# Patient Record
Sex: Male | Born: 1950 | Race: Black or African American | Hispanic: No | Marital: Single | Smoking: Former smoker
Health system: Southern US, Community
[De-identification: ages and names within clinical notes are randomized; demographics above are authoritative.]

## PROBLEM LIST (undated history)

## (undated) DIAGNOSIS — R188 Other ascites: Secondary | ICD-10-CM

## (undated) DIAGNOSIS — K922 Gastrointestinal hemorrhage, unspecified: Secondary | ICD-10-CM

## (undated) DIAGNOSIS — K219 Gastro-esophageal reflux disease without esophagitis: Secondary | ICD-10-CM

## (undated) DIAGNOSIS — I82890 Acute embolism and thrombosis of other specified veins: Secondary | ICD-10-CM

## (undated) DIAGNOSIS — F1020 Alcohol dependence, uncomplicated: Secondary | ICD-10-CM

## (undated) DIAGNOSIS — K7460 Unspecified cirrhosis of liver: Secondary | ICD-10-CM

## (undated) DIAGNOSIS — M25461 Effusion, right knee: Secondary | ICD-10-CM

## (undated) DIAGNOSIS — E119 Type 2 diabetes mellitus without complications: Secondary | ICD-10-CM

## (undated) DIAGNOSIS — Z8711 Personal history of peptic ulcer disease: Secondary | ICD-10-CM

## (undated) DIAGNOSIS — K56609 Unspecified intestinal obstruction, unspecified as to partial versus complete obstruction: Secondary | ICD-10-CM

## (undated) DIAGNOSIS — K635 Polyp of colon: Secondary | ICD-10-CM

## (undated) DIAGNOSIS — M1990 Unspecified osteoarthritis, unspecified site: Secondary | ICD-10-CM

## (undated) DIAGNOSIS — D649 Anemia, unspecified: Secondary | ICD-10-CM

## (undated) DIAGNOSIS — K859 Acute pancreatitis without necrosis or infection, unspecified: Secondary | ICD-10-CM

## (undated) DIAGNOSIS — Z8719 Personal history of other diseases of the digestive system: Secondary | ICD-10-CM

## (undated) DIAGNOSIS — I10 Essential (primary) hypertension: Secondary | ICD-10-CM

## (undated) HISTORY — PX: COLONOSCOPY: SHX174

## (undated) HISTORY — DX: Type 2 diabetes mellitus without complications: E11.9

---

## 1997-10-30 ENCOUNTER — Emergency Department (HOSPITAL_COMMUNITY): Admission: EM | Admit: 1997-10-30 | Discharge: 1997-10-30 | Payer: Self-pay | Admitting: Emergency Medicine

## 2005-11-04 DIAGNOSIS — K56609 Unspecified intestinal obstruction, unspecified as to partial versus complete obstruction: Secondary | ICD-10-CM

## 2005-11-04 HISTORY — DX: Unspecified intestinal obstruction, unspecified as to partial versus complete obstruction: K56.609

## 2005-11-14 ENCOUNTER — Inpatient Hospital Stay (HOSPITAL_COMMUNITY): Admission: EM | Admit: 2005-11-14 | Discharge: 2005-11-17 | Payer: Self-pay | Admitting: Emergency Medicine

## 2005-11-14 ENCOUNTER — Ambulatory Visit: Payer: Self-pay | Admitting: Family Medicine

## 2011-07-08 DIAGNOSIS — K7460 Unspecified cirrhosis of liver: Secondary | ICD-10-CM

## 2011-07-08 HISTORY — DX: Unspecified cirrhosis of liver: K74.60

## 2011-11-05 DIAGNOSIS — K859 Acute pancreatitis without necrosis or infection, unspecified: Secondary | ICD-10-CM

## 2011-11-05 HISTORY — DX: Acute pancreatitis without necrosis or infection, unspecified: K85.90

## 2011-11-22 ENCOUNTER — Encounter (HOSPITAL_COMMUNITY): Payer: Self-pay | Admitting: *Deleted

## 2011-11-22 ENCOUNTER — Emergency Department (HOSPITAL_COMMUNITY): Payer: Self-pay

## 2011-11-22 ENCOUNTER — Inpatient Hospital Stay (HOSPITAL_COMMUNITY)
Admission: EM | Admit: 2011-11-22 | Discharge: 2011-11-24 | DRG: 439 | Disposition: A | Discharge status: Home / Self Care | Payer: Self-pay | Attending: Internal Medicine | Admitting: Internal Medicine

## 2011-11-22 DIAGNOSIS — R7309 Other abnormal glucose: Secondary | ICD-10-CM | POA: Diagnosis present

## 2011-11-22 DIAGNOSIS — F1010 Alcohol abuse, uncomplicated: Secondary | ICD-10-CM | POA: Diagnosis present

## 2011-11-22 DIAGNOSIS — R1013 Epigastric pain: Secondary | ICD-10-CM

## 2011-11-22 DIAGNOSIS — K859 Acute pancreatitis without necrosis or infection, unspecified: Principal | ICD-10-CM | POA: Diagnosis present

## 2011-11-22 DIAGNOSIS — R1012 Left upper quadrant pain: Secondary | ICD-10-CM | POA: Diagnosis present

## 2011-11-22 DIAGNOSIS — E876 Hypokalemia: Secondary | ICD-10-CM | POA: Diagnosis present

## 2011-11-22 DIAGNOSIS — K863 Pseudocyst of pancreas: Secondary | ICD-10-CM | POA: Diagnosis present

## 2011-11-22 DIAGNOSIS — F17200 Nicotine dependence, unspecified, uncomplicated: Secondary | ICD-10-CM | POA: Diagnosis present

## 2011-11-22 DIAGNOSIS — D649 Anemia, unspecified: Secondary | ICD-10-CM | POA: Diagnosis present

## 2011-11-22 DIAGNOSIS — E871 Hypo-osmolality and hyponatremia: Secondary | ICD-10-CM | POA: Diagnosis present

## 2011-11-22 DIAGNOSIS — K862 Cyst of pancreas: Secondary | ICD-10-CM | POA: Diagnosis present

## 2011-11-22 DIAGNOSIS — R109 Unspecified abdominal pain: Secondary | ICD-10-CM | POA: Diagnosis present

## 2011-11-22 HISTORY — DX: Personal history of other diseases of the digestive system: Z87.19

## 2011-11-22 HISTORY — DX: Personal history of peptic ulcer disease: Z87.11

## 2011-11-22 LAB — LIPASE, BLOOD: Lipase: 670 U/L — ABNORMAL HIGH (ref 11–59)

## 2011-11-22 LAB — DIFFERENTIAL
Basophils Absolute: 0 10*3/uL (ref 0.0–0.1)
Lymphs Abs: 2 10*3/uL (ref 0.7–4.0)
Monocytes Absolute: 1 10*3/uL (ref 0.1–1.0)
Monocytes Relative: 10 % (ref 3–12)
Neutrophils Relative %: 68 % (ref 43–77)

## 2011-11-22 LAB — CBC
HCT: 42 % (ref 39.0–52.0)
Hemoglobin: 14.6 g/dL (ref 13.0–17.0)
MCH: 32.4 pg (ref 26.0–34.0)
MCV: 93.1 fL (ref 78.0–100.0)
WBC: 9.5 10*3/uL (ref 4.0–10.5)

## 2011-11-22 LAB — MAGNESIUM: Magnesium: 2.4 mg/dL (ref 1.5–2.5)

## 2011-11-22 LAB — URINALYSIS, ROUTINE W REFLEX MICROSCOPIC
Nitrite: NEGATIVE
Protein, ur: 30 mg/dL — AB
Urobilinogen, UA: 1 mg/dL (ref 0.0–1.0)

## 2011-11-22 LAB — COMPREHENSIVE METABOLIC PANEL
ALT: 29 U/L (ref 0–53)
CO2: 30 mEq/L (ref 19–32)
Chloride: 89 mEq/L — ABNORMAL LOW (ref 96–112)
Creatinine, Ser: 1.02 mg/dL (ref 0.50–1.35)
Glucose, Bld: 173 mg/dL — ABNORMAL HIGH (ref 70–99)
Potassium: 3.2 mEq/L — ABNORMAL LOW (ref 3.5–5.1)
Sodium: 133 mEq/L — ABNORMAL LOW (ref 135–145)
Total Protein: 7.7 g/dL (ref 6.0–8.3)

## 2011-11-22 LAB — PROTIME-INR: Prothrombin Time: 14.6 seconds (ref 11.6–15.2)

## 2011-11-22 LAB — URINE MICROSCOPIC-ADD ON

## 2011-11-22 LAB — APTT: aPTT: 34 seconds (ref 24–37)

## 2011-11-22 MED ORDER — SODIUM CHLORIDE 0.9 % IV SOLN: INTRAVENOUS | Status: DC

## 2011-11-22 MED ORDER — POTASSIUM CHLORIDE 10 MEQ/100ML IV SOLN
10.0000 meq | INTRAVENOUS | Status: AC
  Administered 2011-11-22 (×2): 10 meq via INTRAVENOUS
  Filled 2011-11-22 (×2): qty 100

## 2011-11-22 MED ORDER — PNEUMOCOCCAL VAC POLYVALENT 25 MCG/0.5ML IJ INJ
0.5000 mL | INJECTION | INTRAMUSCULAR | Status: AC
  Administered 2011-11-23: 0.5 mL via INTRAMUSCULAR
  Filled 2011-11-22: qty 0.5

## 2011-11-22 MED ORDER — ONDANSETRON HCL 4 MG PO TABS: 4.0000 mg | ORAL_TABLET | Freq: Four times a day (QID) | ORAL | Status: DC | PRN

## 2011-11-22 MED ORDER — ENOXAPARIN SODIUM 40 MG/0.4ML ~~LOC~~ SOLN
40.0000 mg | SUBCUTANEOUS | Status: DC
  Administered 2011-11-22 – 2011-11-23 (×2): 40 mg via SUBCUTANEOUS
  Filled 2011-11-22 (×3): qty 0.4

## 2011-11-22 MED ORDER — IOHEXOL 300 MG/ML  SOLN
100.0000 mL | Freq: Once | INTRAMUSCULAR | Status: AC | PRN
  Administered 2011-11-22: 100 mL via INTRAVENOUS

## 2011-11-22 MED ORDER — HYDROMORPHONE HCL PF 1 MG/ML IJ SOLN
1.0000 mg | Freq: Once | INTRAMUSCULAR | Status: AC
  Administered 2011-11-22: 1 mg via INTRAVENOUS
  Filled 2011-11-22: qty 1

## 2011-11-22 MED ORDER — SODIUM CHLORIDE 0.9 % IV SOLN
INTRAVENOUS | Status: DC
  Administered 2011-11-22: 125 mL via INTRAVENOUS

## 2011-11-22 MED ORDER — SODIUM CHLORIDE 0.9 % IV SOLN
INTRAVENOUS | Status: DC
  Administered 2011-11-22 – 2011-11-24 (×5): via INTRAVENOUS

## 2011-11-22 MED ORDER — ONDANSETRON HCL 4 MG/2ML IJ SOLN
4.0000 mg | Freq: Once | INTRAMUSCULAR | Status: AC
  Administered 2011-11-22: 4 mg via INTRAVENOUS
  Filled 2011-11-22: qty 2

## 2011-11-22 MED ORDER — HYDROMORPHONE HCL PF 1 MG/ML IJ SOLN
1.0000 mg | INTRAMUSCULAR | Status: DC | PRN
  Administered 2011-11-22: 1 mg via INTRAVENOUS
  Filled 2011-11-22: qty 1

## 2011-11-22 MED ORDER — ONDANSETRON HCL 4 MG/2ML IJ SOLN: 4.0000 mg | Freq: Four times a day (QID) | INTRAMUSCULAR | Status: DC | PRN

## 2011-11-22 MED ORDER — POTASSIUM CHLORIDE 10 MEQ/100ML IV SOLN
10.0000 meq | INTRAVENOUS | Status: AC
  Administered 2011-11-22 (×6): 10 meq via INTRAVENOUS
  Filled 2011-11-22 (×6): qty 100

## 2011-11-22 MED ORDER — ENOXAPARIN SODIUM 30 MG/0.3ML ~~LOC~~ SOLN: 30.0000 mg | SUBCUTANEOUS | Status: DC

## 2011-11-22 MED ORDER — HYDROCODONE-ACETAMINOPHEN 5-325 MG PO TABS
1.0000 | ORAL_TABLET | ORAL | Status: DC | PRN
  Administered 2011-11-23 – 2011-11-24 (×2): 1 via ORAL
  Filled 2011-11-22 (×2): qty 1

## 2011-11-22 MED ORDER — SODIUM CHLORIDE 0.9 % IV BOLUS (SEPSIS)
1000.0000 mL | Freq: Once | INTRAVENOUS | Status: AC
  Administered 2011-11-22: 1000 mL via INTRAVENOUS

## 2011-11-22 MED ORDER — ONDANSETRON HCL 4 MG/2ML IJ SOLN: 4.0000 mg | Freq: Three times a day (TID) | INTRAMUSCULAR | Status: DC | PRN

## 2011-11-22 MED ORDER — HYDROMORPHONE HCL PF 1 MG/ML IJ SOLN
1.0000 mg | INTRAMUSCULAR | Status: DC | PRN
  Administered 2011-11-22 (×2): 1 mg via INTRAVENOUS
  Filled 2011-11-22 (×3): qty 1

## 2011-11-22 NOTE — ED Notes (Signed)
Attempt to call report to 5E, available RN not able to take report at this time

## 2011-11-22 NOTE — H&P (Signed)
PCP:  No primary provider on file.   DOA:  11/22/2011  7:41 AM  Chief Complaint:  Abdominal pain  HPI: Pt is 61 yo male who presents to [facility] ED with main concern of progressively worsening, sharp, intermittent, mostly in the LUQ area abdominal pain that initially started 3 - 4 days prior to admission. Pt reports the pain is 7/10 in severity and radiating to the back area, with no specific aggravating or alleviating factors. Pt denies any fever, chills, urinary concerns. He does endorse associated nausea and intermittent episodes of non blood vomiting, poor oral intake. He denies diarrhea or blood in urine or stool. Pt denies any chest pain or shortness of breath, recent sicknesses or hospitalizations, no similar episodes in the recent past, no other systemic symptoms such as weigh loss or gain, night sweats, no recent sick contacts or exposures. Denies use of alcohol.  Allergies: No Known Allergies  Prior to Admission medications   Medication Sig Start Date End Date Taking? Authorizing Provider  magnesium citrate 1.745 GM/30ML SOLN Take 1 Bottle by mouth once.   Yes Historical Provider, MD    Past Medical History  Diagnosis Date  . Intestine disorder     intestinal blockage  . History of stomach ulcers     History reviewed. No pertinent past surgical history.  Social History:  reports that he has been smoking Cigarettes.  He has been smoking about .5 packs per day. He has never used smokeless tobacco. He reports that he drinks alcohol. He reports that he does not use illicit drugs.  History reviewed. No pertinent family history.  Review of Systems:  Constitutional: Denies fever, chills, diaphoresis.  HEENT: Denies photophobia, eye pain, redness, hearing loss, ear pain, congestion, sore throat, rhinorrhea, sneezing, mouth sores, trouble swallowing, neck pain, neck stiffness and tinnitus.   Respiratory: Denies SOB, DOE, cough, chest tightness,  and wheezing.   Cardiovascular: Denies  chest pain, palpitations and leg swelling.  Gastrointestinal: per HPI Genitourinary: Denies dysuria, urgency, frequency, hematuria, flank pain and difficulty urinating.  Musculoskeletal: Denies myalgias, back pain, joint swelling, arthralgias and gait problem.  Skin: Denies pallor, rash and wound.  Neurological: Denies dizziness, seizures, syncope, weakness, light-headedness, numbness and headaches.  Hematological: Denies adenopathy. Easy bruising, personal or family bleeding history  Psychiatric/Behavioral: Denies suicidal ideation, mood changes, confusion, nervousness, sleep disturbance and agitation  Physical Exam:  Filed Vitals:   11/22/11 0744 11/22/11 1126 11/22/11 1200  BP: 120/78 154/83 159/90  Pulse: 78 64 65  Temp: 97.4 F (36.3 C) 98.7 F (37.1 C) 98.1 F (36.7 C)  TempSrc: Oral Oral Oral  Resp: 20 16 18   Height: 6' (1.829 m)  6' (1.829 m)  Weight: 74.844 kg (165 lb)  75 kg (165 lb 5.5 oz)  SpO2: 100% 99% 99%    Constitutional: Vital signs reviewed.  Patient is in no acute distress and cooperative with exam. Alert and oriented x3.  Head: Normocephalic and atraumatic Ear: TM normal bilaterally Mouth: no erythema or exudates, MMM Eyes: PERRL, EOMI, conjunctivae normal, No scleral icterus.  Neck: Supple, Trachea midline normal ROM, No JVD, mass, thyromegaly, or carotid bruit present.  Cardiovascular: RRR, S1 normal, S2 normal, no MRG, pulses symmetric and intact bilaterally Pulmonary/Chest: CTAB, no wheezes, rales, or rhonchi Abdominal: Soft. Tender in epigastric area, non-distended, bowel sounds are normal, no masses, organomegaly, or guarding present.  GU: no CVA tenderness Musculoskeletal: No joint deformities, erythema, or stiffness, ROM full and no nontender Ext: no edema and no cyanosis,  pulses palpable bilaterally (DP and PT) Hematology: no cervical, inginal, or axillary adenopathy.  Neurological: A&O x3, Strenght is normal and symmetric bilaterally, cranial  nerve II-XII are grossly intact, no focal motor deficit, sensory intact to light touch bilaterally.  Skin: Warm, dry and intact. No rash, cyanosis, or clubbing.  Psychiatric: Normal mood and affect. speech and behavior is normal. Judgment and thought content normal. Cognition and memory are normal.   Labs on Admission:  Results for orders placed during the hospital encounter of 11/22/11 (from the past 48 hour(s))  CBC     Status: Normal   Collection Time   11/22/11  8:07 AM      Component Value Range Comment   WBC 9.5  4.0 - 10.5 (K/uL)    RBC 4.51  4.22 - 5.81 (MIL/uL)    Hemoglobin 14.6  13.0 - 17.0 (g/dL)    HCT 04.5  40.9 - 81.1 (%)    MCV 93.1  78.0 - 100.0 (fL)    MCH 32.4  26.0 - 34.0 (pg)    MCHC 34.8  30.0 - 36.0 (g/dL)    RDW 91.4  78.2 - 95.6 (%)    Platelets 358  150 - 400 (K/uL)   DIFFERENTIAL     Status: Normal   Collection Time   11/22/11  8:07 AM      Component Value Range Comment   Neutrophils Relative 68  43 - 77 (%)    Neutro Abs 6.5  1.7 - 7.7 (K/uL)    Lymphocytes Relative 21  12 - 46 (%)    Lymphs Abs 2.0  0.7 - 4.0 (K/uL)    Monocytes Relative 10  3 - 12 (%)    Monocytes Absolute 1.0  0.1 - 1.0 (K/uL)    Eosinophils Relative 0  0 - 5 (%)    Eosinophils Absolute 0.0  0.0 - 0.7 (K/uL)    Basophils Relative 0  0 - 1 (%)    Basophils Absolute 0.0  0.0 - 0.1 (K/uL)   COMPREHENSIVE METABOLIC PANEL     Status: Abnormal   Collection Time   11/22/11  8:07 AM      Component Value Range Comment   Sodium 133 (*) 135 - 145 (mEq/L)    Potassium 3.2 (*) 3.5 - 5.1 (mEq/L)    Chloride 89 (*) 96 - 112 (mEq/L)    CO2 30  19 - 32 (mEq/L)    Glucose, Bld 173 (*) 70 - 99 (mg/dL)    BUN 15  6 - 23 (mg/dL)    Creatinine, Ser 2.13  0.50 - 1.35 (mg/dL)    Calcium 9.3  8.4 - 10.5 (mg/dL)    Total Protein 7.7  6.0 - 8.3 (g/dL)    Albumin 4.3  3.5 - 5.2 (g/dL)    AST 20  0 - 37 (U/L)    ALT 29  0 - 53 (U/L)    Alkaline Phosphatase 111  39 - 117 (U/L)    Total Bilirubin 1.5  (*) 0.3 - 1.2 (mg/dL)    GFR calc non Af Amer 78 (*) >90 (mL/min)    GFR calc Af Amer >90  >90 (mL/min)   LIPASE, BLOOD     Status: Abnormal   Collection Time   11/22/11  8:07 AM      Component Value Range Comment   Lipase 670 (*) 11 - 59 (U/L)   URINALYSIS, ROUTINE W REFLEX MICROSCOPIC     Status: Abnormal   Collection Time  11/22/11  8:14 AM      Component Value Range Comment   Color, Urine AMBER (*) YELLOW  BIOCHEMICALS MAY BE AFFECTED BY COLOR   APPearance CLOUDY (*) CLEAR     Specific Gravity, Urine 1.028  1.005 - 1.030     pH 7.0  5.0 - 8.0     Glucose, UA NEGATIVE  NEGATIVE (mg/dL)    Hgb urine dipstick NEGATIVE  NEGATIVE     Bilirubin Urine LARGE (*) NEGATIVE     Ketones, ur 15 (*) NEGATIVE (mg/dL)    Protein, ur 30 (*) NEGATIVE (mg/dL)    Urobilinogen, UA 1.0  0.0 - 1.0 (mg/dL)    Nitrite NEGATIVE  NEGATIVE     Leukocytes, UA TRACE (*) NEGATIVE    URINE MICROSCOPIC-ADD ON     Status: Normal   Collection Time   11/22/11  8:14 AM      Component Value Range Comment   Squamous Epithelial / LPF RARE  RARE     WBC, UA 0-2  <3 (WBC/hpf)    RBC / HPF 0-2  <3 (RBC/hpf)    Bacteria, UA RARE  RARE     Urine-Other MUCOUS PRESENT     LACTIC ACID, PLASMA     Status: Normal   Collection Time   11/22/11  8:26 AM      Component Value Range Comment   Lactic Acid, Venous 1.4  0.5 - 2.2 (mmol/L)     Radiological Exams on Admission:  CT abdomen and pelvis: 11/21/2011 1. Acute inflammatory process around the head of the pancreas and the second portion of the duodenum. There is fluid around the second portion of the duodenum and pancreatic head.  Diagnostic possibilities include acute on chronic pancreatitis with secondary inflammation of the duodenum, walled off perforated duodenal ulcer, and duodenal tumor.  2. 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  Assessment/Plan  Abdominal Pain - secondary too acute  pancreatitis - will admit the patient to medical floor for further evaluation and management - provide supportive care with IVF, antiemetics and analgesia as needed for adequate pain control - keep NPO for now and advance diet as tolerated - supplement electrolytes as indicated  Hypokalemia - check magnesium level and supplement as indicated  Hyponatremia - pre renal etiology  - continue IVF and check BMP in AM  Hyperglycemia - check A1C  DVT Prophylaxis - Lovenox  Code Status - Full  Education  - test results and diagnostic studies were discussed with patient  - patient verbalized the understanding - questions were answered at the bedside and contact information was provided for additional questions or concerns  Time Spent on Admission: Over 30 minutes  MAGICK-MYERS, ISKRA 11/22/2011, 4:08 PM  [facility] Pager # [phone] Main Office # [phone]

## 2011-11-22 NOTE — ED Notes (Signed)
Pt from home with reports of right middle abdominal pain and vomiting that started yesterday evening. Pt reports similar symptoms a few years ago with hospitalization for 3 days with intestinal blockage.

## 2011-11-22 NOTE — ED Provider Notes (Signed)
History     CSN: [id number]  Arrival date & time 11/22/11  0740   First MD Initiated Contact with Patient 11/22/11 [phone]      Chief Complaint  Patient presents with  . Abdominal Pain    right  . Emesis    (Consider location/radiation/quality/duration/timing/severity/associated sxs/prior treatment) HPI Comments: Patient states he has similar episode 3 years ago at which time he had an intestinal blockage requiring admission  Patient is a 61 y.o. male presenting with abdominal pain. The history is provided by the patient. No language interpreter was used.  Abdominal Pain The primary symptoms of the illness include abdominal pain, nausea and vomiting. The primary symptoms of the illness do not include fever, fatigue, shortness of breath, diarrhea or dysuria. The current episode started yesterday. The onset of the illness was gradual. The problem has been gradually worsening.  The abdominal pain began yesterday. The pain came on gradually. The abdominal pain has been gradually worsening since its onset. The abdominal pain is located in the periumbilical region. The abdominal pain does not radiate. The abdominal pain is relieved by nothing. The abdominal pain is exacerbated by vomiting.  Nausea began yesterday. The nausea is associated with eating. The nausea is exacerbated by food.  The vomiting began yesterday. Vomiting occurs 6 to 10 times per day. The emesis contains stomach contents.  Symptoms associated with the illness do not include chills, constipation, urgency, frequency or back pain.    Past Medical History  Diagnosis Date  . Intestine disorder     intestinal blockage  . History of stomach ulcers     History reviewed. No pertinent past surgical history.  History reviewed. No pertinent family history.  History  Substance Use Topics  . Smoking status: Current Everyday Smoker -- 0.5 packs/day    Types: Cigarettes  . Smokeless tobacco: Never Used  . Alcohol Use: Yes   daily      Review of Systems  Constitutional: Negative for fever, chills, activity change, appetite change and fatigue.  HENT: Negative for congestion, sore throat, rhinorrhea, neck pain and neck stiffness.   Respiratory: Negative for cough and shortness of breath.   Cardiovascular: Negative for chest pain and palpitations.  Gastrointestinal: Positive for nausea, vomiting and abdominal pain. Negative for diarrhea, constipation and blood in stool.  Genitourinary: Negative for dysuria, urgency, frequency and flank pain.  Musculoskeletal: Negative for myalgias, back pain and arthralgias.  Neurological: Negative for dizziness, weakness, light-headedness, numbness and headaches.  All other systems reviewed and are negative.    Allergies  Review of patient's allergies indicates no known allergies.  Home Medications   Current Outpatient Rx  Name Route Sig Dispense Refill  . MAGNESIUM CITRATE 1.745 GM/30ML PO SOLN Oral Take 1 Bottle by mouth once.      BP 120/78  Pulse 78  Temp(Src) 97.4 F (36.3 C) (Oral)  Resp 20  Ht 6' (1.829 m)  Wt 165 lb (74.844 kg)  BMI 22.38 kg/m2  SpO2 100%  Physical Exam  Nursing note and vitals reviewed. Constitutional: He is oriented to person, place, and time. He appears well-developed and well-nourished. No distress.  HENT:  Head: Normocephalic and atraumatic.  Mouth/Throat: Oropharynx is clear and moist.  Eyes: Conjunctivae and EOM are normal. Pupils are equal, round, and reactive to light.  Neck: Normal range of motion. Neck supple.  Cardiovascular: Normal rate, regular rhythm, normal heart sounds and intact distal pulses.  Exam reveals no gallop and no friction rub.   No murmur  heard. Pulmonary/Chest: Effort normal and breath sounds normal. No respiratory distress. He exhibits no tenderness.  Abdominal: Soft. Bowel sounds are normal. There is tenderness (periumbilical abdominal pain on palpation). There is no rebound and no guarding.    Musculoskeletal: Normal range of motion. He exhibits no tenderness.  Neurological: He is alert and oriented to person, place, and time. No cranial nerve deficit.  Skin: Skin is warm and dry. No rash noted.    ED Course  Procedures (including critical care time)  Labs Reviewed  COMPREHENSIVE METABOLIC PANEL - Abnormal; Notable for the following:    Sodium 133 (*)    Potassium 3.2 (*)    Chloride 89 (*)    Glucose, Bld 173 (*)    Total Bilirubin 1.5 (*)    GFR calc non Af Amer 78 (*)    All other components within normal limits  LIPASE, BLOOD - Abnormal; Notable for the following:    Lipase 670 (*)    All other components within normal limits  URINALYSIS, ROUTINE W REFLEX MICROSCOPIC - Abnormal; Notable for the following:    Color, Urine AMBER (*) BIOCHEMICALS MAY BE AFFECTED BY COLOR   APPearance CLOUDY (*)    Bilirubin Urine LARGE (*)    Ketones, ur 15 (*)    Protein, ur 30 (*)    Leukocytes, UA TRACE (*)    All other components within normal limits  CBC  DIFFERENTIAL  LACTIC ACID, PLASMA  URINE MICROSCOPIC-ADD ON   Ct Abdomen Pelvis W Contrast  11/22/2011  *RADIOLOGY REPORT*  Clinical Data: Abdominal pain and vomiting.  CT ABDOMEN AND PELVIS WITH CONTRAST  Technique:  Multidetector CT imaging of the abdomen and pelvis was performed following the standard protocol during bolus administration of intravenous contrast.  Contrast: OMNIPAQUE IOHEXOL 300 MG/ML  SOLN  Comparison: CT scan dated 11/13/2005  Findings: Since the prior exam the patient has developed chronic calcific pancreatitis.  There is a complex 3 cm cystic lesion in the head of the pancreas with a soft tissue component centrally.  I suspect this is a complex pseudocyst.  In addition, there is marked edema and abnormal enhancement in the second portion of the duodenum.  I suspect the patient has either has a walled off perforated duodenal ulcer, tumor, or acute on chronic pancreatitis which is causing inflammation  of the second portion of the duodenum.  There is dilatation of the pancreatic duct.  No significant biliary ductal dilatation.  Gallbladder appears normal.  No significant abnormality of the liver.  There are a few tiny cysts in the liver.  Gallbladder is normal.  Spleen, adrenal glands, and kidneys are normal except for a benign appearing 15 mm cyst in the lower pole of the right kidney.  Small bowel beyond the duodenum and the colon appear normal including the terminal ileum and appendix.  There is slight enlargement of the prostate gland.  No significant osseous abnormality.  IMPRESSION:  1.  Acute inflammatory process around the head of the pancreas and the second portion of the duodenum.  There is fluid around the second portion of the duodenum and pancreatic head.  Diagnostic possibilities include acute on chronic pancreatitis with secondary inflammation of the duodenum, walled off perforated duodenal ulcer, and duodenal tumor. 2.  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  Original Report Authenticated By: Gwynn Burly, M.D.     1. Acute pancreatitis       MDM  Acute pancreatitis with a lipase of 670. There is evidence on CT of the same. There is no evidence of intestinal obstruction. He received antiemetics, pain medication, IV fluids. There is no LFTs are abnormal therefore no ultrasound as indicated. He will be admitted for bowel rest. He will be n.p.o. and receive additional IV fluids.  Discussed with [facility]        Dayton Bailiff, MD 11/22/11 1011

## 2011-11-23 DIAGNOSIS — K859 Acute pancreatitis without necrosis or infection, unspecified: Secondary | ICD-10-CM

## 2011-11-23 DIAGNOSIS — R1013 Epigastric pain: Secondary | ICD-10-CM

## 2011-11-23 DIAGNOSIS — E876 Hypokalemia: Secondary | ICD-10-CM

## 2011-11-23 LAB — COMPREHENSIVE METABOLIC PANEL
ALT: 17 U/L (ref 0–53)
Alkaline Phosphatase: 78 U/L (ref 39–117)
CO2: 28 mEq/L (ref 19–32)
Calcium: 8.6 mg/dL (ref 8.4–10.5)
Chloride: 96 mEq/L (ref 96–112)
GFR calc Af Amer: >90 mL/min (ref >90)
GFR calc non Af Amer: >90 mL/min (ref >90)
Glucose, Bld: 81 mg/dL (ref 70–99)
Sodium: 133 mEq/L — ABNORMAL LOW (ref 135–145)
Total Bilirubin: 1 mg/dL (ref 0.3–1.2)

## 2011-11-23 LAB — CBC
Hemoglobin: 11.7 g/dL — ABNORMAL LOW (ref 13.0–17.0)
MCHC: 33.3 g/dL (ref 30.0–36.0)
MCV: 94.4 fL (ref 78.0–100.0)
Platelets: 268 10*3/uL (ref 150–400)
Platelets: 253 10*3/uL (ref 150–400)
RBC: 3.72 MIL/uL — ABNORMAL LOW (ref 4.22–5.81)
RDW: 13.4 % (ref 11.5–15.5)
WBC: 6.9 10*3/uL (ref 4.0–10.5)

## 2011-11-23 MED ORDER — POTASSIUM CHLORIDE CRYS ER 20 MEQ PO TBCR
40.0000 meq | EXTENDED_RELEASE_TABLET | Freq: Once | ORAL | Status: AC
  Administered 2011-11-23: 40 meq via ORAL
  Filled 2011-11-23: qty 2

## 2011-11-23 NOTE — Progress Notes (Signed)
Patient ID: Alexander Schmitt, male   DOB: 1950/08/11, 61 y.o.   MRN: [id number]  Assessment/Plan   Abdominal Pain  - secondary too acute pancreatitis  - advanced diet today to regular  Hypokalemia  - likely secondary to GI losses - repleted today again and we will check BMP in am  Anemia - hemoglobin dropped from ~14.6 to 11.7 - rechecked CBC to make sure this is not lab error - follow up CBC in am  Hyponatremia  - pre renal etiology   Hyperglycemia  - check A1C   DVT Prophylaxis - Lovenox   Code Status - Full   Education  - test results and diagnostic studies were discussed with patient  - patient verbalized the understanding  - questions were answered at the bedside and contact information was provided for additional questions or concerns    Subjective: No events overnight.   Objective:  Vital signs in last 24 hours:  Filed Vitals:   11/22/11 1921 11/22/11 2227 11/23/11 0643 11/23/11 1318  BP: 132/74 137/85 165/89 128/76  Pulse:  62 63 68  Temp:  98.4 F (36.9 C) 98.2 F (36.8 C) 98.8 F (37.1 C)  TempSrc:  Oral Oral Oral  Resp:  18 18 18   Height:      Weight:   75 kg (165 lb 5.5 oz)   SpO2:  95% 99% 97%    Intake/Output from previous day:   Intake/Output Summary (Last 24 hours) at 11/23/11 1715 Last data filed at 11/23/11 1253  Gross per 24 hour  Intake 1193.33 ml  Output      0 ml  Net 1193.33 ml    Physical Exam: General: Alert, awake, oriented x3, in no acute distress. HEENT: No bruits, no goiter. Moist mucous membranes, no scleral icterus, no conjunctival pallor. Heart: Regular rate and rhythm, S1/S2 +, no murmurs, rubs, gallops. Lungs: Clear to auscultation bilaterally. No wheezing, no rhonchi, no rales.  Abdomen: Soft, nontender, nondistended, positive bowel sounds. Extremities: No clubbing or cyanosis, no pitting edema,  positive pedal pulses. Neuro: Grossly nonfocal.  Lab Results:  Lab 11/23/11 1033 11/23/11 0535 11/22/11 0807  WBC  6.9 8.0 9.5  HGB 11.6* 11.7* 14.6  HCT 35.1* 35.1* 42.0  PLT 253 268 358  MCV 94.4 94.6 93.1    Lab 11/23/11 0535 11/22/11 1635 11/22/11 0807  NA 133* -- 133*  K 3.4* -- 3.2*  CL 96 -- 89*  CO2 28 -- 30  GLUCOSE 81 -- 173*  BUN 8 -- 15  CREATININE 0.82 -- 1.02  CALCIUM 8.6 -- 9.3  MG -- 2.4 --    Lab 11/22/11 1635  INR 1.12  PROTIME --    Studies/Results: Ct Abdomen Pelvis W Contrast 11/22/2011  *RADIOLOGY REPORT*  Clinical Data: Abdominal pain and vomiting.  CT ABDOMEN AND PELVIS WITH CONTRAST  Technique:  Multidetector CT imaging of the abdomen and pelvis was performed following the standard protocol during bolus administration of intravenous contrast.  Contrast: OMNIPAQUE IOHEXOL 300 MG/ML  SOLN  Comparison: CT scan dated 11/13/2005  Findings: Since the prior exam the patient has developed chronic calcific pancreatitis.  There is a complex 3 cm cystic lesion in the head of the pancreas with a soft tissue component centrally.  I suspect this is a complex pseudocyst.  In addition, there is marked edema and abnormal enhancement in the second portion of the duodenum.  I suspect the patient has either has a walled off perforated duodenal ulcer, tumor, or acute  on chronic pancreatitis which is causing inflammation of the second portion of the duodenum.  There is dilatation of the pancreatic duct.  No significant biliary ductal dilatation.  Gallbladder appears normal.  No significant abnormality of the liver.  There are a few tiny cysts in the liver.  Gallbladder is normal.  Spleen, adrenal glands, and kidneys are normal except for a benign appearing 15 mm cyst in the lower pole of the right kidney.  Small bowel beyond the duodenum and the colon appear normal including the terminal ileum and appendix.  There is slight enlargement of the prostate gland.  No significant osseous abnormality.  IMPRESSION:  1.  Acute inflammatory process around the head of the pancreas and the second portion of  the duodenum.  There is fluid around the second portion of the duodenum and pancreatic head.  Diagnostic possibilities include acute on chronic pancreatitis with secondary inflammation of the duodenum, walled off perforated duodenal ulcer, and duodenal tumor. 2.  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  Original Report Authenticated By: Gwynn Burly, M.D.    Medications: Scheduled Meds:   . enoxaparin (LOVENOX) injection  40 mg Subcutaneous Q24H  . pneumococcal 23 valent vaccine  0.5 mL Intramuscular Tomorrow-1000  . potassium chloride  10 mEq Intravenous Q1 Hr x 6  . potassium chloride  40 mEq Oral Once   Continuous Infusions:   . sodium chloride 125 mL/hr at 11/23/11 0536   PRN Meds:.HYDROcodone-acetaminophen, HYDROmorphone (DILAUDID) injection, ondansetron (ZOFRAN) IV, ondansetron   LOS: 1 day   Aleera Gilcrease 11/23/2011, 5:15 PM  [facility] Pager: [phone]

## 2011-11-24 DIAGNOSIS — E876 Hypokalemia: Secondary | ICD-10-CM

## 2011-11-24 DIAGNOSIS — R1013 Epigastric pain: Secondary | ICD-10-CM

## 2011-11-24 DIAGNOSIS — K859 Acute pancreatitis without necrosis or infection, unspecified: Secondary | ICD-10-CM

## 2011-11-24 LAB — GLUCOSE, CAPILLARY

## 2011-11-24 LAB — CBC
MCV: 93 fL (ref 78.0–100.0)
Platelets: 259 10*3/uL (ref 150–400)
RBC: 3.71 MIL/uL — ABNORMAL LOW (ref 4.22–5.81)
RDW: 13 % (ref 11.5–15.5)
WBC: 6.7 10*3/uL (ref 4.0–10.5)

## 2011-11-24 MED ORDER — THERA VITAL M PO TABS: 1.0000 | ORAL_TABLET | Freq: Every day | ORAL | Status: DC

## 2011-11-24 MED ORDER — HYDROCODONE-ACETAMINOPHEN 5-325 MG PO TABS: 1.0000 | ORAL_TABLET | ORAL | Status: AC | PRN

## 2011-11-24 NOTE — Discharge Instructions (Signed)
Acute Pancreatitis The pancreas is a large gland located behind your stomach. It produces (secretes) enzymes. These enzymes help digest food. It also releases the hormones glucagon and insulin. These hormones help regulate blood sugar. When the pancreas becomes inflamed, the disease is called pancreatitis. Inflammation of the pancreas occurs when enzymes from the pancreas begin attacking and digesting the pancreas. CAUSES  Most cases ofsudden onset (acute) pancreatitis are caused by:  Alcohol abuse.   Gallstones.  Other less common causes are:  Some medications.   Exposure to certain chemicals   Infection.   Damage caused by an accident (trauma).   Surgery of the belly (abdomen).  SYMPTOMS  Acute pancreatitis usually begins with pain in the upper abdomen and may radiate to the back. This pain may last a couple days. The constant pain varies from mild to severe. The acute form of this disease may vary from mild, nonspecific abdominal pain to profound shock with coma. About 1 in 5 cases are severe. These patients become dehydrated and develop low blood pressure. In severe cases, bleeding into the pancreas can lead to shock and death. The lungs, heart, and kidneys may fail. DIAGNOSIS  Your caregiver will form a clinical opinion after giving you an exam. Laboratory work is used to confirm this diagnosis. Often,a digestive enzyme from the pancreas (serum amylase) and other enzymes are elevated. Sugars and fats (lipids) in the blood may be elevated. There may also be changes in the following levels: calcium, magnesium, potassium, chloride and bicarbonate (chemicals in the blood). X-rays, a CT scan, or ultrasound of your abdomen may be necessary to search for other causes of your abdominal pain. TREATMENT  Most pancreatitis requires treatment of symptoms. Most acute attacks last a couple of days. Your caregiver can discuss the treatment options with you.  If complications occur, hospitalization  may be necessary for pain control and intravenous (IV) fluid replacement.   Sometimes, a tube may be put into the stomach to control vomiting.   Food may not be allowed for 3 to 4 days. This gives the pancreas time to rest. Giving the pancreas a rest means there is no stimulation that would produce more enzymes and cause more damage.   Medicines (antibiotics) that kill germs may be given if infection is the cause.   Sometimes, surgery may be required.   Following an acute attack, your caregiver will determine the cause, if possible, and offer suggestions to prevent recurrences.  HOME CARE INSTRUCTIONS   Eat smaller, more frequent meals. This reduces the amount of digestive juices the pancreas produces.   Decrease the amount of fat in your diet. This may help reduce loose, diarrheal stools.   Drink enough water and fluids to keep your urine clear or pale yellow. This is to avoid dehydration which can cause increased pain.   Talk to your caregiver about pain relievers or other medicines that may help.   Avoid anything that may have triggered your pancreatitis (for example, alcohol).   Follow the diet advised by your caregiver. Do not advance the diet too soon.   Take medicines as prescribed.   Get plenty of rest.   Check your blood sugar at home as directed by your caregiver.   If your caregiver has given you a follow-up appointment, it is very important to keep that appointment. Not keeping the appointment could result in a lasting (chronic) or permanent injury, pain, and disability. If there is any problem keeping the appointment, you must call to reschedule.    SEEK MEDICAL CARE IF:   You are not recovering in the time described by your caregiver.   You have persistent pain, weakness, or feel sick to your stomach (nauseous).   You have recovered and then have another bout of pain.  SEEK IMMEDIATE MEDICAL CARE IF:   You are unable to eat or keep fluids down.   Your pain  increases a lot or changes.   You have an oral temperature above 102 F (38.9 C), not controlled by medicine.   Your skin or the white part of your eyes look yellow (jaundice).   You develop vomiting.   You feel dizzy or faint.   Your blood sugar is high (over 300).  MAKE SURE YOU:   Understand these instructions.   Will watch your condition.   Will get help right away if you are not doing well or get worse.  Document Released: 06/23/2005 Document Revised: 06/12/2011 Document Reviewed: 02/04/2008 [employer] Patient Information 2012 [employer]. 

## 2011-11-24 NOTE — Discharge Summary (Signed)
Patient ID: LEMOYNE NESTOR MRN: [id number] DOB/AGE: Mar 30, 1951 61 y.o.  Admit date: 11/22/2011 Discharge date: 11/24/2011  Primary Care Physician:  No primary provider on file.  Assessment/Plan   Abdominal Pain  - secondary too acute pancreatitis or pancreatic pseudocyst secondary to alcohol abuse - CT scan done 11/22/2011 shows 1.  Acute inflammatory process around the head of the pancreas and the second portion of the duodenum.  There is fluid around the second portion of the duodenum and pancreatic head.  Diagnostic possibilities include acute on chronic pancreatitis with secondary inflammation of the duodenum, walled off perforated duodenal ulcer, and duodenal tumor. 2.  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   - I spoke with Dr. Dulce Sellar of [facility] and recommendation was that the patient can follow up in GI clinic in 1 week for evaluation. At this time patient has no complaints of abdominal pain, no fever at all on this admission and no elevated WBC count - patient tolerated solid food very well today and yesterday and insists on leaving today - we will schedule appointment for [facility]  Hypokalemia  - likely secondary to GI losses  - repleted 11/23/2011  Anemia  - hemoglobin dropped from ~14.6 to 11.7  - rechecked CBC to make sure this is not lab error  - hemoglobin stable  Hyponatremia  - pre renal etiology   Hyperglycemia  - check A1C 5.6  DVT Prophylaxis - Lovenox while inpatient  Code Status - Full   Education  - test results and diagnostic studies were discussed with patient  - patient verbalized the understanding  - questions were answered at the bedside and contact information was provided for additional questions or concerns   Disposition - medically stable and clinically appears well for discharge  Consults: 1. Gastroenterology - phone call    Medication List  As of 11/24/2011 11:37 AM     TAKE these medications         HYDROcodone-acetaminophen 5-325 MG per tablet   Commonly known as: NORCO   Take 1-2 tablets by mouth every 4 (four) hours as needed.      magnesium citrate 1.745 GM/30ML Soln   Take 1 Bottle by mouth once.      multivitamin tablet   Take 1 tablet by mouth daily.             Significant Diagnostic Studies:  Ct Abdomen Pelvis W Contrast 11/22/2011    IMPRESSION:  1.  Acute inflammatory process around the head of the pancreas and the second portion of the duodenum.  There is fluid around the second portion of the duodenum and pancreatic head.  Diagnostic possibilities include acute on chronic pancreatitis with secondary inflammation of the duodenum, walled off perforated duodenal ulcer, and duodenal tumor. 2.  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     Brief H and P: 61 yo male who presents to [facility] ED with main concern of progressively worsening, sharp, intermittent, LUQ  abdominal pain that started 3 - 4 days prior to admission. Pt reported the pain was 7/10 in severity and radiating to the back area, with no specific aggravating or alleviating factors. Pt denied any fever, chills, urinary concerns. He did  endorse associated nausea and intermittent episodes of non blood vomiting and poor oral intake.   Physical Exam on Discharge:  Filed Vitals:   11/23/11 1318 11/23/11 2223 11/24/11 0500  11/24/11 0646  BP: 128/76 159/69  165/91  Pulse: 68 65  67  Temp: 98.8 F (37.1 C) 98.8 F (37.1 C)  98.4 F (36.9 C)  TempSrc: Oral Oral  Oral  Resp: 18 18  20   Height:      Weight:   72.9 kg (160 lb 11.5 oz)   SpO2: 97% 96%  98%    Intake/Output Summary (Last 24 hours) at 11/24/11 1137 Last data filed at 11/24/11 0853  Gross per 24 hour  Intake   1719 ml  Output      0 ml  Net   1719 ml    General: Alert, awake, oriented x3, in no acute distress. HEENT: No bruits, no  goiter. Heart: Regular rate and rhythm, without murmurs, rubs, gallops. Lungs: Clear to auscultation bilaterally. Abdomen: Soft, nontender, nondistended, positive bowel sounds. Extremities: No clubbing cyanosis or edema with positive pedal pulses. Neuro: Grossly intact, nonfocal.  CBC:    Component Value Date/Time   WBC 6.7 11/24/2011 1015   HGB 11.6* 11/24/2011 1015   HCT 34.5* 11/24/2011 1015   PLT 259 11/24/2011 1015   MCV 93.0 11/24/2011 1015   NEUTROABS 6.5 11/22/2011 0807   LYMPHSABS 2.0 11/22/2011 0807   MONOABS 1.0 11/22/2011 0807   EOSABS 0.0 11/22/2011 0807   BASOSABS 0.0 11/22/2011 0807    Basic Metabolic Panel:    Component Value Date/Time   NA 133* 11/23/2011 0535   K 3.4* 11/23/2011 0535   CL 96 11/23/2011 0535   CO2 28 11/23/2011 0535   BUN 8 11/23/2011 0535   CREATININE 0.82 11/23/2011 0535   GLUCOSE 81 11/23/2011 0535   CALCIUM 8.6 11/23/2011 0535    Time spent on Discharge: 60 minutes  Signed: Pricsilla Lindvall 11/24/2011, 11:37 AM

## 2011-12-24 ENCOUNTER — Encounter (HOSPITAL_COMMUNITY): Admission: RE | Payer: Self-pay | Source: Ambulatory Visit

## 2011-12-24 ENCOUNTER — Ambulatory Visit (HOSPITAL_COMMUNITY): Admission: RE | Admit: 2011-12-24 | Payer: Self-pay | Source: Ambulatory Visit | Admitting: Gastroenterology

## 2011-12-24 SURGERY — EGD (ESOPHAGOGASTRODUODENOSCOPY)
Anesthesia: Monitor Anesthesia Care

## 2011-12-24 SURGERY — ESOPHAGEAL ENDOSCOPIC ULTRASOUND (EUS) RADIAL
Anesthesia: Monitor Anesthesia Care

## 2012-01-03 ENCOUNTER — Encounter (HOSPITAL_COMMUNITY): Payer: Self-pay | Admitting: *Deleted

## 2012-01-03 ENCOUNTER — Emergency Department (HOSPITAL_COMMUNITY)
Admission: EM | Admit: 2012-01-03 | Discharge: 2012-01-04 | Disposition: A | Discharge status: Home / Self Care | Payer: Self-pay | Attending: Emergency Medicine | Admitting: Emergency Medicine

## 2012-01-03 DIAGNOSIS — K859 Acute pancreatitis without necrosis or infection, unspecified: Secondary | ICD-10-CM | POA: Insufficient documentation

## 2012-01-03 DIAGNOSIS — R112 Nausea with vomiting, unspecified: Secondary | ICD-10-CM | POA: Insufficient documentation

## 2012-01-03 DIAGNOSIS — R109 Unspecified abdominal pain: Secondary | ICD-10-CM | POA: Insufficient documentation

## 2012-01-03 LAB — CBC
HCT: 39.6 % (ref 39.0–52.0)
Hemoglobin: 13.2 g/dL (ref 13.0–17.0)
RBC: 4.34 MIL/uL (ref 4.22–5.81)
WBC: 11.2 10*3/uL — ABNORMAL HIGH (ref 4.0–10.5)

## 2012-01-03 NOTE — ED Notes (Signed)
Pt c/o abdominal pain starting Thurs, relieved by taking mag citrate. Pt ate popcorn and had recurrence of peri-umbilical abdominal pain and epigastric pain radiating to under bilateral ribs.

## 2012-01-03 NOTE — ED Notes (Signed)
Pt sts he has been having pain in his abdomen that started yesterday and has progressed all day. Sts he was here last month with the same. Was given pain medications that relieved the pain. Patient sts he recently ran out. Patient sts he drank 1 bottle of magnesium citrate last night and this morning. 2 diarrhea stools last night and emesis this morning.

## 2012-01-04 ENCOUNTER — Emergency Department (HOSPITAL_COMMUNITY): Payer: Self-pay

## 2012-01-04 LAB — COMPREHENSIVE METABOLIC PANEL
ALT: 12 U/L (ref 0–53)
Alkaline Phosphatase: 79 U/L (ref 39–117)
BUN: 10 mg/dL (ref 6–23)
CO2: 26 mEq/L (ref 19–32)
Chloride: 95 mEq/L — ABNORMAL LOW (ref 96–112)
GFR calc Af Amer: >90 mL/min (ref >90)
Glucose, Bld: 115 mg/dL — ABNORMAL HIGH (ref 70–99)
Potassium: 4.1 mEq/L (ref 3.5–5.1)
Sodium: 135 mEq/L (ref 135–145)
Total Bilirubin: 0.3 mg/dL (ref 0.3–1.2)

## 2012-01-04 LAB — LIPASE, BLOOD: Lipase: 1010 U/L — ABNORMAL HIGH (ref 11–59)

## 2012-01-04 MED ORDER — HYDROCODONE-ACETAMINOPHEN 5-325 MG PO TABS: 1.0000 | ORAL_TABLET | Freq: Four times a day (QID) | ORAL | Status: AC | PRN

## 2012-01-04 MED ORDER — HYDROCODONE-ACETAMINOPHEN 5-325 MG PO TABS
1.0000 | ORAL_TABLET | Freq: Once | ORAL | Status: AC
  Administered 2012-01-04: 1 via ORAL
  Filled 2012-01-04: qty 1

## 2012-01-04 NOTE — ED Notes (Signed)
Patient given discharge instructions, information, prescriptions, and diet order. Patient states that they adequately understand discharge information given and to return to ED if symptoms return or worsen.     

## 2012-01-04 NOTE — Discharge Instructions (Signed)
Acute Pancreatitis Acute pancreatitis is the sudden irritation (inflammation) of the pancreas. The pancreas produces digestive juices or enzymes that break down food. The pancreas also makes 2 hormones that control your blood sugar. If the pancreas is irritated, the enzymes attack internal structures and tissues become damaged. When this happens, the pancreas fails to make new enzymes that break down food. HOME CARE  Eat small meals often. Avoid fatty, greasy, and fried foods.   Follow diet instructions given to you by your doctor.   Avoid foods or drinks that may have started the problem (like alcohol).   Drink enough fluids to keep your pee (urine) clear or pale yellow.   Only take medicine as told by your doctor.   Rest often.   Check your blood sugar at home if you are told to.   Keep all your follow-up visits. This is very important.  GET HELP RIGHT AWAY IF:   You do not get better in the time your doctor said you would.   You have pain, weakness, or feel sick to your stomach (nauseous).   You recover but then have another episode of pain.   You are unable to eat or keep liquids down.   Your pain gets worse or changes.   You have a temperature by mouth above 102 F (38.9 C), not controlled by medicine.   Your skin or the white part of your eyes look yellow.   You start to throw up (vomit).   You feel dizzy or pass out (faint).   Your blood sugar is high (over 300).  MAKE SURE YOU:   Understand these instructions.   Will watch your condition.   Will get help right away if you are not doing well or get worse.  Document Released: 12/10/2007 Document Revised: 06/12/2011 Document Reviewed: 12/10/2007 [facility] Patient Information 2012 [place], [place].   Please follow the clear liquid diet for 24 hours than follow a bland diet for the next 1-2 days

## 2012-01-04 NOTE — ED Provider Notes (Signed)
History     CSN: [id number]  Arrival date & time 01/03/12  1901   First MD Initiated Contact with Patient 01/03/12 2306      Chief Complaint  Patient presents with  . Abdominal Pain    (Consider location/radiation/quality/duration/timing/severity/associated sxs/prior treatment) HPI Comments: Recent Hx of pancreatitis with hospitalization DC home with Vicodan which helped but now out of meds also became constipated for narcotic use too laxative with good results   The history is provided by the patient.    Past Medical History  Diagnosis Date  . Intestine disorder     intestinal blockage  . History of stomach ulcers     History reviewed. No pertinent past surgical history.  History reviewed. No pertinent family history.  History  Substance Use Topics  . Smoking status: Current Everyday Smoker -- 0.5 packs/day    Types: Cigarettes  . Smokeless tobacco: Never Used  . Alcohol Use: Yes     daily, denies use x 3-4 weeks.      Review of Systems  Constitutional: Negative for fever and chills.  Gastrointestinal: Positive for nausea, abdominal pain and constipation. Negative for vomiting.  Genitourinary: Negative for dysuria.  Neurological: Negative for dizziness.    Allergies  Review of patient's allergies indicates no known allergies.  Home Medications   Current Outpatient Rx  Name Route Sig Dispense Refill  . MAGNESIUM CITRATE 1.745 GM/30ML PO SOLN Oral Take 1 Bottle by mouth once.    Carma Leaven VITAL M PO TABS Oral Take 1 tablet by mouth daily.    Marland Kitchen HYDROCODONE-ACETAMINOPHEN 5-325 MG PO TABS Oral Take 1 tablet by mouth every 6 (six) hours as needed for pain. 30 tablet 0    BP 99/61  Pulse 82  Temp 98.7 F (37.1 C) (Oral)  Resp 18  SpO2 94%  Physical Exam  Constitutional: He appears well-developed and well-nourished.  HENT:  Head: Normocephalic.  Eyes: Pupils are equal, round, and reactive to light.  Neck: Normal range of motion.  Cardiovascular: Normal  rate.   Abdominal: He exhibits no distension. There is tenderness.    Musculoskeletal: Normal range of motion.  Neurological: He is alert.  Skin: Skin is warm.    ED Course  Procedures (including critical care time)  Labs Reviewed  CBC - Abnormal; Notable for the following:    WBC 11.2 (*)     Platelets 645 (*)     All other components within normal limits  COMPREHENSIVE METABOLIC PANEL - Abnormal; Notable for the following:    Chloride 95 (*)     Glucose, Bld 115 (*)     All other components within normal limits  LIPASE, BLOOD - Abnormal; Notable for the following:    Lipase 1010 (*)     All other components within normal limits   Dg Abd Acute W/chest  01/04/2012  *RADIOLOGY REPORT*  Clinical Data: Nausea, vomiting, mid abdominal pain, constipation, history smoking  ACUTE ABDOMEN SERIES (ABDOMEN 2 VIEW & CHEST 1 VIEW)  Comparison: 11/15/2005  Findings: Normal heart size, mediastinal contours, and pulmonary vascularity. Lungs clear. Question osseous demineralization. Scattered gas within large and small bowel loops throughout the abdomen. Several small bowel loops in the left mid abdomen show upper normal size question mild wall thickening. No evidence of obstruction or free air. No urinary tract calcification.  IMPRESSION: Few loops of minimally prominent small bowel left mid abdomen show questionable bowel wall thickening, which could represent an enteritis of any etiology. No definite evidence of  obstruction or perforation.  Original Report Authenticated By: Lollie Marrow, M.D.     1. Pancreatitis     No constipation seen on xray patient tolerating clear liquids    MDM   Patient has been constipated since discharge for the hospital several days ago Last night took a laxative with 1 formed BM and several liquid stools since         Arman Filter, NP 01/04/12 0231  Arman Filter, NP 01/04/12 0231

## 2012-01-05 NOTE — ED Provider Notes (Signed)
Medical screening examination/treatment/procedure(s) were performed by non-physician practitioner and as supervising physician I was immediately available for consultation/collaboration.  Sunnie Nielsen, MD 01/05/12 [phone]

## 2012-05-24 ENCOUNTER — Encounter (HOSPITAL_COMMUNITY): Payer: Self-pay | Admitting: Emergency Medicine

## 2012-05-24 ENCOUNTER — Observation Stay (HOSPITAL_COMMUNITY)
Admission: EM | Admit: 2012-05-24 | Discharge: 2012-05-25 | Disposition: A | Discharge status: Home / Self Care | Payer: Self-pay | Attending: Internal Medicine | Admitting: Internal Medicine

## 2012-05-24 ENCOUNTER — Emergency Department (HOSPITAL_COMMUNITY): Payer: Self-pay

## 2012-05-24 ENCOUNTER — Observation Stay (HOSPITAL_COMMUNITY): Payer: Self-pay

## 2012-05-24 DIAGNOSIS — E8809 Other disorders of plasma-protein metabolism, not elsewhere classified: Secondary | ICD-10-CM | POA: Insufficient documentation

## 2012-05-24 DIAGNOSIS — R109 Unspecified abdominal pain: Secondary | ICD-10-CM | POA: Insufficient documentation

## 2012-05-24 DIAGNOSIS — K7460 Unspecified cirrhosis of liver: Secondary | ICD-10-CM | POA: Insufficient documentation

## 2012-05-24 DIAGNOSIS — D649 Anemia, unspecified: Secondary | ICD-10-CM | POA: Insufficient documentation

## 2012-05-24 DIAGNOSIS — E875 Hyperkalemia: Secondary | ICD-10-CM | POA: Insufficient documentation

## 2012-05-24 DIAGNOSIS — F1011 Alcohol abuse, in remission: Secondary | ICD-10-CM | POA: Insufficient documentation

## 2012-05-24 DIAGNOSIS — R141 Gas pain: Secondary | ICD-10-CM | POA: Insufficient documentation

## 2012-05-24 DIAGNOSIS — R142 Eructation: Secondary | ICD-10-CM | POA: Insufficient documentation

## 2012-05-24 DIAGNOSIS — R609 Edema, unspecified: Secondary | ICD-10-CM | POA: Insufficient documentation

## 2012-05-24 DIAGNOSIS — K861 Other chronic pancreatitis: Secondary | ICD-10-CM | POA: Insufficient documentation

## 2012-05-24 DIAGNOSIS — R188 Other ascites: Principal | ICD-10-CM | POA: Insufficient documentation

## 2012-05-24 HISTORY — DX: Acute pancreatitis without necrosis or infection, unspecified: K85.90

## 2012-05-24 LAB — CBC WITH DIFFERENTIAL/PLATELET
Basophils Absolute: 0 10*3/uL (ref 0.0–0.1)
Basophils Relative: 1 % (ref 0–1)
Eosinophils Absolute: 0.1 10*3/uL (ref 0.0–0.7)
Hemoglobin: 11.7 g/dL — ABNORMAL LOW (ref 13.0–17.0)
MCH: 27.7 pg (ref 26.0–34.0)
MCHC: 31.8 g/dL (ref 30.0–36.0)
Monocytes Absolute: 0.3 10*3/uL (ref 0.1–1.0)
Monocytes Relative: 6 % (ref 3–12)
Neutro Abs: 2.6 10*3/uL (ref 1.7–7.7)
Neutrophils Relative %: 49 % (ref 43–77)
RDW: 15 % (ref 11.5–15.5)

## 2012-05-24 LAB — URINE MICROSCOPIC-ADD ON

## 2012-05-24 LAB — COMPREHENSIVE METABOLIC PANEL
AST: 19 U/L (ref 0–37)
Albumin: 2.5 g/dL — ABNORMAL LOW (ref 3.5–5.2)
BUN: 11 mg/dL (ref 6–23)
Chloride: 102 mEq/L (ref 96–112)
Creatinine, Ser: 0.9 mg/dL (ref 0.50–1.35)
Potassium: 5 mEq/L (ref 3.5–5.1)
Total Protein: 6.4 g/dL (ref 6.0–8.3)

## 2012-05-24 LAB — LIPASE, BLOOD: Lipase: 55 U/L (ref 11–59)

## 2012-05-24 LAB — URINALYSIS, ROUTINE W REFLEX MICROSCOPIC
Bilirubin Urine: NEGATIVE
Glucose, UA: NEGATIVE mg/dL
Specific Gravity, Urine: 1.025 (ref 1.005–1.030)
pH: 8.5 — ABNORMAL HIGH (ref 5.0–8.0)

## 2012-05-24 LAB — ALBUMIN: Albumin: 2.4 g/dL — ABNORMAL LOW (ref 3.5–5.2)

## 2012-05-24 LAB — PROTIME-INR: Prothrombin Time: 14 seconds (ref 11.6–15.2)

## 2012-05-24 LAB — OCCULT BLOOD, POC DEVICE: Fecal Occult Bld: NEGATIVE

## 2012-05-24 MED ORDER — SODIUM CHLORIDE 0.9 % IJ SOLN: 3.0000 mL | Freq: Two times a day (BID) | INTRAMUSCULAR | Status: DC

## 2012-05-24 MED ORDER — SODIUM CHLORIDE 0.9 % IJ SOLN
3.0000 mL | Freq: Two times a day (BID) | INTRAMUSCULAR | Status: DC
  Administered 2012-05-24 – 2012-05-25 (×2): 3 mL via INTRAVENOUS

## 2012-05-24 MED ORDER — ONDANSETRON HCL 4 MG PO TABS: 4.0000 mg | ORAL_TABLET | Freq: Four times a day (QID) | ORAL | Status: DC | PRN

## 2012-05-24 MED ORDER — ENOXAPARIN SODIUM 40 MG/0.4ML ~~LOC~~ SOLN
40.0000 mg | Freq: Once | SUBCUTANEOUS | Status: AC
  Administered 2012-05-24: 40 mg via SUBCUTANEOUS
  Filled 2012-05-24: qty 0.4

## 2012-05-24 MED ORDER — ACETAMINOPHEN 650 MG RE SUPP: 650.0000 mg | Freq: Four times a day (QID) | RECTAL | Status: DC | PRN

## 2012-05-24 MED ORDER — IOHEXOL 300 MG/ML  SOLN
100.0000 mL | Freq: Once | INTRAMUSCULAR | Status: AC | PRN
  Administered 2012-05-24: 100 mL via INTRAVENOUS

## 2012-05-24 MED ORDER — PANTOPRAZOLE SODIUM 40 MG IV SOLR
40.0000 mg | Freq: Two times a day (BID) | INTRAVENOUS | Status: DC
  Administered 2012-05-24: 40 mg via INTRAVENOUS
  Filled 2012-05-24 (×3): qty 40

## 2012-05-24 MED ORDER — LEVALBUTEROL HCL 0.63 MG/3ML IN NEBU
0.6300 mg | INHALATION_SOLUTION | Freq: Four times a day (QID) | RESPIRATORY_TRACT | Status: DC | PRN
  Filled 2012-05-24: qty 3

## 2012-05-24 MED ORDER — SODIUM CHLORIDE 0.9 % IJ SOLN: 3.0000 mL | INTRAMUSCULAR | Status: DC | PRN

## 2012-05-24 MED ORDER — SODIUM POLYSTYRENE SULFONATE 15 GM/60ML PO SUSP
15.0000 g | Freq: Once | ORAL | Status: AC
  Administered 2012-05-24: 15 g via ORAL
  Filled 2012-05-24: qty 60

## 2012-05-24 MED ORDER — HYDROCODONE-ACETAMINOPHEN 5-325 MG PO TABS: 1.0000 | ORAL_TABLET | ORAL | Status: DC | PRN

## 2012-05-24 MED ORDER — ALBUMIN HUMAN 25 % IV SOLN
12.5000 g | Freq: Once | INTRAVENOUS | Status: DC
  Filled 2012-05-24: qty 50

## 2012-05-24 MED ORDER — ONDANSETRON HCL 4 MG/2ML IJ SOLN: 4.0000 mg | Freq: Four times a day (QID) | INTRAMUSCULAR | Status: DC | PRN

## 2012-05-24 MED ORDER — ACETAMINOPHEN 325 MG PO TABS: 650.0000 mg | ORAL_TABLET | Freq: Four times a day (QID) | ORAL | Status: DC | PRN

## 2012-05-24 MED ORDER — SODIUM CHLORIDE 0.9 % IV SOLN: 250.0000 mL | INTRAVENOUS | Status: DC | PRN

## 2012-05-24 NOTE — Progress Notes (Signed)
Nursing Admission Note  Pt arrived to 6729 via stretcher from the ED. Significant other at bedside. No distress noted, VSS. Telemetry in place. Abdomen is notably distended and firm. Pt noted to have a round, firm, reddened bump on his posterior neck at the hairline - looks like a possibly abscess - pt states it showed up about the same time as his abdomen started swelling. Call bell within reach. Oriented to unit and surroundings. Will continue to monitor. C.Hillery Bhalla, RN.

## 2012-05-24 NOTE — ED Provider Notes (Signed)
Medical screening examination/treatment/procedure(s) were performed by non-physician practitioner and as supervising physician I was immediately available for consultation/collaboration.  John-Adam Tetsuo Coppola, M.D.     John-Adam Kenzel Ruesch, MD 05/24/12 2157 

## 2012-05-24 NOTE — ED Provider Notes (Signed)
History     CSN: [id number]  Arrival date & time 05/24/12  1004   First MD Initiated Contact with Patient 05/24/12 1224      Chief Complaint  Patient presents with  . Abdominal Pain    (Consider location/radiation/quality/duration/timing/severity/associated sxs/prior treatment) HPI Comments: Alexander Schmitt 61 y.o. male   The chief complaint is: Patient presents with:   Abdominal Pain    61 year old male with a past medical history of alcohol abuse, intestinal obstruction, and admission for acute pancreatitis presents today with chief complaint of abdominal pain and distention.  Patient states that since his admission i 6 months ago, that he has had intermittent chronic abdominal pain and constipation.  Patient states that he has to take large amounts of laxatives to have normal bowel movements.  He claims that he has not abused alcohol since his admission.  4 days ago his abdomen became markedly distended.  He has had intermittent severe crampy abdominal pain.  He has had only one small bowel movement since that time.   No vomiting, no diarrhea.  Patient has had intermittent black stools which he has attributed to coffee.  Denies hematochezia or hematemesis. He also has new onset peripheral edema.  Patient states he he has never had these symptoms before.  No cardiac history.  Positive history of smoking.  Denies fevers, chills, myalgias, arthralgias. Denies SOB, chest tightness or pressure, radiation to left arm, jaw or back, or diaphoresis. Denies dysuria, flank pain, suprapubic pain, frequency, urgency, or hematuria. Denies headaches, light headedness, weakness, visual disturbances.        The history is provided by the patient and medical records. No language interpreter was used.    Past Medical History  Diagnosis Date  . Intestine disorder     intestinal blockage  . History of stomach ulcers   . Pancreatitis     No past surgical history on file.  No family  history on file.  History  Substance Use Topics  . Smoking status: Current Every Day Smoker -- 0.5 packs/day    Types: Cigarettes  . Smokeless tobacco: Never Used  . Alcohol Use: Yes     Comment: daily, denies use x 3-4 weeks.      Review of Systems  Constitutional: Positive for activity change. Negative for fever.  HENT: Negative.   Eyes: Negative.   Respiratory: Negative.  Negative for chest tightness, shortness of breath and wheezing.   Cardiovascular: Positive for leg swelling.  Gastrointestinal: Positive for nausea, abdominal pain, constipation and abdominal distention. Negative for vomiting, diarrhea, blood in stool, anal bleeding and rectal pain.  Genitourinary: Negative.  Negative for dysuria.  Musculoskeletal: Negative.   All other systems reviewed and are negative.    Allergies  Review of patient's allergies indicates no known allergies.  Home Medications   Current Outpatient Rx  Name  Route  Sig  Dispense  Refill  . MAGNESIUM CITRATE PO SOLN   Oral   Take 1 Bottle by mouth once.         Carma Leaven VITAL M PO TABS   Oral   Take 1 tablet by mouth daily.           BP 130/86  Pulse 68  Temp 98 F (36.7 C) (Oral)  Resp 18  SpO2 97%  Physical Exam  Nursing note and vitals reviewed. Constitutional: No distress.       Very thin, edentulous ill-appearing man in NAD,  HENT:  Head: Normocephalic and atraumatic.  Eyes: Conjunctivae normal are normal. No scleral icterus.  Neck: Normal range of motion.  Cardiovascular: Normal rate, regular rhythm, normal heart sounds and intact distal pulses.   Pulmonary/Chest: Effort normal and breath sounds normal. No respiratory distress. He has no wheezes.  Abdominal: Bowel sounds are normal. He exhibits distension. There is no tenderness.       Large,tight, distended abdomen.  Tympanic to percussion. No caput medusa.  No Palpable organs or masses. BS distant.  Genitourinary: Rectal exam shows external hemorrhoid.  Rectal exam shows no fissure, no tenderness and anal tone normal. Guaiac negative stool.       Digital Rectal Exam reveals sphincter with good tone.Many, nonthrombosed external hemorrhoids. No masses or fissures. Stool color is brown with no overt blood.   Negative hemoccult  Lymphadenopathy:    He has no cervical adenopathy.  Skin: Skin is warm.       [place]allow.    ED Course  Procedures (including critical care time)  Labs Reviewed  CBC WITH DIFFERENTIAL - Abnormal; Notable for the following:    Hemoglobin 11.7 (*)     HCT 36.8 (*)     Platelets 415 (*)     All other components within normal limits  COMPREHENSIVE METABOLIC PANEL - Abnormal; Notable for the following:    Albumin 2.5 (*)     Total Bilirubin 0.2 (*)     All other components within normal limits  URINALYSIS, ROUTINE W REFLEX MICROSCOPIC - Abnormal; Notable for the following:    APPearance CLOUDY (*)     pH 8.5 (*)     Hgb urine dipstick SMALL (*)     All other components within normal limits  URINE MICROSCOPIC-ADD ON - Abnormal; Notable for the following:    Bacteria, UA FEW (*)     All other components within normal limits  LIPASE, BLOOD  OCCULT BLOOD, POC DEVICE   Ct Abdomen Pelvis W Contrast  05/24/2012  *RADIOLOGY REPORT*  Clinical Data: Abdominal distention, pain, history of chronic pancreatitis  CT ABDOMEN AND PELVIS WITH CONTRAST  Technique:  Multidetector CT imaging of the abdomen and pelvis was performed following the standard protocol during bolus administration of intravenous contrast.  Contrast: OMNIPAQUE IOHEXOL 300 MG/ML  SOLN  Comparison: 11/22/2011  Findings: Streaky bibasilar atelectasis.  No pericardial or pleural effusion.  Normal heart size.  Abdomen:  Interval development of a large amount of abdominal and pelvic ascites.  This would be amenable to large volume paracentesis.  Scattered small hypodense lesions throughout the liver appear grossly stable compatible with small cysts.  No biliary  dilatation. Hepatic and portal veins are patent.  Gallbladder is collapsed. Biliary system, adrenal glands, and kidneys are within normal limits for age and demonstrate no acute process.  Small renal cysts are noted largest in the right kidney lower pole measuring 2 cm. No hydronephrosis or obstruction.  Normal renal excretion.  The pancreas demonstrates diffuse parenchymal calcifications in the body and head region with slight ductal dilatation, similar to the prior study. Appearance compatible with chronic pancreatitis. Previous small pancreatic head heterogeneous fluid collection has resolved.  Negative for bowel obstruction, dilatation, ileus pattern, or free air.  Negative for focal abscess, hemorrhage, hematoma, or adenopathy.  Pelvis:  Large volume of ascites as described above.  Minor diverticulosis of the sigmoid.  Urinary bladder unremarkable.  No pelvic hemorrhage, adenopathy, inguinal abnormality, or hernia. Atherosclerotic changes of the aorta and iliac vessels.  Degenerative changes of the spine diffusely.  IMPRESSION: Interval  development of a large volume of abdominal and pelvic diffuse ascites.  Bibasilar atelectasis  Probable scattered small hepatic cysts  Chronic calcific pancreatitis changes.  Incidental renal cysts  Sigmoid diverticulosis   Original Report Authenticated By: Judie Petit. Shick, M.D.      1. Ascites   2. Anemia   3. Hypoalbuminemia       MDM  Patient with Confirmed large volume ascites.  Will need admission obs and further work up for new onset ascites and Paracentesis. I have spoken with Dr. Susie Cassette who has agreed to admit the patient.       Arthor Captain, PA-C 05/24/12 1820

## 2012-05-24 NOTE — ED Notes (Signed)
abd pain  X 4 days abd swollen and painful cannot have bm hx of pancreatitis

## 2012-05-24 NOTE — H&P (Addendum)
[facility] History and Physical  Alexander Schmitt ZOX:[id number] DOB: 07/13/1950 DOA: 05/24/2012  Referring physician:Abigail Manuela Neptune  PCP: No primary provider on file.   Chief Complaint: Abdominal pain  HPI:  61 year old male with a past medical history of alcohol abuse, intestinal obstruction, and admission for acute pancreatitis presents today with chief complaint of abdominal pain and distention. Patient states that since his admission i 6 months ago, that he has had intermittent chronic abdominal pain and constipation. Patient states that he has to take large amounts of laxatives to have normal bowel movements. He claims that he has not abused alcohol since his admission. 4 days ago his abdomen became markedly distended. He has had intermittent severe crampy abdominal pain. He has had only one small bowel movement since that time. No vomiting, no diarrhea. Patient has had intermittent black stools which he has attributed to coffee. Denies hematochezia or hematemesis.  He also has new onset peripheral edema. Patient states he he has never had these symptoms before. No cardiac history. Positive history of smoking. LOST 25 LBS in 6 months Denies fevers, chills, myalgias, arthralgias. Denies SOB, chest tightness or pressure, radiation to left arm, jaw or back, or diaphoresis. Denies dysuria, flank pain, suprapubic pain, frequency, urgency, or hematuria. Denies headaches, light headedness, weakness, visual disturbances He has a complex history with his last CAT scan showing 11/22/2011 shows 1. Acute inflammatory process around the head of the pancreas and the second portion of the duodenum. There is fluid around the second portion of the duodenum and pancreatic head. Diagnostic possibilities include acute on chronic pancreatitis with secondary inflammation of the duodenum, walled off perforated duodenal ulcer, and duodenal tumor. 2. Interval development of chronic calcific pancreatitis with a  complex 3 cm probable pancreatic head pseudocyst.   Currently he is not on any medications for his ascites     Review of Systems: negative for the following  Constitutional: Denies fever, chills, diaphoresis, appetite change and fatigue.  HEENT: Denies photophobia, eye pain, redness, hearing loss, ear pain, congestion, sore throat, rhinorrhea, sneezing, mouth sores, trouble swallowing, neck pain, neck stiffness and tinnitus.  Respiratory: Denies SOB, DOE, cough, chest tightness, and wheezing.  Cardiovascular: Denies chest pain, palpitations and leg swelling.  Gastrointestinal: Denies nausea, vomiting, abdominal pain, diarrhea, constipation, blood in stool and abdominal distention.  Genitourinary: Denies dysuria, urgency, frequency, hematuria, flank pain and difficulty urinating.  Musculoskeletal: Denies myalgias, back pain, joint swelling, arthralgias and gait problem.  Skin: Denies pallor, rash and wound.  Neurological: Denies dizziness, seizures, syncope, weakness, light-headedness, numbness and headaches.  Hematological: Denies adenopathy. Easy bruising, personal or family bleeding history  Psychiatric/Behavioral: Denies suicidal ideation, mood changes, confusion, nervousness, sleep disturbance and agitation       Past Medical History  Diagnosis Date  . Intestine disorder     intestinal blockage  . History of stomach ulcers   . Pancreatitis      No past surgical history on file.    Social History:  reports that he has been smoking Cigarettes.  He has been smoking about .5 packs per day. He has never used smokeless tobacco. He reports that he drinks alcohol. He reports that he does not use illicit drugs.    No Known Allergies  No family history on file.   Prior to Admission medications   Medication Sig Start Date End Date Taking? Authorizing Provider  magnesium citrate 1.745 GM/30ML SOLN Take 1 Bottle by mouth once.   Yes Historical Provider, MD  Multiple  Vitamins-Minerals (MULTIVITAMIN) tablet Take 1 tablet by mouth daily. 11/24/11 11/23/12 Yes Alison Murray, MD     Physical Exam: Filed Vitals:   05/24/12 1641 05/24/12 1730 05/24/12 1735 05/24/12 1800  BP:  125/85 125/85 138/93  Pulse: 68 67 70 69  Temp:      TempSrc:      Resp:   20   SpO2: 97% 100% 99% 99%     Constitutional: Vital signs reviewed. Patient is a well-developed and well-nourished in no acute distress and cooperative with exam. Alert and oriented x3.  Very thin, edentulous ill-appearing man in NAD,  HENT:  Head: Normocephalic and atraumatic.  Eyes: Conjunctivae normal are normal. No scleral icterus.  Neck: Normal range of motion.  Cardiovascular: Normal rate, regular rhythm, normal heart sounds and intact distal pulses.  Pulmonary/Chest: Effort normal and breath sounds normal. No respiratory distress. He has no wheezes.  Abdominal: Bowel sounds are normal. He exhibits distension. There is no tenderness.  Large,tight, distended abdomen. Tympanic to percussion. No caput medusa. No Palpable organs or masses. BS distant.  Genitourinary: Rectal exam shows external hemorrhoid. Rectal exam shows no fissure, no tenderness and anal tone normal. Guaiac negative stool.  Digital Rectal Exam reveals sphincter with good tone.Many, nonthrombosed external hemorrhoids. No masses or fissures. Stool color is brown with no overt blood. Ext: no edema and no cyanosis, pulses palpable bilaterally (DP and PT)  Hematology: no cervical, inginal, or axillary adenopathy.  Neurological: A&O x3, Strenght is normal and symmetric bilaterally, cranial nerve II-XII are grossly intact, no focal motor deficit, sensory intact to light touch bilaterally.  Skin: Warm, dry and intact. No rash, cyanosis, or clubbing.  Psychiatric: Normal mood and affect. speech and behavior is normal. Judgment and thought content normal. Cognition and memory are normal.       Labs on Admission:    Basic Metabolic  Panel:  Lab 05/24/12 1132  NA 136  K 5.0  CL 102  CO2 28  GLUCOSE 92  BUN 11  CREATININE 0.90  CALCIUM 9.3  MG --  PHOS --   Liver Function Tests:  Lab 05/24/12 1132  AST 19  ALT 18  ALKPHOS 75  BILITOT 0.2*  PROT 6.4  ALBUMIN 2.5*    Lab 05/24/12 1132  LIPASE 55  AMYLASE --   No results found for this basename: AMMONIA:5 in the last 168 hours CBC:  Lab 05/24/12 1132  WBC 5.2  NEUTROABS 2.6  HGB 11.7*  HCT 36.8*  MCV 87.2  PLT 415*   Cardiac Enzymes: No results found for this basename: CKTOTAL:5,CKMB:5,CKMBINDEX:5,TROPONINI:5 in the last 168 hours  BNP (last 3 results) No results found for this basename: PROBNP:3 in the last 8760 hours    CBG: No results found for this basename: GLUCAP:5 in the last 168 hours  Radiological Exams on Admission: Ct Abdomen Pelvis W Contrast  05/24/2012  *RADIOLOGY REPORT*  Clinical Data: Abdominal distention, pain, history of chronic pancreatitis  CT ABDOMEN AND PELVIS WITH CONTRAST  Technique:  Multidetector CT imaging of the abdomen and pelvis was performed following the standard protocol during bolus administration of intravenous contrast.  Contrast: OMNIPAQUE IOHEXOL 300 MG/ML  SOLN  Comparison: 11/22/2011  Findings: Streaky bibasilar atelectasis.  No pericardial or pleural effusion.  Normal heart size.  Abdomen:  Interval development of a large amount of abdominal and pelvic ascites.  This would be amenable to large volume paracentesis.  Scattered small hypodense lesions throughout the liver appear grossly stable compatible with  small cysts.  No biliary dilatation. Hepatic and portal veins are patent.  Gallbladder is collapsed. Biliary system, adrenal glands, and kidneys are within normal limits for age and demonstrate no acute process.  Small renal cysts are noted largest in the right kidney lower pole measuring 2 cm. No hydronephrosis or obstruction.  Normal renal excretion.  The pancreas demonstrates diffuse  parenchymal calcifications in the body and head region with slight ductal dilatation, similar to the prior study. Appearance compatible with chronic pancreatitis. Previous small pancreatic head heterogeneous fluid collection has resolved.  Negative for bowel obstruction, dilatation, ileus pattern, or free air.  Negative for focal abscess, hemorrhage, hematoma, or adenopathy.  Pelvis:  Large volume of ascites as described above.  Minor diverticulosis of the sigmoid.  Urinary bladder unremarkable.  No pelvic hemorrhage, adenopathy, inguinal abnormality, or hernia. Atherosclerotic changes of the aorta and iliac vessels.  Degenerative changes of the spine diffusely.  IMPRESSION: Interval development of a large volume of abdominal and pelvic diffuse ascites.  Bibasilar atelectasis  Probable scattered small hepatic cysts  Chronic calcific pancreatitis changes.  Incidental renal cysts  Sigmoid diverticulosis   Original Report Authenticated By: Judie Petit. Miles Costain, M.D.     EKG: Independently reviewed. Stable Assessment/Plan Active Problems:  Ascites  Cirrhosis  Chronic calcific pancreatitis   1. Abdominal pain likely secondary to ascites, also has a history of chronic calcific pancreatitis based on his last CAT scan, as well as the history of a complex 3 cm pancreatic pseudocyst. He was supposed to followup with gastroenterology Dr. Dulce Sellar, currently he is on no medications. We will need ultrasound-guided paracentesis in the morning. Will obtain albumin, total protein, cytology, Gram stain, cell count and differential, will also check alpha-fetoprotein. We'll have albumin available if large-volume paracentesis is required 2. 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 Hyperkalemia Will give 1 dose of kayexalate  Code Status:   full Family Communication: bedside Disposition Plan: admit   Time spent: 70 mins    [facility] Pager [phone]  If 7PM-7AM, please contact night-coverage [email or web address] Password [facility] 05/24/2012, 6:36 PM

## 2012-05-25 ENCOUNTER — Observation Stay (HOSPITAL_COMMUNITY): Payer: Self-pay

## 2012-05-25 DIAGNOSIS — K861 Other chronic pancreatitis: Secondary | ICD-10-CM

## 2012-05-25 DIAGNOSIS — E8809 Other disorders of plasma-protein metabolism, not elsewhere classified: Secondary | ICD-10-CM

## 2012-05-25 DIAGNOSIS — K7460 Unspecified cirrhosis of liver: Secondary | ICD-10-CM

## 2012-05-25 LAB — CBC
Platelets: 347 10*3/uL (ref 150–400)
RBC: 3.77 MIL/uL — ABNORMAL LOW (ref 4.22–5.81)
WBC: 5.4 10*3/uL (ref 4.0–10.5)

## 2012-05-25 LAB — TROPONIN I: Troponin I: <0.3 ng/mL (ref <0.30)

## 2012-05-25 LAB — COMPREHENSIVE METABOLIC PANEL
Albumin: 2.1 g/dL — ABNORMAL LOW (ref 3.5–5.2)
BUN: 12 mg/dL (ref 6–23)
CO2: 26 mEq/L (ref 19–32)
Chloride: 105 mEq/L (ref 96–112)
Creatinine, Ser: 0.89 mg/dL (ref 0.50–1.35)
GFR calc non Af Amer: >90 mL/min (ref >90)
Total Bilirubin: 0.1 mg/dL — ABNORMAL LOW (ref 0.3–1.2)

## 2012-05-25 LAB — TSH: TSH: 3.269 u[IU]/mL (ref 0.350–4.500)

## 2012-05-25 LAB — PROTEIN, BODY FLUID

## 2012-05-25 LAB — BODY FLUID CELL COUNT WITH DIFFERENTIAL
Lymphs, Fluid: 54 %
Monocyte-Macrophage-Serous Fluid: 43 % — ABNORMAL LOW (ref 50–90)

## 2012-05-25 LAB — ALBUMIN, FLUID (OTHER): Albumin, Fluid: 1.9 g/dL

## 2012-05-25 LAB — AFP TUMOR MARKER: AFP-Tumor Marker: 4.9 ng/mL (ref 0.0–8.0)

## 2012-05-25 MED ORDER — ADULT MULTIVITAMIN W/MINERALS CH
1.0000 | ORAL_TABLET | Freq: Every day | ORAL | Status: DC
  Administered 2012-05-25: 1 via ORAL
  Filled 2012-05-25: qty 1

## 2012-05-25 MED ORDER — SPIRONOLACTONE 25 MG PO TABS: 25.0000 mg | ORAL_TABLET | Freq: Every day | ORAL | Status: DC

## 2012-05-25 MED ORDER — ENSURE COMPLETE PO LIQD
237.0000 mL | Freq: Two times a day (BID) | ORAL | Status: DC
  Administered 2012-05-25: 237 mL via ORAL

## 2012-05-25 NOTE — Procedures (Signed)
Procedure : diagnostic and therapeutic paracentesis Specimen : 4.0  L cloudy serous fluid Complications : none immediate  Fluid sent to  Lab as requested. Pt tolerated well

## 2012-05-25 NOTE — Progress Notes (Signed)
Patient discharged to home. Patient AVS reviewed, Rx given to patient. Patient verbalized understanding of medications and follow-up appointments.  Patient remains stable; no signs or symptoms of distress.  Patient educated to return to the ER in cases of SOB, dizziness, fever, chest pain, or fainting.

## 2012-05-25 NOTE — Discharge Summary (Signed)
Physician Discharge Summary  Alexander Schmitt:[id number] DOB: Oct 19, 1950 DOA: 05/24/2012  PCP: No primary provider on file. Gastroenterologist: [facility]  Admit date: 05/24/2012 Discharge date: 05/25/2012  Time spent: 30 minutes  Recommendations for Outpatient Follow-up:  1. Patient will followup with gastroenterology as previously planned he will need to be followed closely for recurrent ascites and repeat paracenteses  Discharge Diagnoses:  Active Problems:  Ascites  Cirrhosis  Chronic calcific pancreatitis   Discharge Condition: Improved, being discharged home  Diet recommendation: 2 g low-sodium, patient is being given information on this  [facility] Weights   05/24/12 2021  Weight: 74.027 kg (163 lb 3.2 oz)    History of present illness:  61 year old African American male past medical history of alcohol abuse and pancreatitis who presents with intermittent chronic abdominal distention and crampy abdominal pain. This had been going on for the past week to the point where patient looks uncomfortable and so distended he could not take it anymore. He came in. Emergency room he was evaluated and found to have a chronic calcific pancreatitis with normal lipase levels. More concerning was an abdominal CT which noted a new large amount of ascites. This is felt to be secondary to his underlying cirrhosis brought on by previous alcohol abuse. Patient was admitted for further evaluation. His other labs were are normal and he was not felt to be in any spontaneous bacterial peritonitis. Hospital Course:  Ascites: Interventional radiology was consulted to the patient for paracenteses by ultrasound on 05/25/12. 4 L of cloudy serous fluid was drained and sent for studies. Minimum neutrophils and white blood cell count was noted. More consistent with chronic ascites. Patient will much better was able tolerate taking by mouth afterwards. An extensive discussion with patient about  being on a low-sodium diet and importance of how this would help minimize or at least decrease frequency of recurrence. Also needs to be closely followed by GI as an outpatient. Patient was medically stable to be discharged today 05/25/12. He has been started on Aldactone.  Chronic calcific pancreatitis: Noted but not in acute medical issue during this hospitalization  Cirrhosis: Chronic cigarette or alcohol abuse the patient has quit drinking.  Procedures:  Status post ultrasound-guided paracenteses using 4 L of fluid  Consultations:  Interventional radiology  Discharge Exam: Filed Vitals:   05/25/12 1132 05/25/12 1141 05/25/12 1148 05/25/12 1224  BP: 107/72 122/70 113/62 111/70  Pulse:    65  Temp:    97.9 F (36.6 C)  TempSrc:    Oral  Resp:    12  Height:      Weight:      SpO2:    97%    General: Alert and oriented x3, no acute distress Cardiovascular: Regular rate and rhythm, S1-S2 Respiratory: Clear to auscultation bilaterally Abdomen: Post paracenteses, it is minimal distention, positive bowel sounds Extremities: No clubbing or cyanosis, trace pitting edema  Discharge Instructions  Discharge Orders    Future Orders Please Complete By Expires   Diet - low sodium heart healthy      Increase activity slowly          Medication List     As of 05/25/2012  4:14 PM    TAKE these medications         magnesium citrate Soln   Take 1 Bottle by mouth once.      multivitamin tablet   Take 1 tablet by mouth daily.      spironolactone 25 MG tablet  Commonly known as: ALDACTONE   Take 1 tablet (25 mg total) by mouth daily.           Follow-up Information    Follow up with Freddy Jaksch, MD. Schedule an appointment as soon as possible for a visit in 1 month.   Contact information:   [address] [place] [address] [phone]           The results of significant diagnostics from this hospitalization (including imaging,  microbiology, ancillary and laboratory) are listed below for reference.    Significant Diagnostic Studies: X-ray Chest Pa And Lateral   05/24/2012  IMPRESSION: Bibasilar atelectasis.   Original Report Authenticated By: Ulyses Southward, M.D.    Ct Abdomen Pelvis W Contrast  05/24/2012    IMPRESSION: Interval development of a large volume of abdominal and pelvic diffuse ascites.  Bibasilar atelectasis  Probable scattered small hepatic cysts  Chronic calcific pancreatitis changes.  Incidental renal cysts  Sigmoid diverticulosis   Original Report Authenticated By: Judie Petit. Miles Costain, M.D.    US Paracentesis  05/25/2012  *RADIOLOGY REPORT*  Clinical Data: Increasing ascites of uncertain etiology.  Request has been made for therapeutic and diagnostic paracentesis.  ULTRASOUND GUIDED PARACENTESIS  Comparison:  None  An ultrasound guided paracentesis was thoroughly discussed with the patient and questions answered.  The benefits, risks, alternatives and complications were also discussed.  The patient understands and wishes to proceed with the procedure.  Written consent was obtained.  Ultrasound was performed to localize and mark an adequate pocket of fluid in the right lower quadrant of the abdomen.  The area was then prepped and draped in the normal sterile fashion.  1% Lidocaine was used for local anesthesia.  Under ultrasound guidance a 19 gauge Yueh catheter was introduced.  Paracentesis was performed.  The catheter was removed and a dressing applied.  Complications:  None immediate  Findings:  A total of approximately 4 liters of cloudy serous fluid was removed.  A fluid sample was sent for laboratory analysis.  IMPRESSION: Successful ultrasound guided paracentesis yielding 4 liters of ascites.  Read by: Anselm Pancoast, P.A.-C   Original Report Authenticated By: Tacey Ruiz, MD     Microbiology: No results found for this or any previous visit (from the past 240 hour(s)).   Labs: Basic Metabolic Panel:  Lab  05/25/12 0212 05/24/12 2031 05/24/12 1132  NA 138 -- 136  K 3.9 -- 5.0  CL 105 -- 102  CO2 26 -- 28  GLUCOSE 89 -- 92  BUN 12 -- 11  CREATININE 0.89 -- 0.90  CALCIUM 8.5 -- 9.3  MG -- 1.8 --  PHOS -- 3.7 --   Liver Function Tests:  Lab 05/25/12 0212 05/24/12 1945 05/24/12 1132  AST 22 -- 19  ALT 17 -- 18  ALKPHOS 65 -- 75  BILITOT 0.1* -- 0.2*  PROT 5.3* 6.1 6.4  ALBUMIN 2.1* 2.4* 2.5*    Lab 05/24/12 1132  LIPASE 55  AMYLASE --    Lab 05/24/12 1946  AMMONIA 24   CBC:  Lab 05/25/12 0212 05/24/12 1132  WBC 5.4 5.2  NEUTROABS -- 2.6  HGB 10.4* 11.7*  HCT 32.6* 36.8*  MCV 86.5 87.2  PLT 347 415*   Cardiac Enzymes:  Lab 05/25/12 0900 05/25/12 0206 05/24/12 2031  CKTOTAL -- -- --  CKMB -- -- --  CKMBINDEX -- -- --  TROPONINI <0.30 <0.30 <0.30   CBG:  Lab 05/25/12 1236  GLUCAP 64*  Signed:  Hollice Espy  [facility] 05/25/2012, 4:14 PM

## 2012-05-25 NOTE — Progress Notes (Signed)
INITIAL ADULT NUTRITION ASSESSMENT Date: 05/25/2012   Time: 11:19 AM  Reason for Assessment: Malnutrition Screening  INTERVENTION: 1. Discussed low sodium diet  2. Ensure Complete po BID, each supplement provides 350 kcal and 13 grams of protein. 3. MVI daily 4. RD to continue to follow nutrition care plan  DOCUMENTATION CODES Per approved criteria  -Non-severe (moderate) malnutrition in the context of chronic illness   ASSESSMENT: Male 61 y.o.  Dx: abdominal pain  Hx:  Past Medical History  Diagnosis Date  . Intestine disorder     intestinal blockage  . History of stomach ulcers   . Pancreatitis    History reviewed. No pertinent past surgical history.  Related Meds:     . albumin human  12.5 g Intravenous Once  . [COMPLETED] enoxaparin (LOVENOX) injection  40 mg Subcutaneous Once  . pantoprazole (PROTONIX) IV  40 mg Intravenous Q12H  . sodium chloride  3 mL Intravenous Q12H  . sodium chloride  3 mL Intravenous Q12H  . [COMPLETED] sodium polystyrene  15 g Oral Once   Ht: 6' (182.9 cm)  Wt: 163 lb 3.2 oz (74.027 kg)  Ideal Wt: 178 lb/80.9 kg % Ideal Wt: 91%  Wt Readings from Last 15 Encounters:  05/24/12 163 lb 3.2 oz (74.027 kg)  11/24/11 160 lb 11.5 oz (72.9 kg)  Usual Wt: 160 lb % Usual Wt: 100%  Body mass index is 22.13 kg/(m^2). Weight is WNL  Food/Nutrition Related Hx: pt report poor intake PTA  Labs:  CMP     Component Value Date/Time   NA 138 05/25/2012 0212   K 3.9 05/25/2012 0212   CL 105 05/25/2012 0212   CO2 26 05/25/2012 0212   GLUCOSE 89 05/25/2012 0212   BUN 12 05/25/2012 0212   CREATININE 0.89 05/25/2012 0212   CALCIUM 8.5 05/25/2012 0212   PROT 5.3* 05/25/2012 0212   ALBUMIN 2.1* 05/25/2012 0212   AST 22 05/25/2012 0212   ALT 17 05/25/2012 0212   ALKPHOS 65 05/25/2012 0212   BILITOT 0.1* 05/25/2012 0212   GFRNONAA >90 05/25/2012 0212   GFRAA >90 05/25/2012 0212   Magnesium  Date/Time Value Range Status  05/24/2012  8:31  PM 1.8  1.5 - 2.5 mg/dL Final   Phosphorus  Date/Time Value Range Status  05/24/2012  8:31 PM 3.7  2.3 - 4.6 mg/dL Final   No intake or output data in the 24 hours ending 05/25/12 1120  Diet Order: NPO  Supplements/Tube Feeding: none  IVF:    Estimated Nutritional Needs:   Kcal: 2000 - 2300 kcal Protein:  100 - 115 grams Fluid:  2 - 2.3 liters daily  Pt with hx of EtOH abuse, intestinal obstruction, and admission for acute pancreatitis presents to hospital 2/2 abdominal pain and distention.  Reports constipation and chronic abdominal pain x 6 months. Takes large amounts of laxatives to have normal BM. States he has not abused alcohol since admission 6 months ago. Reports 25 lb weight loss in 6 months. Pt reports that his usual weight is 165 lb, this is consistent with current weight. Pt appears thin. Temporal wasting is evident. He states that his intake has been poor x 6 months. States that he will go up to one day without eating during the week. Pt meets criteria for moderate malnutrition in the context of chronic illness as evidenced by moderate muscle mass wasting and intake of <75% of estimated energy intake x at least 1 month. Pt with weight loss but unable to  assess true percentage of weight as pt unable to report.  Pt states that he received a lunch tray today and he consumed 100%. Asking why he is on a low sodium diet. Discussed role of sodium intake and fluids. Pt understanding. Underwent paracentesis today, 4 liters removed.  NUTRITION DIAGNOSIS: Inadequate oral intake r/t poor appetite AEB pt report of weight loss and dietary recall.  MONITORING/EVALUATION(Goals): Goal: Diet advancement to meet >/= 90% of their estimated nutrition needs. Monitor: weight trends, lab trends, I/O's, diet advancement/PO intake  EDUCATION NEEDS: -No education needs identified at this time  Jarold Motto MS, RD, LDN Pager: [phone] After-hours pager: [phone]

## 2012-05-26 LAB — PATHOLOGIST SMEAR REVIEW

## 2012-05-26 NOTE — Progress Notes (Signed)
Late entry:    CARE MANAGEMENT NOTE 05/26/2012  Patient:  Alexander Schmitt, Alexander Schmitt   Account Number:  [id number]  Date Initiated:  05/26/2012  Documentation initiated by:  Japhet Morgenthaler  Subjective/Objective Assessment:   Consult requesting assistance with medications.     Action/Plan:   Noted on d/c that pt was prescribed only one medication other than over the counter meds. This medication is inexpensive per pharmacy.   Anticipated DC Date:  05/25/2012   Anticipated DC Plan:  HOME/SELF CARE         Choice offered to / List presented to:             Status of service:  Completed, signed off Medicare Important Message given?   (If response is "NO", the following Medicare IM given date fields will be blank) Date Medicare IM given:   Date Additional Medicare IM given:    Discharge Disposition:  HOME/SELF CARE  Per UR Regulation:    If discussed at Long Length of Stay Meetings, dates discussed:    Comments:  COMMENTS_COL

## 2012-05-28 LAB — BODY FLUID CULTURE: Culture: NO GROWTH

## 2012-07-25 ENCOUNTER — Emergency Department (HOSPITAL_COMMUNITY): Payer: Self-pay

## 2012-07-25 ENCOUNTER — Emergency Department (HOSPITAL_COMMUNITY)
Admission: EM | Admit: 2012-07-25 | Discharge: 2012-07-25 | Disposition: A | Discharge status: Home / Self Care | Payer: Self-pay | Attending: Emergency Medicine | Admitting: Emergency Medicine

## 2012-07-25 ENCOUNTER — Encounter (HOSPITAL_COMMUNITY): Payer: Self-pay | Admitting: Family Medicine

## 2012-07-25 DIAGNOSIS — K259 Gastric ulcer, unspecified as acute or chronic, without hemorrhage or perforation: Secondary | ICD-10-CM | POA: Insufficient documentation

## 2012-07-25 DIAGNOSIS — K639 Disease of intestine, unspecified: Secondary | ICD-10-CM | POA: Insufficient documentation

## 2012-07-25 DIAGNOSIS — Z79899 Other long term (current) drug therapy: Secondary | ICD-10-CM | POA: Insufficient documentation

## 2012-07-25 DIAGNOSIS — E875 Hyperkalemia: Secondary | ICD-10-CM | POA: Insufficient documentation

## 2012-07-25 DIAGNOSIS — F17200 Nicotine dependence, unspecified, uncomplicated: Secondary | ICD-10-CM | POA: Insufficient documentation

## 2012-07-25 DIAGNOSIS — K861 Other chronic pancreatitis: Secondary | ICD-10-CM

## 2012-07-25 DIAGNOSIS — R1110 Vomiting, unspecified: Secondary | ICD-10-CM | POA: Insufficient documentation

## 2012-07-25 DIAGNOSIS — K8689 Other specified diseases of pancreas: Secondary | ICD-10-CM | POA: Insufficient documentation

## 2012-07-25 LAB — CBC WITH DIFFERENTIAL/PLATELET
HCT: 39.8 % (ref 39.0–52.0)
Hemoglobin: 13.2 g/dL (ref 13.0–17.0)
Lymphocytes Relative: 34 % (ref 12–46)
Lymphs Abs: 3.3 10*3/uL (ref 0.7–4.0)
Monocytes Absolute: 0.7 10*3/uL (ref 0.1–1.0)
Monocytes Relative: 7 % (ref 3–12)
Neutro Abs: 5.8 10*3/uL (ref 1.7–7.7)
RBC: 4.52 MIL/uL (ref 4.22–5.81)
WBC: 9.9 10*3/uL (ref 4.0–10.5)

## 2012-07-25 LAB — COMPREHENSIVE METABOLIC PANEL
Alkaline Phosphatase: 66 U/L (ref 39–117)
BUN: 8 mg/dL (ref 6–23)
CO2: 26 mEq/L (ref 19–32)
Chloride: 100 mEq/L (ref 96–112)
Creatinine, Ser: 0.62 mg/dL (ref 0.50–1.35)
GFR calc non Af Amer: >90 mL/min (ref >90)
Potassium: 5.3 mEq/L — ABNORMAL HIGH (ref 3.5–5.1)
Total Bilirubin: 0.3 mg/dL (ref 0.3–1.2)

## 2012-07-25 LAB — LIPASE, BLOOD: Lipase: 20 U/L (ref 11–59)

## 2012-07-25 LAB — URINALYSIS, ROUTINE W REFLEX MICROSCOPIC
Glucose, UA: NEGATIVE mg/dL
Hgb urine dipstick: NEGATIVE
Ketones, ur: NEGATIVE mg/dL
Leukocytes, UA: NEGATIVE
Protein, ur: NEGATIVE mg/dL
pH: 6 (ref 5.0–8.0)

## 2012-07-25 LAB — PROTIME-INR: Prothrombin Time: 13.5 seconds (ref 11.6–15.2)

## 2012-07-25 MED ORDER — SODIUM CHLORIDE 0.9 % IV SOLN
INTRAVENOUS | Status: DC
  Administered 2012-07-25: 1000 mL via INTRAVENOUS
  Administered 2012-07-25: 18:00:00 via INTRAVENOUS

## 2012-07-25 MED ORDER — HYDROMORPHONE HCL PF 1 MG/ML IJ SOLN
1.0000 mg | Freq: Once | INTRAMUSCULAR | Status: AC
  Administered 2012-07-25: 1 mg via INTRAVENOUS
  Filled 2012-07-25: qty 1

## 2012-07-25 MED ORDER — IOHEXOL 300 MG/ML  SOLN
100.0000 mL | Freq: Once | INTRAMUSCULAR | Status: AC | PRN
  Administered 2012-07-25: 100 mL via INTRAVENOUS

## 2012-07-25 MED ORDER — IOHEXOL 300 MG/ML  SOLN
50.0000 mL | Freq: Once | INTRAMUSCULAR | Status: AC | PRN
  Administered 2012-07-25: 50 mL via ORAL

## 2012-07-25 MED ORDER — ONDANSETRON HCL 4 MG/2ML IJ SOLN
4.0000 mg | Freq: Once | INTRAMUSCULAR | Status: AC
  Administered 2012-07-25: 4 mg via INTRAVENOUS

## 2012-07-25 MED ORDER — ONDANSETRON HCL 4 MG/2ML IJ SOLN
4.0000 mg | Freq: Once | INTRAMUSCULAR | Status: DC
  Filled 2012-07-25: qty 2

## 2012-07-25 NOTE — ED Notes (Signed)
Dr Allen at bedside  

## 2012-07-25 NOTE — ED Notes (Signed)
Pt denies dizziness and lightheadedness. Pt denies pain. Pt denies difficulty breathing. Pt mentating appropriately. Pt does not appear to be in acute distress. Pt instructed not to drive. Pt states that he is not driving. Pt educated on follow up care importance. Pt given d/c teaching and instructions. Pt verbalized understanding. Pt has no further questions.

## 2012-07-25 NOTE — ED Provider Notes (Addendum)
History     CSN: [id number]  Arrival date & time 07/25/12  1342   First MD Initiated Contact with Patient 07/25/12 1358      Chief Complaint  Patient presents with  . Abdominal Pain    (Consider location/radiation/quality/duration/timing/severity/associated sxs/prior treatment) Patient is a 62 y.o. male presenting with abdominal pain. The history is provided by the patient.  Abdominal Pain The primary symptoms of the illness include abdominal pain.   patient here complaining of epigastric abdominal pain characterized as sharp x3 days. He has had emesis x1. No fever. No black or bloody stools or diarrhea noted. History of pancreatitis in the past and does admit to alcohol use prior to when the symptoms started. No medications taken for this prior to arrival. 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 Intestine disorder     intestinal blockage  . History of stomach ulcers   . Pancreatitis     History reviewed. No pertinent past surgical history.  History reviewed. No pertinent family history.  History  Substance Use Topics  . Smoking status: Current Every Day Smoker -- 0.2 packs/day for 18 years    Types: Cigarettes  . Smokeless tobacco: Never Used  . Alcohol Use: No      Review of Systems  Gastrointestinal: Positive for abdominal pain.  All other systems reviewed and are negative.    Allergies  Review of patient's allergies indicates no known allergies.  Home Medications   Current Outpatient Rx  Name  Route  Sig  Dispense  Refill  . MAGNESIUM CITRATE PO SOLN   Oral   Take 1 Bottle by mouth once.         Carma Leaven VITAL M PO TABS   Oral   Take 1 tablet by mouth daily.         Marland Kitchen SPIRONOLACTONE 25 MG PO TABS   Oral   Take 1 tablet (25 mg total) by mouth daily.   30 tablet   0     BP 133/94  Pulse 93  Temp 98 F (36.7 C)  Resp 18  SpO2 99%  Physical  Exam  Nursing note and vitals reviewed. Constitutional: He is oriented to person, place, and time. He appears well-developed and well-nourished.  Non-toxic appearance. No distress.  HENT:  Head: Normocephalic and atraumatic.  Eyes: Conjunctivae normal, EOM and lids are normal. Pupils are equal, round, and reactive to light.  Neck: Normal range of motion. Neck supple. No tracheal deviation present. No mass present.  Cardiovascular: Normal rate, regular rhythm and normal heart sounds.  Exam reveals no gallop.   No murmur heard. Pulmonary/Chest: Effort normal and breath sounds normal. No stridor. No respiratory distress. He has no decreased breath sounds. He has no wheezes. He has no rhonchi. He has no rales.  Abdominal: Soft. Normal appearance and bowel sounds are normal. He exhibits no distension. There is tenderness in the epigastric area. There is guarding. There is no rigidity, no rebound and no CVA tenderness.  Musculoskeletal: Normal range of motion. He exhibits no edema and no tenderness.  Neurological: He is alert and oriented to person, place, and time. He has normal strength. No cranial nerve deficit or sensory deficit. GCS eye subscore is 4. GCS verbal subscore is 5. GCS motor subscore is 6.  Skin: Skin is warm and dry. No abrasion and no rash noted.  Psychiatric: He has a  normal mood and affect. His speech is normal and behavior is normal.    ED Course  Procedures (including critical care time)   Labs Reviewed  CBC WITH DIFFERENTIAL  COMPREHENSIVE METABOLIC PANEL  LIPASE, BLOOD  PROTIME-INR  APTT  URINALYSIS, ROUTINE W REFLEX MICROSCOPIC  URINE CULTURE   No results found.   No diagnosis found.    MDM  Patient given meds for pain and vomiting. Acute abdominal series with questionable early small bowel obstruction. Patient to have abdominal CT  7:33 PM Abdominal CT negative for obstruction but does show inflammation around the pancreas as well as the duodenum. Patient  was instructed about these findings and will be given referral to GI on call. Mild hyperkalemia noted at 5.4. His renal function is normal. Suspect that his likely from hemolysis. Will tell patient to have his potassium repeated.   Suspect that pts sx are from his alcohol use recently     Toy Baker, MD 07/25/12 1934   Toy Baker, MD 07/25/12 [id number]

## 2012-07-25 NOTE — ED Notes (Signed)
Per pt sts right side pain and generalized abdominal pain for a few days. sts vomited this am.

## 2012-07-25 NOTE — ED Notes (Signed)
Pt denies pain currently. Pt denies N/V. Pt denies numbness and tingling. Pt denies lightheadedness and dizziness. Pt mentating appropriately.

## 2012-07-25 NOTE — ED Notes (Signed)
[place] in CT notified that pt completed contrast

## 2012-07-25 NOTE — ED Notes (Signed)
Pt ambulatory leaving ED. Pt leaving with wife. Pt given d/c teaching. Pt does not appear to be in acute distress.

## 2012-07-25 NOTE — ED Notes (Addendum)
Pt states "I think I might have drank that stuff too fast, my stomach trying to hurt now." Pt denies N/V. Pt mentating appropriately.

## 2012-07-25 NOTE — ED Notes (Signed)
CT called and verified that pt ready for contrast. CT states will bring contrast over

## 2012-07-26 LAB — URINE CULTURE
Colony Count: NO GROWTH
Culture: NO GROWTH

## 2012-09-02 ENCOUNTER — Ambulatory Visit (HOSPITAL_COMMUNITY)
Admission: RE | Admit: 2012-09-02 | Discharge: 2012-09-02 | Disposition: A | Discharge status: Home / Self Care | Payer: No Typology Code available for payment source | Source: Ambulatory Visit | Attending: Family Medicine | Admitting: Family Medicine

## 2012-09-02 ENCOUNTER — Other Ambulatory Visit (HOSPITAL_COMMUNITY): Payer: Self-pay | Admitting: Family Medicine

## 2012-09-02 DIAGNOSIS — R52 Pain, unspecified: Secondary | ICD-10-CM

## 2012-09-02 DIAGNOSIS — M25519 Pain in unspecified shoulder: Secondary | ICD-10-CM | POA: Insufficient documentation

## 2012-09-15 ENCOUNTER — Encounter (INDEPENDENT_AMBULATORY_CARE_PROVIDER_SITE_OTHER): Payer: Self-pay | Admitting: Surgery

## 2012-09-20 ENCOUNTER — Encounter (INDEPENDENT_AMBULATORY_CARE_PROVIDER_SITE_OTHER): Payer: Self-pay | Admitting: Surgery

## 2012-09-20 ENCOUNTER — Encounter (HOSPITAL_COMMUNITY): Payer: Self-pay | Admitting: Pharmacy Technician

## 2012-09-20 ENCOUNTER — Ambulatory Visit (INDEPENDENT_AMBULATORY_CARE_PROVIDER_SITE_OTHER): Payer: PRIVATE HEALTH INSURANCE | Admitting: Surgery

## 2012-09-20 VITALS — BP 130/78 | HR 72 | Temp 98.1°F | Resp 16 | Ht 72.0 in | Wt 161.0 lb

## 2012-09-20 DIAGNOSIS — K4090 Unilateral inguinal hernia, without obstruction or gangrene, not specified as recurrent: Secondary | ICD-10-CM

## 2012-09-20 DIAGNOSIS — K429 Umbilical hernia without obstruction or gangrene: Secondary | ICD-10-CM | POA: Insufficient documentation

## 2012-09-20 NOTE — Progress Notes (Signed)
Patient ID: Alexander Schmitt, male   DOB: 09/27/1950, 61 y.o.   MRN: [id number]  Chief Complaint  Patient presents with  . New Evaluation    eval RIH    HPI Alexander Schmitt is a 61 y.o. male.  Referred by Dr. Chara Freeman for evaluation of right inguinal hernia HPI This is a 61-year-old male who presents with a one-month history of an enlarging uncomfortable bulge in his right groin. This remains reducible. He denies any symptoms in his left groin. It occasionally does have some constipation but this was present prior to noticing the bulge. The patient was diagnosed with pancreatitis and cirrhosis last year. His latest liver function tests were all normal and he has not developed any ascites. He has stopped drinking alcohol completely. He is on maintenance spironolactone. He comes in to discuss right inguinal hernia repair.   Past Medical History  Diagnosis Date  . Intestine disorder     intestinal blockage  . History of stomach ulcers   . Pancreatitis   Cirrhosis  History reviewed. No pertinent past surgical history.  History reviewed. No pertinent family history.  Social History History  Substance Use Topics  . Smoking status: Light Tobacco Smoker -- 0.25 packs/day for 18 years    Types: Cigarettes  . Smokeless tobacco: Never Used     Comment: smokes 1 cig a week  . Alcohol Use: No    No Known Allergies  Current Outpatient Prescriptions  Medication Sig Dispense Refill  . magnesium citrate SOLN Take 0.5 Bottles by mouth as needed. For constipation      . meloxicam (MOBIC) 7.5 MG tablet Take 7.5 mg by mouth daily.      . Multiple Vitamins-Minerals (MULTIVITAMIN) tablet Take 1 tablet by mouth daily.      . spironolactone (ALDACTONE) 25 MG tablet Take 25 mg by mouth daily.       No current facility-administered medications for this visit.    Review of Systems Review of Systems  Constitutional: Negative for fever, chills and unexpected weight change.  HENT: Negative for  hearing loss, congestion, sore throat, trouble swallowing and voice change.   Eyes: Negative for visual disturbance.  Respiratory: Negative for cough and wheezing.   Cardiovascular: Negative for chest pain, palpitations and leg swelling.  Gastrointestinal: Negative for nausea, vomiting, abdominal pain, diarrhea, constipation, blood in stool, abdominal distention, anal bleeding and rectal pain.  Genitourinary: Positive for scrotal swelling. Negative for hematuria and difficulty urinating.  Musculoskeletal: Negative for arthralgias.  Skin: Negative for rash and wound.  Neurological: Negative for seizures, syncope, weakness and headaches.  Hematological: Negative for adenopathy. Does not bruise/bleed easily.  Psychiatric/Behavioral: Negative for confusion.    Blood pressure 130/78, pulse 72, temperature 98.1 F (36.7 C), temperature source Temporal, resp. rate 16, height 6' (1.829 m), weight 161 lb (73.029 kg), SpO2 97.00%.  Physical Exam Physical Exam WDWN in NAD HEENT:  EOMI, sclera anicteric Neck:  No masses, no thyromegaly Lungs:  CTA bilaterally; normal respiratory effort CV:  Regular rate and rhythm; no murmurs Abd:  +bowel sounds, soft, non-tender, small palpable umbilical hernia GU:  Bilateral descended testes; no testicular masses; large visible reducible right inguinal hernia; no sign of left inguinal hernia Ext:  Well-perfused; no edema Skin:  Warm, dry; no sign of jaundice  Data Reviewed none  Assessment    Umbilical hernia Right inguinal hernia - reducible     Plan    Right inguinal hernia repair with mesh.  Umbilical hernia repair -   The surgical procedure has been discussed with the patient.  Potential risks, benefits, alternative treatments, and expected outcomes have been explained.  All of the patient's questions at this time have been answered.  The likelihood of reaching the patient's treatment goal is good.  The patient understand the proposed surgical  procedure and wishes to proceed.         Juanice Warburton K. 09/20/2012, 11:52 AM    

## 2012-09-22 NOTE — Patient Instructions (Signed)
Alexander Schmitt  09/22/2012   Your procedure is scheduled on: 09/29/12    Report to [facility] at   0630  AM.  Call this number if you have problems the morning of surgery: [phone]   Remember:   Do not eat food or drink liquids after midnight.   Take these medicines the morning of surgery with A SIP OF WATER:    Do not wear jewelry,   Do not wear lotions, powders, or perfumes. .   Men may shave face and neck.  Do not bring valuables to the hospital.  Contacts, dentures or bridgework may not be worn into surgery.     Patients discharged the day of surgery will not be allowed to drive  home.  Name and phone number of your driver:    SEE CHG INSTRUCTION SHEET    Please read over the following fact sheets that you were given: MRSA Information, coughing and deep breathing exercises, leg exercises               Failure to comply with these instructions may result in cancellation of your surgery.                Patient Signature ____________________________              Nurse Signature _____________________________

## 2012-09-23 ENCOUNTER — Encounter (HOSPITAL_COMMUNITY): Payer: Self-pay

## 2012-09-23 ENCOUNTER — Encounter (HOSPITAL_COMMUNITY)
Admission: RE | Admit: 2012-09-23 | Discharge: 2012-09-23 | Disposition: A | Discharge status: Home / Self Care | Payer: No Typology Code available for payment source | Source: Ambulatory Visit | Attending: Surgery | Admitting: Surgery

## 2012-09-23 HISTORY — DX: Unspecified osteoarthritis, unspecified site: M19.90

## 2012-09-23 HISTORY — DX: Unspecified cirrhosis of liver: K74.60

## 2012-09-23 HISTORY — DX: Gastro-esophageal reflux disease without esophagitis: K21.9

## 2012-09-23 LAB — CBC
HCT: 40.6 % (ref 39.0–52.0)
Hemoglobin: 13.4 g/dL (ref 13.0–17.0)
MCV: 90.8 fL (ref 78.0–100.0)
RBC: 4.47 MIL/uL (ref 4.22–5.81)
WBC: 6.9 10*3/uL (ref 4.0–10.5)

## 2012-09-23 LAB — COMPREHENSIVE METABOLIC PANEL
Albumin: 3.5 g/dL (ref 3.5–5.2)
Alkaline Phosphatase: 73 U/L (ref 39–117)
BUN: 14 mg/dL (ref 6–23)
CO2: 27 mEq/L (ref 19–32)
Chloride: 106 mEq/L (ref 96–112)
Creatinine, Ser: 0.82 mg/dL (ref 0.50–1.35)
GFR calc non Af Amer: >90 mL/min (ref >90)
Glucose, Bld: 85 mg/dL (ref 70–99)
Potassium: 4.6 mEq/L (ref 3.5–5.1)
Total Bilirubin: 0.3 mg/dL (ref 0.3–1.2)

## 2012-09-23 LAB — SURGICAL PCR SCREEN: Staphylococcus aureus: NEGATIVE

## 2012-09-28 ENCOUNTER — Encounter (INDEPENDENT_AMBULATORY_CARE_PROVIDER_SITE_OTHER): Payer: Self-pay

## 2012-09-28 NOTE — Progress Notes (Addendum)
Attempted to call [facility] on on 09/24/12 and 09/28/12 to obtain old EKG.  No answer at  office.

## 2012-09-28 NOTE — Anesthesia Preprocedure Evaluation (Addendum)
Anesthesia Evaluation  Patient identified by MRN, date of birth, ID band Patient awake    Reviewed: Allergy & Precautions, H&P , NPO status , Patient's Chart, lab work & pertinent test results, reviewed documented beta blocker date and time   Airway Mallampati: II TM Distance: >3 FB Neck ROM: full    Dental no notable dental hx.    Pulmonary Current Smoker,  breath sounds clear to auscultation  Pulmonary exam normal       Cardiovascular Exercise Tolerance: Good negative cardio ROS  Rhythm:regular Rate:Normal     Neuro/Psych negative neurological ROS  negative psych ROS   GI/Hepatic negative GI ROS, GERD-  ,(+) Cirrhosis -  ascites     ,   Endo/Other  negative endocrine ROS  Renal/GU negative Renal ROS  negative genitourinary   Musculoskeletal   Abdominal   Peds  Hematology negative hematology ROS (+)   Anesthesia Other Findings   Reproductive/Obstetrics negative OB ROS                          Anesthesia Physical Anesthesia Plan  ASA: III  Anesthesia Plan: General   Post-op Pain Management:    Induction:   Airway Management Planned: Oral ETT  Additional Equipment:   Intra-op Plan:   Post-operative Plan:   Informed Consent: I have reviewed the patients History and Physical, chart, labs and discussed the procedure including the risks, benefits and alternatives for the proposed anesthesia with the patient or authorized representative who has indicated his/her understanding and acceptance.   Dental Advisory Given  Plan Discussed with: CRNA  Anesthesia Plan Comments:         Anesthesia Quick Evaluation

## 2012-09-29 ENCOUNTER — Ambulatory Visit (HOSPITAL_COMMUNITY)
Admission: RE | Admit: 2012-09-29 | Discharge: 2012-09-29 | Disposition: A | Discharge status: Home / Self Care | Payer: No Typology Code available for payment source | Source: Ambulatory Visit | Attending: Surgery | Admitting: Surgery

## 2012-09-29 ENCOUNTER — Encounter (HOSPITAL_COMMUNITY): Payer: Self-pay | Admitting: Anesthesiology

## 2012-09-29 ENCOUNTER — Encounter (HOSPITAL_COMMUNITY): Payer: Self-pay | Admitting: *Deleted

## 2012-09-29 ENCOUNTER — Encounter (HOSPITAL_COMMUNITY)
Admission: RE | Disposition: A | Discharge status: Home / Self Care | Payer: Self-pay | Source: Ambulatory Visit | Attending: Surgery

## 2012-09-29 ENCOUNTER — Ambulatory Visit (HOSPITAL_COMMUNITY): Payer: No Typology Code available for payment source | Admitting: Anesthesiology

## 2012-09-29 DIAGNOSIS — F17200 Nicotine dependence, unspecified, uncomplicated: Secondary | ICD-10-CM | POA: Insufficient documentation

## 2012-09-29 DIAGNOSIS — K4090 Unilateral inguinal hernia, without obstruction or gangrene, not specified as recurrent: Secondary | ICD-10-CM

## 2012-09-29 DIAGNOSIS — K7460 Unspecified cirrhosis of liver: Secondary | ICD-10-CM | POA: Insufficient documentation

## 2012-09-29 DIAGNOSIS — Z79899 Other long term (current) drug therapy: Secondary | ICD-10-CM | POA: Insufficient documentation

## 2012-09-29 DIAGNOSIS — K429 Umbilical hernia without obstruction or gangrene: Secondary | ICD-10-CM

## 2012-09-29 DIAGNOSIS — Z791 Long term (current) use of non-steroidal anti-inflammatories (NSAID): Secondary | ICD-10-CM | POA: Insufficient documentation

## 2012-09-29 DIAGNOSIS — R188 Other ascites: Secondary | ICD-10-CM | POA: Insufficient documentation

## 2012-09-29 HISTORY — PX: UMBILICAL HERNIA REPAIR: SHX196

## 2012-09-29 HISTORY — PX: INGUINAL HERNIA REPAIR: SHX194

## 2012-09-29 HISTORY — PX: INSERTION OF MESH: SHX5868

## 2012-09-29 SURGERY — REPAIR, HERNIA, INGUINAL, ADULT
Anesthesia: General | Site: Groin | Laterality: Right | Wound class: Clean

## 2012-09-29 MED ORDER — MIDAZOLAM HCL 5 MG/5ML IJ SOLN
INTRAMUSCULAR | Status: DC | PRN
  Administered 2012-09-29: 2 mg via INTRAVENOUS

## 2012-09-29 MED ORDER — BUPIVACAINE HCL (PF) 0.25 % IJ SOLN
INTRAMUSCULAR | Status: AC
  Filled 2012-09-29: qty 30

## 2012-09-29 MED ORDER — OXYCODONE HCL 5 MG PO TABS
10.0000 mg | ORAL_TABLET | Freq: Four times a day (QID) | ORAL | Status: DC | PRN
  Administered 2012-09-29: 5 mg via ORAL
  Filled 2012-09-29: qty 1

## 2012-09-29 MED ORDER — CHLORHEXIDINE GLUCONATE 4 % EX LIQD: 1.0000 "application " | Freq: Once | CUTANEOUS | Status: DC

## 2012-09-29 MED ORDER — FENTANYL CITRATE 0.05 MG/ML IJ SOLN
25.0000 ug | INTRAMUSCULAR | Status: DC | PRN
  Administered 2012-09-29: 50 ug via INTRAVENOUS

## 2012-09-29 MED ORDER — LACTATED RINGERS IV SOLN
INTRAVENOUS | Status: DC | PRN
  Administered 2012-09-29 (×2): via INTRAVENOUS

## 2012-09-29 MED ORDER — DEXAMETHASONE SODIUM PHOSPHATE 10 MG/ML IJ SOLN
INTRAMUSCULAR | Status: DC | PRN
  Administered 2012-09-29: 10 mg via INTRAVENOUS

## 2012-09-29 MED ORDER — FENTANYL CITRATE 0.05 MG/ML IJ SOLN
INTRAMUSCULAR | Status: DC | PRN
  Administered 2012-09-29: 100 ug via INTRAVENOUS
  Administered 2012-09-29 (×3): 50 ug via INTRAVENOUS

## 2012-09-29 MED ORDER — ONDANSETRON HCL 4 MG/2ML IJ SOLN: 4.0000 mg | INTRAMUSCULAR | Status: DC | PRN

## 2012-09-29 MED ORDER — LIDOCAINE HCL (CARDIAC) 20 MG/ML IV SOLN
INTRAVENOUS | Status: DC | PRN
  Administered 2012-09-29: 70 mg via INTRAVENOUS

## 2012-09-29 MED ORDER — ROCURONIUM BROMIDE 100 MG/10ML IV SOLN
INTRAVENOUS | Status: DC | PRN
  Administered 2012-09-29: 40 mg via INTRAVENOUS
  Administered 2012-09-29: 10 mg via INTRAVENOUS

## 2012-09-29 MED ORDER — PROPOFOL 10 MG/ML IV BOLUS
INTRAVENOUS | Status: DC | PRN
  Administered 2012-09-29: 160 mg via INTRAVENOUS

## 2012-09-29 MED ORDER — BUPIVACAINE-EPINEPHRINE PF 0.25-1:200000 % IJ SOLN
INTRAMUSCULAR | Status: AC
  Filled 2012-09-29: qty 30

## 2012-09-29 MED ORDER — CEFAZOLIN SODIUM-DEXTROSE 2-3 GM-% IV SOLR
2.0000 g | INTRAVENOUS | Status: AC
  Administered 2012-09-29: 2 g via INTRAVENOUS

## 2012-09-29 MED ORDER — BUPIVACAINE-EPINEPHRINE 0.25% -1:200000 IJ SOLN
INTRAMUSCULAR | Status: DC | PRN
  Administered 2012-09-29: 7 mL
  Administered 2012-09-29: 6 mL
  Administered 2012-09-29: 10 mL

## 2012-09-29 MED ORDER — 0.9 % SODIUM CHLORIDE (POUR BTL) OPTIME
TOPICAL | Status: DC | PRN
  Administered 2012-09-29: 1000 mL

## 2012-09-29 MED ORDER — BUPIVACAINE LIPOSOME 1.3 % IJ SUSP
20.0000 mL | Freq: Once | INTRAMUSCULAR | Status: DC
  Filled 2012-09-29: qty 20

## 2012-09-29 MED ORDER — FENTANYL CITRATE 0.05 MG/ML IJ SOLN
INTRAMUSCULAR | Status: AC
  Filled 2012-09-29: qty 2

## 2012-09-29 MED ORDER — OXYCODONE HCL 5 MG PO TABS: 5.0000 mg | ORAL_TABLET | ORAL | Status: DC | PRN

## 2012-09-29 MED ORDER — CEFAZOLIN SODIUM-DEXTROSE 2-3 GM-% IV SOLR
INTRAVENOUS | Status: AC
  Filled 2012-09-29: qty 50

## 2012-09-29 MED ORDER — ONDANSETRON HCL 4 MG/2ML IJ SOLN
INTRAMUSCULAR | Status: DC | PRN
  Administered 2012-09-29: 4 mg via INTRAVENOUS

## 2012-09-29 MED ORDER — MORPHINE SULFATE 10 MG/ML IJ SOLN: 2.0000 mg | INTRAMUSCULAR | Status: DC | PRN

## 2012-09-29 MED ORDER — PROMETHAZINE HCL 25 MG/ML IJ SOLN: 6.2500 mg | INTRAMUSCULAR | Status: DC | PRN

## 2012-09-29 SURGICAL SUPPLY — 61 items
APL SKNCLS STERI-STRIP NONHPOA (GAUZE/BANDAGES/DRESSINGS) ×2
BENZOIN TINCTURE PRP APPL 2/3 (GAUZE/BANDAGES/DRESSINGS) ×3 IMPLANT
BLADE HEX COATED 2.75 (ELECTRODE) ×3 IMPLANT
BLADE SURG 15 STRL LF DISP TIS (BLADE) ×2 IMPLANT
BLADE SURG 15 STRL SS (BLADE) ×3
CHLORAPREP W/TINT 26ML (MISCELLANEOUS) ×3 IMPLANT
CLOTH BEACON ORANGE TIMEOUT ST (SAFETY) ×3 IMPLANT
CLSR STERI-STRIP ANTIMIC 1/2X4 (GAUZE/BANDAGES/DRESSINGS) ×2 IMPLANT
DECANTER SPIKE VIAL GLASS SM (MISCELLANEOUS) ×3 IMPLANT
DISSECTOR ROUND CHERRY 3/8 STR (MISCELLANEOUS) ×3 IMPLANT
DRAIN PENROSE 18X1/2 LTX STRL (DRAIN) ×1 IMPLANT
DRAPE LAPAROSCOPIC ABDOMINAL (DRAPES) ×1 IMPLANT
DRAPE LAPAROTOMY T 102X78X121 (DRAPES) ×3 IMPLANT
DRAPE LAPAROTOMY TRNSV 102X78 (DRAPE) ×2 IMPLANT
DRAPE UTILITY XL STRL (DRAPES) ×3 IMPLANT
DRSG TEGADERM 4X4.75 (GAUZE/BANDAGES/DRESSINGS) ×2 IMPLANT
ELECT REM PT RETURN 9FT ADLT (ELECTROSURGICAL) ×3
ELECTRODE REM PT RTRN 9FT ADLT (ELECTROSURGICAL) ×2 IMPLANT
GAUZE SPONGE 4X4 16PLY XRAY LF (GAUZE/BANDAGES/DRESSINGS) ×1 IMPLANT
GLOVE BIO SURGEON STRL SZ7 (GLOVE) ×3 IMPLANT
GLOVE BIOGEL PI IND STRL 7.0 (GLOVE) IMPLANT
GLOVE BIOGEL PI IND STRL 7.5 (GLOVE) ×2 IMPLANT
GLOVE BIOGEL PI INDICATOR 7.0 (GLOVE) ×1
GLOVE BIOGEL PI INDICATOR 7.5 (GLOVE) ×1
GOWN STRL NON-REIN LRG LVL3 (GOWN DISPOSABLE) ×6 IMPLANT
GOWN STRL REIN XL XLG (GOWN DISPOSABLE) ×3 IMPLANT
KIT BASIN OR (CUSTOM PROCEDURE TRAY) ×3 IMPLANT
MESH ULTRAPRO 3X6 7.6X15CM (Mesh General) ×1 IMPLANT
NDL HYPO 25X1 1.5 SAFETY (NEEDLE) ×2 IMPLANT
NEEDLE HYPO 22GX1.5 SAFETY (NEEDLE) ×3 IMPLANT
NEEDLE HYPO 25X1 1.5 SAFETY (NEEDLE) ×3 IMPLANT
NS IRRIG 1000ML POUR BTL (IV SOLUTION) ×2 IMPLANT
PACK BASIC VI WITH GOWN DISP (CUSTOM PROCEDURE TRAY) ×3 IMPLANT
PACK GENERAL/GYN (CUSTOM PROCEDURE TRAY) ×3 IMPLANT
PENCIL BUTTON HOLSTER BLD 10FT (ELECTRODE) ×3 IMPLANT
SPONGE GAUZE 4X4 12PLY (GAUZE/BANDAGES/DRESSINGS) ×4 IMPLANT
SPONGE LAP 4X18 X RAY DECT (DISPOSABLE) ×6 IMPLANT
STRIP CLOSURE SKIN 1/2X4 (GAUZE/BANDAGES/DRESSINGS) ×3 IMPLANT
SUCTION POOLE TIP (SUCTIONS) ×1 IMPLANT
SUT MNCRL AB 4-0 PS2 18 (SUTURE) ×4 IMPLANT
SUT NOVA 0 T19/GS 22DT (SUTURE) ×2 IMPLANT
SUT NOVA NAB DX-16 0-1 5-0 T12 (SUTURE) IMPLANT
SUT NOVA NAB GS-21 0 18 T12 DT (SUTURE) IMPLANT
SUT PROLENE 0 CT 1 CR/8 (SUTURE) IMPLANT
SUT PROLENE 0 CT 2 (SUTURE) IMPLANT
SUT PROLENE 2 0 SH DA (SUTURE) ×4 IMPLANT
SUT SILK 2 0 SH (SUTURE) IMPLANT
SUT SILK 3 0 (SUTURE)
SUT SILK 3-0 18XBRD TIE 12 (SUTURE) IMPLANT
SUT VIC AB 2-0 CT2 27 (SUTURE) IMPLANT
SUT VIC AB 2-0 SH 27 (SUTURE) ×3
SUT VIC AB 2-0 SH 27X BRD (SUTURE) ×2 IMPLANT
SUT VIC AB 3-0 SH 27 (SUTURE) ×9
SUT VIC AB 3-0 SH 27X BRD (SUTURE) IMPLANT
SUT VIC AB 3-0 SH 27XBRD (SUTURE) IMPLANT
SUT VICRYL 0 27 CT2 27 ABS (SUTURE) ×1 IMPLANT
SUT VICRYL 0 UR6 27IN ABS (SUTURE) IMPLANT
SYR CONTROL 10ML LL (SYRINGE) ×3 IMPLANT
TAPE CLOTH SURG 4X10 WHT LF (GAUZE/BANDAGES/DRESSINGS) ×2 IMPLANT
TOWEL OR 17X26 10 PK STRL BLUE (TOWEL DISPOSABLE) ×6 IMPLANT
YANKAUER SUCT BULB TIP NO VENT (SUCTIONS) ×1 IMPLANT

## 2012-09-29 NOTE — H&P (View-Only) (Signed)
Patient ID: Alexander Schmitt, male   DOB: 11-06-1950, 62 y.o.   MRN: [id number]  Chief Complaint  Patient presents with  . New Evaluation    eval RIH    HPI Alexander Schmitt is a 62 y.o. male.  Referred by Dr. Callie Fielding for evaluation of right inguinal hernia HPI This is a 62 year old male who presents with a one-month history of an enlarging uncomfortable bulge in his right groin. This remains reducible. He denies any symptoms in his left groin. It occasionally does have some constipation but this was present prior to noticing the bulge. The patient was diagnosed with pancreatitis and cirrhosis last year. His latest liver function tests were all normal and he has not developed any ascites. He has stopped drinking alcohol completely. He is on maintenance spironolactone. He comes in to discuss right inguinal hernia repair.   Past Medical History  Diagnosis Date  . Intestine disorder     intestinal blockage  . History of stomach ulcers   . Pancreatitis   Cirrhosis  History reviewed. No pertinent past surgical history.  History reviewed. No pertinent family history.  Social History History  Substance Use Topics  . Smoking status: Light Tobacco Smoker -- 0.25 packs/day for 18 years    Types: Cigarettes  . Smokeless tobacco: Never Used     Comment: smokes 1 cig a week  . Alcohol Use: No    No Known Allergies  Current Outpatient Prescriptions  Medication Sig Dispense Refill  . magnesium citrate SOLN Take 0.5 Bottles by mouth as needed. For constipation      . meloxicam (MOBIC) 7.5 MG tablet Take 7.5 mg by mouth daily.      . Multiple Vitamins-Minerals (MULTIVITAMIN) tablet Take 1 tablet by mouth daily.      Marland Kitchen spironolactone (ALDACTONE) 25 MG tablet Take 25 mg by mouth daily.       No current facility-administered medications for this visit.    Review of Systems Review of Systems  Constitutional: Negative for fever, chills and unexpected weight change.  HENT: Negative for  hearing loss, congestion, sore throat, trouble swallowing and voice change.   Eyes: Negative for visual disturbance.  Respiratory: Negative for cough and wheezing.   Cardiovascular: Negative for chest pain, palpitations and leg swelling.  Gastrointestinal: Negative for nausea, vomiting, abdominal pain, diarrhea, constipation, blood in stool, abdominal distention, anal bleeding and rectal pain.  Genitourinary: Positive for scrotal swelling. Negative for hematuria and difficulty urinating.  Musculoskeletal: Negative for arthralgias.  Skin: Negative for rash and wound.  Neurological: Negative for seizures, syncope, weakness and headaches.  Hematological: Negative for adenopathy. Does not bruise/bleed easily.  Psychiatric/Behavioral: Negative for confusion.    Blood pressure 130/78, pulse 72, temperature 98.1 F (36.7 C), temperature source Temporal, resp. rate 16, height 6' (1.829 m), weight 161 lb (73.029 kg), SpO2 97.00%.  Physical Exam Physical Exam WDWN in NAD HEENT:  EOMI, sclera anicteric Neck:  No masses, no thyromegaly Lungs:  CTA bilaterally; normal respiratory effort CV:  Regular rate and rhythm; no murmurs Abd:  +bowel sounds, soft, non-tender, small palpable umbilical hernia GU:  Bilateral descended testes; no testicular masses; large visible reducible right inguinal hernia; no sign of left inguinal hernia Ext:  Well-perfused; no edema Skin:  Warm, dry; no sign of jaundice  Data Reviewed none  Assessment    Umbilical hernia Right inguinal hernia - reducible     Plan    Right inguinal hernia repair with mesh.  Umbilical hernia repair -  The surgical procedure has been discussed with the patient.  Potential risks, benefits, alternative treatments, and expected outcomes have been explained.  All of the patient's questions at this time have been answered.  The likelihood of reaching the patient's treatment goal is good.  The patient understand the proposed surgical  procedure and wishes to proceed.         Mi Balla K. 09/20/2012, 11:52 AM

## 2012-09-29 NOTE — Transfer of Care (Signed)
Immediate Anesthesia Transfer of Care Note  Patient: Alexander Schmitt  Procedure(s) Performed: Procedure(s): HERNIA REPAIR INGUINAL ADULT (Right) HERNIA REPAIR UMBILICAL ADULT (N/A) INSERTION OF MESH (Right)  Patient Location: PACU  Anesthesia Type:General  Level of Consciousness: awake and alert   Airway & Oxygen Therapy: Patient Spontanous Breathing and Patient connected to face mask oxygen  Post-op Assessment: Report given to PACU RN and Post -op Vital signs reviewed and stable  Post vital signs: Reviewed and stable  Complications: No apparent anesthesia complications

## 2012-09-29 NOTE — Op Note (Signed)
Hernia, Open, Procedure Note  Indications: The patient presented with a history of a right, reducible inguinal hernia and a small umbilical hernia.    Pre-operative Diagnosis: right reducible inguinal hernia and umbilical hernia Post-operative Diagnosis: same  Surgeon: Alexander Schmitt.   Assistants: none  Anesthesia: General endotracheal anesthesia  ASA Class: 3  Procedure Details  The patient was seen again in the Holding Room. The risks, benefits, complications, treatment options, and expected outcomes were discussed with the patient. The possibilities of reaction to medication, pulmonary aspiration, perforation of viscus, bleeding, recurrent infection, the need for additional procedures, and development of a complication requiring transfusion or further operation were discussed with the patient and/or family. The likelihood of success in repairing the hernia and returning the patient to their previous functional status is good.  There was concurrence with the proposed plan, and informed consent was obtained. The site of surgery was properly noted/marked. The patient was taken to the Operating Room, identified as Alexander Schmitt, and the procedure verified as right inguinal hernia repair and umbilical hernia. A Time Out was held and the above information confirmed.  The patient was placed in the supine position and underwent induction of anesthesia. The lower abdomen and groin was prepped with Chloraprep and draped in the standard fashion, and 0.5% Marcaine with epinephrine was used to anesthetize the skin over the mid-portion of the inguinal canal. An oblique incision was made. Dissection was carried down through the subcutaneous tissue with cautery to the external oblique fascia.  We opened the external oblique fascia along the direction of its fibers to the external ring.  The spermatic cord was circumferentially dissected bluntly and retracted with a Penrose drain.  The floor of the inguinal  canal was inspected and was intact.  We skeletonized the spermatic cord and reduced a small indirect hernia sac and cord lipoma.  We encountered some milky appearing ascites that leaked through a tiny pinhole in the hernia sac.  We used a 3 x 6 inch piece of Ultrapro mesh, which was cut into a keyhole shape.  This was secured with 2-0 Prolene, beginning at the pubic tubercle, running this along the internal oblique fascia superiorly and the shelving edge inferiorly.  The tails of the mesh were sutured together behind the spermatic cord.  The mesh was tucked underneath the external oblique fascia laterally.  The external oblique fascia was reapproximated with 2-0 Vicryl.  3-0 Vicryl was used to close the subcutaneous tissues and 4-0 Monocryl was used to close the skin in subcuticular fashion.   We then turned our attention to the umbilical hernia.   We made a transverse incision above the umbilicus.  Dissection was carried down to the hernia sac with cautery.  We dissected bluntly around the hernia sac down to the edge of the fascial defect.  The fascial defect measured less than 1 cm across.  We cleared the fascia in all directions.  Again, some milky ascites was noted from the peritoneal cavity. The fascial defect was closed with multiple interrupted figure-of-eight 1 Novofil sutures.  The base of the umbilicus was tacked down with 3-0 Vicryl.  3-0 Vicryl was used to close the subcutaneous tissues and 4-0 Monocryl was used to close the skin.  Steri-strips and clean dressing were applied.  The patient was extubated and brought to the recovery room in stable condition.  All sponge, instrument, and needle counts were correct prior to closure and at the conclusion of the case.  Estimated Blood Loss:  Minimal                 Complications: None; patient tolerated the procedure well.         Disposition: PACU - hemodynamically stable.         Condition: stable  Alexander Schmitt. Alexander Skains, MD, [facility]  Surgery  09/29/2012 10:13 AM

## 2012-09-29 NOTE — Interval H&P Note (Signed)
History and Physical Interval Note:  09/29/2012 7:34 AM  Alexander Schmitt  has presented today for surgery, with the diagnosis of right inguinal hernia umbilical hernia   The various methods of treatment have been discussed with the patient and family. After consideration of risks, benefits and other options for treatment, the patient has consented to  Procedure(s): HERNIA REPAIR INGUINAL ADULT (Right) HERNIA REPAIR UMBILICAL ADULT (N/A) INSERTION OF MESH (N/A) as a surgical intervention .  The patient's history has been reviewed, patient examined, no change in status, stable for surgery.  I have reviewed the patient's chart and labs.  Questions were answered to the patient's satisfaction.     Stokes Rattigan K.

## 2012-09-29 NOTE — Anesthesia Postprocedure Evaluation (Signed)
  Anesthesia Post-op Note  Patient: Alexander Schmitt  Procedure(s) Performed: Procedure(s) (LRB): HERNIA REPAIR INGUINAL ADULT (Right) HERNIA REPAIR UMBILICAL ADULT (N/A) INSERTION OF MESH (Right)  Patient Location: PACU  Anesthesia Type: General  Level of Consciousness: awake and alert   Airway and Oxygen Therapy: Patient Spontanous Breathing  Post-op Pain: mild  Post-op Assessment: Post-op Vital signs reviewed, Patient's Cardiovascular Status Stable, Respiratory Function Stable, Patent Airway and No signs of Nausea or vomiting  Last Vitals:  Filed Vitals:   09/29/12 1045  BP: 128/75  Pulse: 61  Temp:   Resp: 11    Post-op Vital Signs: stable   Complications: No apparent anesthesia complications

## 2012-09-30 ENCOUNTER — Encounter (HOSPITAL_COMMUNITY): Payer: Self-pay | Admitting: Surgery

## 2012-10-14 ENCOUNTER — Encounter (INDEPENDENT_AMBULATORY_CARE_PROVIDER_SITE_OTHER): Payer: PRIVATE HEALTH INSURANCE | Admitting: Surgery

## 2012-11-01 ENCOUNTER — Encounter (INDEPENDENT_AMBULATORY_CARE_PROVIDER_SITE_OTHER): Payer: PRIVATE HEALTH INSURANCE | Admitting: Surgery

## 2012-11-18 ENCOUNTER — Encounter (INDEPENDENT_AMBULATORY_CARE_PROVIDER_SITE_OTHER): Payer: Self-pay | Admitting: Surgery

## 2013-05-02 ENCOUNTER — Inpatient Hospital Stay (HOSPITAL_COMMUNITY)
Admission: EM | Admit: 2013-05-02 | Discharge: 2013-05-05 | DRG: 439 | Disposition: A | Discharge status: Home / Self Care | Payer: Medicaid Other | Attending: Internal Medicine | Admitting: Internal Medicine

## 2013-05-02 ENCOUNTER — Encounter (HOSPITAL_COMMUNITY): Payer: Self-pay | Admitting: Emergency Medicine

## 2013-05-02 DIAGNOSIS — K859 Acute pancreatitis without necrosis or infection, unspecified: Secondary | ICD-10-CM

## 2013-05-02 DIAGNOSIS — Z8711 Personal history of peptic ulcer disease: Secondary | ICD-10-CM

## 2013-05-02 DIAGNOSIS — E440 Moderate protein-calorie malnutrition: Secondary | ICD-10-CM | POA: Diagnosis present

## 2013-05-02 DIAGNOSIS — Z5987 Material hardship due to limited financial resources, not elsewhere classified: Secondary | ICD-10-CM | POA: Diagnosis present

## 2013-05-02 DIAGNOSIS — Z598 Other problems related to housing and economic circumstances: Secondary | ICD-10-CM | POA: Diagnosis present

## 2013-05-02 DIAGNOSIS — K7030 Alcoholic cirrhosis of liver without ascites: Secondary | ICD-10-CM | POA: Diagnosis present

## 2013-05-02 DIAGNOSIS — K219 Gastro-esophageal reflux disease without esophagitis: Secondary | ICD-10-CM | POA: Diagnosis present

## 2013-05-02 DIAGNOSIS — Z79899 Other long term (current) drug therapy: Secondary | ICD-10-CM

## 2013-05-02 DIAGNOSIS — K861 Other chronic pancreatitis: Secondary | ICD-10-CM | POA: Diagnosis present

## 2013-05-02 DIAGNOSIS — F1010 Alcohol abuse, uncomplicated: Secondary | ICD-10-CM | POA: Diagnosis present

## 2013-05-02 DIAGNOSIS — K7460 Unspecified cirrhosis of liver: Secondary | ICD-10-CM | POA: Diagnosis present

## 2013-05-02 DIAGNOSIS — K862 Cyst of pancreas: Secondary | ICD-10-CM | POA: Diagnosis present

## 2013-05-02 DIAGNOSIS — M129 Arthropathy, unspecified: Secondary | ICD-10-CM | POA: Diagnosis present

## 2013-05-02 DIAGNOSIS — K863 Pseudocyst of pancreas: Secondary | ICD-10-CM | POA: Diagnosis present

## 2013-05-02 DIAGNOSIS — F17200 Nicotine dependence, unspecified, uncomplicated: Secondary | ICD-10-CM | POA: Diagnosis present

## 2013-05-02 DIAGNOSIS — F1011 Alcohol abuse, in remission: Secondary | ICD-10-CM

## 2013-05-02 LAB — CBC WITH DIFFERENTIAL/PLATELET
Eosinophils Absolute: 0.2 10*3/uL (ref 0.0–0.7)
Eosinophils Relative: 3 % (ref 0–5)
HCT: 44.2 % (ref 39.0–52.0)
Hemoglobin: 14.8 g/dL (ref 13.0–17.0)
Lymphs Abs: 4.9 10*3/uL — ABNORMAL HIGH (ref 0.7–4.0)
MCH: 31.8 pg (ref 26.0–34.0)
MCV: 95.1 fL (ref 78.0–100.0)
Monocytes Relative: 6 % (ref 3–12)
RBC: 4.65 MIL/uL (ref 4.22–5.81)

## 2013-05-02 LAB — COMPREHENSIVE METABOLIC PANEL
Alkaline Phosphatase: 87 U/L (ref 39–117)
BUN: 7 mg/dL (ref 6–23)
CO2: 28 mEq/L (ref 19–32)
Calcium: 8.9 mg/dL (ref 8.4–10.5)
GFR calc Af Amer: >90 mL/min (ref >90)
GFR calc non Af Amer: >90 mL/min (ref >90)
Glucose, Bld: 146 mg/dL — ABNORMAL HIGH (ref 70–99)
Potassium: 3.8 mEq/L (ref 3.5–5.1)
Total Protein: 6.9 g/dL (ref 6.0–8.3)

## 2013-05-02 LAB — LIPASE, BLOOD: Lipase: 961 U/L — ABNORMAL HIGH (ref 11–59)

## 2013-05-02 NOTE — ED Notes (Signed)
Pt c/o epigastric pain x 30 min with radiation to right shoulder

## 2013-05-03 ENCOUNTER — Inpatient Hospital Stay (HOSPITAL_COMMUNITY): Payer: Medicaid Other

## 2013-05-03 ENCOUNTER — Encounter (HOSPITAL_COMMUNITY): Payer: Self-pay | Admitting: Internal Medicine

## 2013-05-03 DIAGNOSIS — F1011 Alcohol abuse, in remission: Secondary | ICD-10-CM

## 2013-05-03 DIAGNOSIS — K7460 Unspecified cirrhosis of liver: Secondary | ICD-10-CM

## 2013-05-03 DIAGNOSIS — K859 Acute pancreatitis without necrosis or infection, unspecified: Principal | ICD-10-CM

## 2013-05-03 LAB — CBC
HCT: 40.1 % (ref 39.0–52.0)
MCH: 31.4 pg (ref 26.0–34.0)
MCHC: 33.4 g/dL (ref 30.0–36.0)
Platelets: 342 10*3/uL (ref 150–400)
RDW: 13.1 % (ref 11.5–15.5)
WBC: 13 10*3/uL — ABNORMAL HIGH (ref 4.0–10.5)

## 2013-05-03 LAB — RAPID URINE DRUG SCREEN, HOSP PERFORMED
Cocaine: NOT DETECTED
Opiates: POSITIVE — AB
Tetrahydrocannabinol: NOT DETECTED

## 2013-05-03 LAB — LIPID PANEL
HDL: 32 mg/dL — ABNORMAL LOW (ref >39)
LDL Cholesterol: 78 mg/dL (ref 0–99)
Total CHOL/HDL Ratio: 3.8 RATIO
Triglycerides: 62 mg/dL (ref <150)
VLDL: 12 mg/dL (ref 0–40)

## 2013-05-03 LAB — COMPREHENSIVE METABOLIC PANEL
ALT: 17 U/L (ref 0–53)
AST: 20 U/L (ref 0–37)
Alkaline Phosphatase: 76 U/L (ref 39–117)
BUN: 7 mg/dL (ref 6–23)
CO2: 26 mEq/L (ref 19–32)
GFR calc Af Amer: >90 mL/min (ref >90)
GFR calc non Af Amer: >90 mL/min (ref >90)
Glucose, Bld: 108 mg/dL — ABNORMAL HIGH (ref 70–99)
Potassium: 4.9 mEq/L (ref 3.5–5.1)
Sodium: 140 mEq/L (ref 135–145)
Total Protein: 6.2 g/dL (ref 6.0–8.3)

## 2013-05-03 LAB — MAGNESIUM: Magnesium: 2.1 mg/dL (ref 1.5–2.5)

## 2013-05-03 LAB — PHOSPHORUS: Phosphorus: 3.3 mg/dL (ref 2.3–4.6)

## 2013-05-03 LAB — ETHANOL: Alcohol, Ethyl (B): <11 mg/dL (ref 0–11)

## 2013-05-03 MED ORDER — VITAMIN B-1 100 MG PO TABS
100.0000 mg | ORAL_TABLET | Freq: Every day | ORAL | Status: DC
  Administered 2013-05-03 – 2013-05-05 (×3): 100 mg via ORAL
  Filled 2013-05-03 (×3): qty 1

## 2013-05-03 MED ORDER — HYDROCODONE-ACETAMINOPHEN 5-325 MG PO TABS: 1.0000 | ORAL_TABLET | ORAL | Status: DC | PRN

## 2013-05-03 MED ORDER — SENNA 8.6 MG PO TABS
1.0000 | ORAL_TABLET | Freq: Two times a day (BID) | ORAL | Status: DC
  Administered 2013-05-03 – 2013-05-05 (×5): 8.6 mg via ORAL
  Filled 2013-05-03 (×7): qty 1

## 2013-05-03 MED ORDER — FOLIC ACID 1 MG PO TABS
1.0000 mg | ORAL_TABLET | Freq: Every day | ORAL | Status: DC
  Administered 2013-05-03 – 2013-05-05 (×3): 1 mg via ORAL
  Filled 2013-05-03 (×3): qty 1

## 2013-05-03 MED ORDER — THIAMINE HCL 100 MG/ML IJ SOLN
100.0000 mg | Freq: Every day | INTRAMUSCULAR | Status: DC
  Administered 2013-05-03: 100 mg via INTRAVENOUS
  Filled 2013-05-03: qty 1

## 2013-05-03 MED ORDER — DOCUSATE SODIUM 100 MG PO CAPS
100.0000 mg | ORAL_CAPSULE | Freq: Two times a day (BID) | ORAL | Status: DC
  Administered 2013-05-03 – 2013-05-05 (×4): 100 mg via ORAL
  Filled 2013-05-03 (×7): qty 1

## 2013-05-03 MED ORDER — MAGNESIUM CITRATE PO SOLN: 0.5000 | ORAL | Status: DC | PRN

## 2013-05-03 MED ORDER — ACETAMINOPHEN 650 MG RE SUPP: 650.0000 mg | Freq: Four times a day (QID) | RECTAL | Status: DC | PRN

## 2013-05-03 MED ORDER — ACETAMINOPHEN 325 MG PO TABS: 650.0000 mg | ORAL_TABLET | Freq: Four times a day (QID) | ORAL | Status: DC | PRN

## 2013-05-03 MED ORDER — SODIUM CHLORIDE 0.9 % IV SOLN: INTRAVENOUS | Status: AC

## 2013-05-03 MED ORDER — MORPHINE SULFATE 4 MG/ML IJ SOLN
4.0000 mg | Freq: Once | INTRAMUSCULAR | Status: AC
  Administered 2013-05-03: 4 mg via INTRAVENOUS
  Filled 2013-05-03: qty 1

## 2013-05-03 MED ORDER — SODIUM CHLORIDE 0.9 % IJ SOLN
3.0000 mL | Freq: Two times a day (BID) | INTRAMUSCULAR | Status: DC
  Administered 2013-05-03: 3 mL via INTRAVENOUS

## 2013-05-03 MED ORDER — ALBUTEROL SULFATE (5 MG/ML) 0.5% IN NEBU: 2.5000 mg | INHALATION_SOLUTION | RESPIRATORY_TRACT | Status: DC | PRN

## 2013-05-03 MED ORDER — MORPHINE SULFATE 2 MG/ML IJ SOLN: 2.0000 mg | INTRAMUSCULAR | Status: DC | PRN

## 2013-05-03 MED ORDER — FLEET ENEMA 7-19 GM/118ML RE ENEM
1.0000 | ENEMA | Freq: Once | RECTAL | Status: AC | PRN
  Filled 2013-05-03: qty 1

## 2013-05-03 MED ORDER — SODIUM CHLORIDE 0.9 % IV SOLN
INTRAVENOUS | Status: AC
  Administered 2013-05-03 (×2): via INTRAVENOUS

## 2013-05-03 MED ORDER — SODIUM CHLORIDE 0.9 % IV BOLUS (SEPSIS)
500.0000 mL | Freq: Once | INTRAVENOUS | Status: AC
  Administered 2013-05-03: 500 mL via INTRAVENOUS

## 2013-05-03 MED ORDER — ONDANSETRON HCL 4 MG/2ML IJ SOLN: 4.0000 mg | Freq: Four times a day (QID) | INTRAMUSCULAR | Status: DC | PRN

## 2013-05-03 MED ORDER — ONDANSETRON HCL 4 MG/2ML IJ SOLN
4.0000 mg | Freq: Once | INTRAMUSCULAR | Status: AC
  Administered 2013-05-03: 4 mg via INTRAVENOUS
  Filled 2013-05-03: qty 2

## 2013-05-03 MED ORDER — IOHEXOL 300 MG/ML  SOLN
25.0000 mL | INTRAMUSCULAR | Status: AC
  Administered 2013-05-03 (×2): 25 mL via ORAL

## 2013-05-03 MED ORDER — IOHEXOL 300 MG/ML  SOLN
100.0000 mL | Freq: Once | INTRAMUSCULAR | Status: AC | PRN
  Administered 2013-05-03: 100 mL via INTRAVENOUS

## 2013-05-03 MED ORDER — POLYETHYLENE GLYCOL 3350 17 G PO PACK
17.0000 g | PACK | Freq: Every day | ORAL | Status: DC | PRN
  Filled 2013-05-03: qty 1

## 2013-05-03 MED ORDER — ONDANSETRON HCL 4 MG PO TABS: 4.0000 mg | ORAL_TABLET | Freq: Four times a day (QID) | ORAL | Status: DC | PRN

## 2013-05-03 NOTE — Progress Notes (Addendum)
INITIAL NUTRITION ASSESSMENT  DOCUMENTATION CODES Per approved criteria  -Non-severe (moderate) malnutrition in the context of chronic illness   INTERVENTION: Advance diet as medically appropriate  RD to follow for nutrition care plan, add interventions accordingly  NUTRITION DIAGNOSIS: Increased nutrient needs related to alcoholic pancreatitis as evidenced by estimated nutrition needs  Goal: Pt to meet >/= 90% of their estimated nutrition needs   Monitor:  PO diet advancement & intake, weight, labs, I/O's   Reason for Assessment: Malnutrition Screening Tool Report  62 y.o. male  Admitting Dx: alcoholic pancreatitis   ASSESSMENT: Patient with PMH of stomach ulcers, GERD and cirrhosis admitted with severe abdominal pain in the epigastric region radiating to his back and shoulder; + nausea & vomiting; found to have elevated lipase.  Patient reports his appetite was poor PTA; drinks Ensure supplements at home "when I can afford them;" RD suspects patient not meeting protein needs given alcohol abuse; patient with visible moderate muscle loss to upper body; weight stable per readings; RD to add nutrition supplements when/as able.  Patient meets criteria for non-severe (moderate) malnutrition in the context of chronic illness as evidenced by < 75% intake of estimated energy requirement for > 1 month and moderate muscle loss (temples, clavicles, acromion bone region).  Height: Ht Readings from Last 1 Encounters:  05/03/13 6' (1.829 m)    Weight: Wt Readings from Last 1 Encounters:  05/03/13 157 lb 10.1 oz (71.5 kg)    Ideal Body Weight: 178 lb  % Ideal Body Weight: 88%  Wt Readings from Last 10 Encounters:  05/03/13 157 lb 10.1 oz (71.5 kg)  09/23/12 160 lb 3.2 oz (72.666 kg)  09/20/12 161 lb (73.029 kg)  05/24/12 163 lb 3.2 oz (74.027 kg)  11/24/11 160 lb 11.5 oz (72.9 kg)    Usual Body Weight: 160 lb  % Usual Body Weight: 98%  BMI:  Body mass index is 21.37  kg/(m^2).  Estimated Nutritional Needs: Kcal: 2000-2200 Protein: 100-110 gm Fluid: 2.0-2.2 L  Skin: Intact  Diet Order: NPO  EDUCATION NEEDS: -No education needs identified at this time   Intake/Output Summary (Last 24 hours) at 05/03/13 1215 Last data filed at 05/03/13 0600  Gross per 24 hour  Intake 268.75 ml  Output    300 ml  Net -31.25 ml    Labs:   Recent Labs Lab 05/02/13 1840 05/03/13 0355  NA 139 140  K 3.8 4.9  CL 102 104  CO2 28 26  BUN 7 7  CREATININE 0.84 0.76  CALCIUM 8.9 8.5  MG  --  2.1  PHOS  --  3.3  GLUCOSE 146* 108*    Scheduled Meds: . docusate sodium  100 mg Oral BID  . folic acid  1 mg Oral Daily  . senna  1 tablet Oral BID  . sodium chloride  3 mL Intravenous Q12H  . thiamine  100 mg Oral Daily    Continuous Infusions: . sodium chloride 125 mL/hr at 05/03/13 1050    Past Medical History  Diagnosis Date  . Intestine disorder     intestinal blockage  . History of stomach ulcers   . Pancreatitis   . GERD (gastroesophageal reflux disease)     occasional   . Arthritis     left shoulder   . Cirrhosis     hx of     Past Surgical History  Procedure Laterality Date  . Inguinal hernia repair Right 09/29/2012    Procedure: HERNIA REPAIR INGUINAL ADULT;  Surgeon: [name]. Tsuei, MD;  Location: [facility];  Service: General;  Laterality: Right;  . Umbilical hernia repair N/A 09/29/2012    Procedure: HERNIA REPAIR UMBILICAL ADULT;  Surgeon: [name]. [name], MD;  Location: [facility];  Service: General;  Laterality: N/A;  . Insertion of mesh Right 09/29/2012    Procedure: INSERTION OF MESH;  Surgeon: [name]. [name], MD;  Location: [facility];  Service: General;  Laterality: Right;    Maureen Chatters, RD, LDN Pager #: [phone] After-Hours Pager #: [phone]

## 2013-05-03 NOTE — H&P (Addendum)
PCP:  Callie Fielding, MD    Chief Complaint:  Epigastric pain  HPI: Alexander Schmitt is a 62 y.o. male   has a past medical history of Intestine disorder; History of stomach ulcers; Pancreatitis; GERD (gastroesophageal reflux disease); Arthritis; and Cirrhosis.   Presented with  Patient has a history of cirrhosis likely secondary to alcohol abuse. Reports quitting drinking 2 weeks ago hadn't had any shakes or signs of severe withdrawal. States today he started to have severe abdominal pain in epigastric region radiating to the back and right shoulder. He started to have poor appetite nausea and vomited an emerge department. He was evaluated and was found to have elevated lipase and 900s. At which point hospitalist was called for admission for pancreatitis. Of note from January 2014 he had had an abnormal CAT scan worrisome for pseudocyst versus pancreatic neoplasm as well as duodenal thickening. I do not see any repeat imaging from that. Patient had had trouble with followup due to financial difficulties.  Review of Systems:    Pertinent positives include: abdominal pain, nausea, vomiting, constipation  Constitutional:  No weight loss, night sweats, Fevers, chills, fatigue, weight loss  HEENT:  No headaches, Difficulty swallowing,Tooth/dental problems,Sore throat,  No sneezing, itching, ear ache, nasal congestion, post nasal drip,  Cardio-vascular:  No chest pain, Orthopnea, PND, anasarca, dizziness, palpitations.no Bilateral lower extremity swelling  GI:  No heartburn, indigestion, diarrhea, change in bowel habits, loss of appetite, melena, blood in stool, hematemesis Resp:  no shortness of breath at rest. No dyspnea on exertion, No excess mucus, no productive cough, No non-productive cough, No coughing up of blood.No change in color of mucus.No wheezing. Skin:  no rash or lesions. No jaundice GU:  no dysuria, change in color of urine, no urgency or frequency. No straining to urinate.   No flank pain.  Musculoskeletal:  No joint pain or no joint swelling. No decreased range of motion. No back pain.  Psych:  No change in mood or affect. No depression or anxiety. No memory loss.  Neuro: no localizing neurological complaints, no tingling, no weakness, no double vision, no gait abnormality, no slurred speech, no confusion  Otherwise ROS are negative except for above, 10 systems were reviewed  Past Medical History: Past Medical History  Diagnosis Date  . Intestine disorder     intestinal blockage  . History of stomach ulcers   . Pancreatitis   . GERD (gastroesophageal reflux disease)     occasional   . Arthritis     left shoulder   . Cirrhosis     hx of    Past Surgical History  Procedure Laterality Date  . Inguinal hernia repair Right 09/29/2012    Procedure: HERNIA REPAIR INGUINAL ADULT;  Surgeon: [name]. Tsuei, MD;  Location: [facility];  Service: General;  Laterality: Right;  . Umbilical hernia repair N/A 09/29/2012    Procedure: HERNIA REPAIR UMBILICAL ADULT;  Surgeon: [name]. [name], MD;  Location: [facility];  Service: General;  Laterality: N/A;  . Insertion of mesh Right 09/29/2012    Procedure: INSERTION OF MESH;  Surgeon: [name]. [name], MD;  Location: [facility];  Service: General;  Laterality: Right;     Medications: Prior to Admission medications   Medication Sig Start Date End Date Taking? Authorizing Provider  magnesium citrate SOLN Take 0.5 Bottles by mouth as needed. For constipation   Yes Historical Provider, MD  polyethylene glycol (MIRALAX / GLYCOLAX) packet Take 17 g by mouth daily as needed (  for constipation).   Yes Historical Provider, MD    Allergies:  No Known Allergies  Social History:  Ambulatory  independently  Lives at  home with family   reports that he has been smoking Cigarettes.  He has a 10 pack-year smoking history. He has never used smokeless tobacco. He reports that he does not drink alcohol or use illicit drugs.   Family  History: family history includes Hypertension in his father.    Physical Exam: Patient Vitals for the past 24 hrs:  BP Temp Temp src Pulse Resp SpO2 Weight  05/03/13 0145 135/81 mmHg - - 69 - 97 % -  05/03/13 0045 162/74 mmHg - - 75 - 98 % -  05/02/13 2236 112/72 mmHg 97.6 F (36.4 C) Oral 78 18 97 % -  05/02/13 1823 175/86 mmHg 97.9 F (36.6 C) Oral 74 17 98 % 72.576 kg (160 lb)    1. General:  in No Acute distress 2. Psychological: Alert and Oriented 3. Head/ENT:   Moist Mucous Membranes                          Head Non traumatic, neck supple                           Poor Dentition 4. SKIN:  decreased Skin turgor,  Skin clean Dry and intact no rash 5. Heart: Regular rate and rhythm no Murmur, Rub or gallop 6. Lungs: Clear to auscultation bilaterally, no wheezes or crackles   7. Abdomen: Soft, epigastric region tenderness, Non distended 8. Lower extremities: no clubbing, cyanosis, or edema 9. Neurologically Grossly intact, moving all 4 extremities equally 10. MSK: Normal range of motion  body mass index is 21.7 kg/(m^2).   Labs on Admission:   Recent Labs  05/02/13 1840  NA 139  K 3.8  CL 102  CO2 28  GLUCOSE 146*  BUN 7  CREATININE 0.84  CALCIUM 8.9    Recent Labs  05/02/13 1840  AST 28  ALT 21  ALKPHOS 87  BILITOT 0.2*  PROT 6.9  ALBUMIN 3.6    Recent Labs  05/02/13 1840  LIPASE 961*    Recent Labs  05/02/13 1840  WBC 7.3  NEUTROABS 1.8  HGB 14.8  HCT 44.2  MCV 95.1  PLT 336   No results found for this basename: CKTOTAL, CKMB, CKMBINDEX, TROPONINI,  in the last 72 hours No results found for this basename: TSH, T4TOTAL, FREET3, T3FREE, THYROIDAB,  in the last 72 hours No results found for this basename: VITAMINB12, FOLATE, FERRITIN, TIBC, IRON, RETICCTPCT,  in the last 72 hours Lab Results  Component Value Date   HGBA1C 5.6 11/22/2011    The CrCl is unknown because both a height and weight (above a minimum accepted value) are required  for this calculation. ABG No results found for this basename: phart, pco2, po2, hco3, tco2, acidbasedef, o2sat     No results found for this basename: DDIMER     Other results:  I have pearsonaly reviewed this: ECG REPORT  Rate: 83  Rhythm: Incomplete right bundle branch block ST&T Change: No ischemia   Cultures:    Component Value Date/Time   SDES URINE, CLEAN CATCH 07/25/2012 1709   SPECREQUEST NONE 07/25/2012 1709   CULT NO GROWTH 07/25/2012 1709   REPTSTATUS 07/26/2012 FINAL 07/25/2012 1709       Radiological Exams on Admission: No results found.  Chart has been reviewed  Assessment/Plan 62 year old gentleman with history of alcohol abuse self-reported, chronic pancreatitis presents with possible acute pancreatitis  Present on Admission:  . Pancreatitis - make n.p.o., give IV fluids, pain control, given history of normal CT scan of past we'll obtain CT of abdomen to evaluate for pseudocyst versus neoplasm  . Cirrhosis - appears to be stable no significant ascites present albumin 3.6  . Alcohol abuse, in remission - patient states he was a heavy drinker but currently quit past 2 weeks no signs of  withdrawal    Prophylaxis: SCD Protonix  CODE STATUS: Full code  Other plan as per orders.  I have spent a total of 55 min on this admission  Payten Hobin 05/03/2013, 2:32 AM

## 2013-05-03 NOTE — Care Management Note (Unsigned)
    Page 1 of 1   05/03/2013     11:31:43 AM   CARE MANAGEMENT NOTE 05/03/2013  Patient:  Alexander Schmitt, Alexander Schmitt   Account Number:  [id number]  Date Initiated:  05/03/2013  Documentation initiated by:  Leeza Heiner  Subjective/Objective Assessment:   PT ADM ON 05/02/13 WITH ETOH PANCREATITIS.  PTA, PT INDEPENDENT, LIVES WITH SPOUSE.     Action/Plan:   WILL CONSULT CSW TO COUNSEL FOR ETOH USE.  WILL FOLLOW FOR DISCHARGE NEEDS AS PT PROGRESSES.   Anticipated DC Date:  05/06/2013   Anticipated DC Plan:  HOME/SELF CARE  In-house referral  Clinical Social Worker      DC Planning Services  CM consult      Choice offered to / List presented to:             Status of service:  In process, will continue to follow Medicare Important Message given?   (If response is "NO", the following Medicare IM given date fields will be blank) Date Medicare IM given:   Date Additional Medicare IM given:    Discharge Disposition:    Per UR Regulation:  Reviewed for med. necessity/level of care/duration of stay  If discussed at Long Length of Stay Meetings, dates discussed:    Comments:

## 2013-05-03 NOTE — ED Provider Notes (Signed)
CSN: [id number]     Arrival date & time 05/02/13  1814 History   First MD Initiated Contact with Patient 05/02/13 2350     Chief Complaint  Patient presents with  . Abdominal Pain   (Consider location/radiation/quality/duration/timing/severity/associated sxs/prior Treatment) Patient is a 62 y.o. male presenting with abdominal pain. The history is provided by the patient.  Abdominal Pain Pain location:  Epigastric Pain quality: sharp   Pain radiates to:  R shoulder Pain severity:  Severe Onset quality:  Sudden Timing:  Constant Progression:  Unchanged Context: not alcohol use   Relieved by:  Nothing Worsened by:  Nothing tried Ineffective treatments:  None tried Associated symptoms: nausea and vomiting   Associated symptoms: no fever   Risk factors: no alcohol abuse and no recent hospitalization     Past Medical History  Diagnosis Date  . Intestine disorder     intestinal blockage  . History of stomach ulcers   . Pancreatitis   . GERD (gastroesophageal reflux disease)     occasional   . Arthritis     left shoulder   . Cirrhosis     hx of    Past Surgical History  Procedure Laterality Date  . Inguinal hernia repair Right 09/29/2012    Procedure: HERNIA REPAIR INGUINAL ADULT;  Surgeon: [name]. Tsuei, MD;  Location: [facility];  Service: General;  Laterality: Right;  . Umbilical hernia repair N/A 09/29/2012    Procedure: HERNIA REPAIR UMBILICAL ADULT;  Surgeon: [name]. [name], MD;  Location: [facility];  Service: General;  Laterality: N/A;  . Insertion of mesh Right 09/29/2012    Procedure: INSERTION OF MESH;  Surgeon: [name]. [name], MD;  Location: [facility];  Service: General;  Laterality: Right;   History reviewed. No pertinent family history. History  Substance Use Topics  . Smoking status: Light Tobacco Smoker -- 0.25 packs/day for 40 years    Types: Cigarettes  . Smokeless tobacco: Never Used  . Alcohol Use: No    Review of Systems  Constitutional: Negative for  fever.  Gastrointestinal: Positive for nausea, vomiting and abdominal pain.  All other systems reviewed and are negative.    Allergies  Review of patient's allergies indicates no known allergies.  Home Medications   Current Outpatient Rx  Name  Route  Sig  Dispense  Refill  . magnesium citrate SOLN   Oral   Take 0.5 Bottles by mouth as needed. For constipation         . polyethylene glycol (MIRALAX / GLYCOLAX) packet   Oral   Take 17 g by mouth daily as needed (for constipation).          BP 112/72  Pulse 78  Temp(Src) 97.6 F (36.4 C) (Oral)  Resp 18  Wt 160 lb (72.576 kg)  BMI 21.7 kg/m2  SpO2 97% Physical Exam  Constitutional: He is oriented to person, place, and time. He appears well-developed and well-nourished. No distress.  HENT:  Head: Normocephalic and atraumatic.  Mouth/Throat: Oropharynx is clear and moist.  Eyes: Conjunctivae are normal. Pupils are equal, round, and reactive to light.  Neck: Normal range of motion. Neck supple.  Cardiovascular: Normal rate, regular rhythm and intact distal pulses.   Pulmonary/Chest: Effort normal and breath sounds normal. He has no wheezes. He has no rales.  Abdominal: Soft. Bowel sounds are normal. He exhibits no distension and no mass. There is tenderness. There is no rebound and no guarding.  Musculoskeletal: Normal range of motion.  Neurological:  He is alert and oriented to person, place, and time.  Skin: Skin is warm.  Psychiatric: He has a normal mood and affect.    ED Course  Procedures (including critical care time) Labs Review Labs Reviewed  CBC WITH DIFFERENTIAL - Abnormal; Notable for the following:    Neutrophils Relative % 25 (*)    Lymphocytes Relative 66 (*)    Lymphs Abs 4.9 (*)    All other components within normal limits  COMPREHENSIVE METABOLIC PANEL - Abnormal; Notable for the following:    Glucose, Bld 146 (*)    Total Bilirubin 0.2 (*)    All other components within normal limits   LIPASE, BLOOD - Abnormal; Notable for the following:    Lipase 961 (*)    All other components within normal limits  POCT I-STAT TROPONIN I   Imaging Review No results found.  EKG Interpretation   None       MDM  No diagnosis found. Pancreatitis acute will need admit    Tyrus Wilms K Thad Osoria-Rasch, MD 05/03/13 2130

## 2013-05-03 NOTE — H&P (Signed)
   Alexander Schmitt, is a 62 y.o. male, DOB - 01/08/1951, XBJ:[id number]  Who was admitted few hours ago for alcoholic pancreatitis, has history of pseudocyst in the past, alcoholic cirrhosis  He claims his last drink was over 2 weeks ago, he has been admitted with bowel rest, supportive care with IV fluids and pain control, he clinically appears stable, a CT scan of abdomen pelvis to evaluate his pseudocyst was ordered upon admission which is pending, I have added a lipid panel, counseled him to quit alcohol for good, continue folic acid and thiamine supplementation. No risk of DTs as patient claims last drink was over 2 weeks ago.   If CBD is adequately evaluated on CT scan no need for ultrasound. Will need outpatient GI followup post discharge for his history of pseudocyst and alcoholic cirrhosis.     Filed Vitals:   05/03/13 0045 05/03/13 0145 05/03/13 0230 05/03/13 0259  BP: 162/74 135/81 106/63 128/72  Pulse: 75 69 67 68  Temp:   98.4 F (36.9 C) 98.4 F (36.9 C)  TempSrc:   Oral Oral  Resp:   17   Height:    6' (1.829 m)  Weight:    71.5 kg (157 lb 10.1 oz)  SpO2: 98% 97% 94% 94%        Data Review   Micro Results No results found for this or any previous visit (from the past 240 hour(s)).  Radiology Reports No results found.  CBC  Recent Labs Lab 05/02/13 1840 05/03/13 0355  WBC 7.3 13.0*  HGB 14.8 13.4  HCT 44.2 40.1  PLT 336 342  MCV 95.1 93.9  MCH 31.8 31.4  MCHC 33.5 33.4  RDW 13.0 13.1  LYMPHSABS 4.9*  --   MONOABS 0.4  --   EOSABS 0.2  --   BASOSABS 0.0  --     Chemistries   Recent Labs Lab 05/02/13 1840 05/03/13 0355  NA 139 140  K 3.8 4.9  CL 102 104  CO2 28 26  GLUCOSE 146* 108*  BUN 7 7  CREATININE 0.84 0.76  CALCIUM 8.9 8.5  MG  --  2.1  AST 28 20  ALT 21 17  ALKPHOS 87 76  BILITOT 0.2* 0.2*   ------------------------------------------------------------------------------------------------------------------ estimated  creatinine clearance is 98.1 ml/min (by C-G formula based on Cr of 0.76). ------------------------------------------------------------------------------------------------------------------ No results found for this basename: HGBA1C,  in the last 72 hours ------------------------------------------------------------------------------------------------------------------ No results found for this basename: CHOL, HDL, LDLCALC, TRIG, CHOLHDL, LDLDIRECT,  in the last 72 hours ------------------------------------------------------------------------------------------------------------------ No results found for this basename: TSH, T4TOTAL, FREET3, T3FREE, THYROIDAB,  in the last 72 hours ------------------------------------------------------------------------------------------------------------------ No results found for this basename: VITAMINB12, FOLATE, FERRITIN, TIBC, IRON, RETICCTPCT,  in the last 72 hours  Coagulation profile No results found for this basename: INR, PROTIME,  in the last 168 hours  No results found for this basename: DDIMER,  in the last 72 hours  Cardiac Enzymes No results found for this basename: CK, CKMB, TROPONINI, MYOGLOBIN,  in the last 168 hours ------------------------------------------------------------------------------------------------------------------ No components found with this basename: POCBNP,

## 2013-05-04 DIAGNOSIS — K863 Pseudocyst of pancreas: Secondary | ICD-10-CM | POA: Diagnosis present

## 2013-05-04 DIAGNOSIS — K862 Cyst of pancreas: Secondary | ICD-10-CM

## 2013-05-04 DIAGNOSIS — E440 Moderate protein-calorie malnutrition: Secondary | ICD-10-CM

## 2013-05-04 LAB — COMPREHENSIVE METABOLIC PANEL
ALT: 12 U/L (ref 0–53)
AST: 14 U/L (ref 0–37)
Albumin: 3 g/dL — ABNORMAL LOW (ref 3.5–5.2)
Alkaline Phosphatase: 72 U/L (ref 39–117)
BUN: 6 mg/dL (ref 6–23)
GFR calc non Af Amer: >90 mL/min (ref >90)
Potassium: 3.9 mEq/L (ref 3.5–5.1)
Sodium: 139 mEq/L (ref 135–145)
Total Protein: 5.9 g/dL — ABNORMAL LOW (ref 6.0–8.3)

## 2013-05-04 LAB — LIPASE, BLOOD: Lipase: 223 U/L — ABNORMAL HIGH (ref 11–59)

## 2013-05-04 LAB — CBC
HCT: 40.1 % (ref 39.0–52.0)
MCHC: 32.4 g/dL (ref 30.0–36.0)
Platelets: 307 10*3/uL (ref 150–400)
RDW: 13.2 % (ref 11.5–15.5)

## 2013-05-04 MED ORDER — PANCRELIPASE (LIP-PROT-AMYL) 12000-38000 UNITS PO CPEP
1.0000 | ORAL_CAPSULE | Freq: Three times a day (TID) | ORAL | Status: DC
  Administered 2013-05-04 – 2013-05-05 (×3): 1 via ORAL
  Filled 2013-05-04 (×5): qty 1

## 2013-05-04 NOTE — Progress Notes (Signed)
[facility] PROGRESS NOTE  ELZIA HOTT ZOX:[id number] DOB: 05/01/1951 DOA: 05/02/2013 PCP: Callie Fielding, MD   Brief narrative  62 year old male with history of alcoholic pancreatitis, cirrhosis admitted with acute on chronic pancreatitis. CT scan of the abdomen showing increase in size of the pseudocyst.  Assessment/Plan: Acute on chronic pancreatitis  no abdominal pain today. Will start on clear liquids and advance as tolerated. Lipase trending down.  Pain control as needed Will start pancreatitic enzymes.    Pancreatic pseudocyst Noted for increase in size of pancreatic pseudocyst of 3.9 cm from previous CT scan. Also has cirrhosis of liver. Will need GI follow up as outpatient. consoled on etoh cessation. Patient still drinks actively. Also counseled on smoking cessation.  etoh abuse  no signs of withdrawal. continue thiamine and folate   Code Status: Full code Family Communication: wife at  Bedside Disposition Plan: home once improved    Consultants:  none  Procedures:  none  Antibiotics:  none  HPI/Subjective: abdominal pain improved. No nausea or vomiting  Objective: Filed Vitals:   05/04/13 1451  BP: 119/68  Pulse: 79  Temp: 98.6 F (37 C)  Resp:     Intake/Output Summary (Last 24 hours) at 05/04/13 1459 Last data filed at 05/04/13 1306  Gross per 24 hour  Intake    480 ml  Output    250 ml  Net    230 ml   Filed Weights   05/02/13 1823 05/03/13 0259  Weight: 72.576 kg (160 lb) 71.5 kg (157 lb 10.1 oz)    Exam:   General:  Elderly male in no acute distress  HEENT: No pallor, moist oral mucosa, no icterus  Chest: Clear to auscultation bilaterally, no added sounds  CVS: Normal S1 and S2, no murmurs rub or gallop  Abdomen: Soft, nontender, nondistended, bowel sounds present  Extremities: Warm, no edema  CNS: AAO x3   Data Reviewed: Basic Metabolic Panel:  Recent Labs Lab 05/02/13 1840 05/03/13 0355  05/04/13 0404  NA 139 140 139  K 3.8 4.9 3.9  CL 102 104 104  CO2 28 26 25   GLUCOSE 146* 108* 78  BUN 7 7 6   CREATININE 0.84 0.76 0.82  CALCIUM 8.9 8.5 8.7  MG  --  2.1  --   PHOS  --  3.3  --    Liver Function Tests:  Recent Labs Lab 05/02/13 1840 05/03/13 0355 05/04/13 0404  AST 28 20 14   ALT 21 17 12   ALKPHOS 87 76 72  BILITOT 0.2* 0.2* 0.5  PROT 6.9 6.2 5.9*  ALBUMIN 3.6 3.3* 3.0*    Recent Labs Lab 05/02/13 1840 05/04/13 0404  LIPASE 961* 223*   No results found for this basename: AMMONIA,  in the last 168 hours CBC:  Recent Labs Lab 05/02/13 1840 05/03/13 0355 05/04/13 0404  WBC 7.3 13.0* 7.6  NEUTROABS 1.8  --   --   HGB 14.8 13.4 13.0  HCT 44.2 40.1 40.1  MCV 95.1 93.9 95.0  PLT 336 342 307   Cardiac Enzymes: No results found for this basename: CKTOTAL, CKMB, CKMBINDEX, TROPONINI,  in the last 168 hours BNP (last 3 results) No results found for this basename: PROBNP,  in the last 8760 hours CBG: No results found for this basename: GLUCAP,  in the last 168 hours  No results found for this or any previous visit (from the past 240 hour(s)).   Studies: Ct Abdomen Pelvis W Contrast  05/03/2013   CLINICAL DATA:  Abdominal pain, cirrhosis, history of pseudocyst  EXAM: CT ABDOMEN AND PELVIS WITH CONTRAST  TECHNIQUE: Multidetector CT imaging of the abdomen and pelvis was performed using the standard protocol following bolus administration of intravenous contrast.  CONTRAST:  OMNIPAQUE IOHEXOL 300 MG/ML  SOLN  COMPARISON:  07/25/2012  FINDINGS: Mild patchy bilateral lower lobe opacities, favored to reflect atelectasis. Associated trace bilateral pleural effusions, right greater than left.  Mildly nodular hepatic contour, reflecting cirrhosis. Scattered probable hepatic cysts measuring up to 12 mm in the medial segment left hepatic lobe (series 2/image 27).  Spleen and adrenal glands are within normal limits.  Peripancreatic inflammatory changes along  the pancreatic head/uncinate process, reflecting acute pancreatitis. Parenchymal calcifications with irregular dilatation of the main pancreatic duct, reflecting sequela of prior/chronic pancreatitis. 3.2 x 3.9 cm probable pseudocyst in the pancreatic head (series 2/ image 39), previously 12 mm.  Gallbladder is underdistended. No intrahepatic or extrahepatic ductal dilatation.  2.2 cm right lower pole renal cyst (series 6/ image 26). Left kidney is within normal limits. No hydronephrosis.  No evidence of bowel obstruction.  Mild atherosclerotic calcifications of the abdominal aorta. Portal vein remains patent. Portosplenic confluence/SMV is narrowed/extrinsically compressed by the adjacent pseudocyst (series 2/ image 37) but remains patent.  Small volume abdominopelvic ascites.  Small upper abdominal/ retroperitoneal lymph nodes, likely reactive.  Prostatomegaly.  Bladder is underdistended.  Visualized osseous structures are within normal limits.  IMPRESSION: Acute on chronic pancreatitis.  Suspected 3.9 cm pseudocyst in the pancreatic head, previously 1.2 cm.  Cirrhosis. Portal vein remains patent. Small volume abdominopelvic ascites.   Electronically Signed   By: Charline Bills M.D.   On: 05/03/2013 13:06    Scheduled Meds: . docusate sodium  100 mg Oral BID  . folic acid  1 mg Oral Daily  . senna  1 tablet Oral BID  . sodium chloride  3 mL Intravenous Q12H  . thiamine  100 mg Oral Daily   Continuous Infusions: . sodium chloride 100 mL/hr at 05/03/13 1830      Time spent:25 minutes    Matthan Sledge  [facility] Pager [phone] If 7PM-7AM, please contact night-coverage at [email or web address], password [facility] 05/04/2013, 2:59 PM  LOS: 2 days

## 2013-05-05 LAB — COMPREHENSIVE METABOLIC PANEL
ALT: 10 U/L (ref 0–53)
AST: 13 U/L (ref 0–37)
BUN: 5 mg/dL — ABNORMAL LOW (ref 6–23)
CO2: 25 mEq/L (ref 19–32)
Chloride: 106 mEq/L (ref 96–112)
Creatinine, Ser: 0.81 mg/dL (ref 0.50–1.35)
GFR calc non Af Amer: >90 mL/min (ref >90)
Glucose, Bld: 84 mg/dL (ref 70–99)
Sodium: 139 mEq/L (ref 135–145)
Total Bilirubin: 0.4 mg/dL (ref 0.3–1.2)

## 2013-05-05 LAB — LIPASE, BLOOD: Lipase: 104 U/L — ABNORMAL HIGH (ref 11–59)

## 2013-05-05 MED ORDER — ENSURE COMPLETE PO LIQD: 237.0000 mL | Freq: Two times a day (BID) | ORAL | Status: DC

## 2013-05-05 MED ORDER — ENSURE COMPLETE PO LIQD
237.0000 mL | Freq: Two times a day (BID) | ORAL | Status: DC
Start: 2013-05-05 — End: 2013-05-05
  Administered 2013-05-05 (×2): 237 mL via ORAL

## 2013-05-05 MED ORDER — HYDROCODONE-ACETAMINOPHEN 5-325 MG PO TABS: 1.0000 | ORAL_TABLET | Freq: Four times a day (QID) | ORAL | Status: DC | PRN

## 2013-05-05 MED ORDER — PANCRELIPASE (LIP-PROT-AMYL) 12000-38000 UNITS PO CPEP: 1.0000 | ORAL_CAPSULE | Freq: Three times a day (TID) | ORAL | Status: DC

## 2013-05-05 NOTE — Discharge Summary (Signed)
Physician Discharge Summary  Alexander Schmitt:[id number] DOB: 1950-08-20 DOA: 05/02/2013  PCP: Callie Fielding, MD  Admit date: 05/02/2013 Discharge date: 05/05/2013  Time spent: 40  minutes  Recommendations for Outpatient Follow-up:  Followup with PCP. Patient reports having appointment in  2 weeks. Followup with [facility] for pancreatic pseudocyst and cirrhosis of liver  Discharge Diagnoses:  Principal Problem:   Acute on chronic Pancreatitis  Active Problems:   Cirrhosis   Alcohol abuse, in remission   Malnutrition of moderate degree   Pancreatic pseudocyst   Discharge Condition: Fair  Diet recommendation: Regular with nutrition supplements  Filed Weights   05/02/13 1823 05/03/13 0259 05/05/13 0844  Weight: 72.576 kg (160 lb) 71.5 kg (157 lb 10.1 oz) 71.5 kg (157 lb 10.1 oz)    History of present illness:  Reason for to admission H&P for details, but in brief,62 year old male with history of alcoholic pancreatitis, cirrhosis admitted with acute on chronic pancreatitis. CT scan of the abdomen showing increase in size of the pseudocyst.   Hospital Course:   Acute on chronic pancreatitis  No further abdominal pain. Patient continues to drink alcohol and strongly counseled on cessation. Also counseled on smoking cessation . Started on clears and diet advance today to regular which he has tolerated. Lipase trending down.  Started on pancreatitic enzymes.    Pancreatic pseudocyst  Noted for increase in size of pancreatic pseudocyst of 3.9 cm from previous CT scan. Also has cirrhosis of liver. Will need GI follow up as outpatient. consoled on etoh cessation.  Appointment scheduled with Dr. Dulce Sellar ([facility]) on November 21 at 10 AM  etoh abuse  no signs of withdrawal.  Counseled on cessation.  malnutrition Seen by a dietitian. Started on nutrition supplements   Code Status: Full code  Family Communication: None at Bedside  Disposition Plan: home    Procedures:  None  Consultations:  None  Discharge Exam: Filed Vitals:   05/05/13 0844  BP: 128/72  Pulse: 84  Temp: 98.4 F (36.9 C)  Resp: 20   General: Elderly male in no acute distress  HEENT: No pallor, moist oral mucosa, no icterus  Chest: Clear to auscultation bilaterally, no added sounds  CVS: Normal S1 and S2, no murmurs rub or gallop  Abdomen: Soft, nontender, nondistended, bowel sounds present Extremities: Warm, no edema  CNS: AAO x3   Discharge Instructions     Medication List         feeding supplement (ENSURE COMPLETE) Liqd  Take 237 mLs by mouth 2 (two) times daily between meals.     HYDROcodone-acetaminophen 5-325 MG per tablet  Commonly known as:  NORCO/VICODIN  Take 1 tablet by mouth every 6 (six) hours as needed.     lipase/protease/amylase 81191 UNITS Cpep capsule  Commonly known as:  CREON-12/PANCREASE  Take 1 capsule by mouth 3 (three) times daily with meals.     magnesium citrate Soln  Take 0.5 Bottles by mouth as needed. For constipation     polyethylene glycol packet  Commonly known as:  MIRALAX / GLYCOLAX  Take 17 g by mouth daily as needed (for constipation).       No Known Allergies     Follow-up Information   Follow up with Callie Fielding, MD In 1 week.   Specialty:  Family Medicine   Contact information:   [address], [address] [place] [address] [phone]       Follow up with Freddy Jaksch, MD On 05/27/2013. (10 am)  Specialty:  Gastroenterology   Contact information:   [address] [place] [address] [phone]        The results of significant diagnostics from this hospitalization (including imaging, microbiology, ancillary and laboratory) are listed below for reference.    Significant Diagnostic Studies: Ct Abdomen Pelvis W Contrast  05/03/2013   CLINICAL DATA:  Abdominal pain, cirrhosis, history of pseudocyst  EXAM: CT ABDOMEN AND PELVIS WITH CONTRAST  TECHNIQUE:  Multidetector CT imaging of the abdomen and pelvis was performed using the standard protocol following bolus administration of intravenous contrast.  CONTRAST:  OMNIPAQUE IOHEXOL 300 MG/ML  SOLN  COMPARISON:  07/25/2012  FINDINGS: Mild patchy bilateral lower lobe opacities, favored to reflect atelectasis. Associated trace bilateral pleural effusions, right greater than left.  Mildly nodular hepatic contour, reflecting cirrhosis. Scattered probable hepatic cysts measuring up to 12 mm in the medial segment left hepatic lobe (series 2/image 27).  Spleen and adrenal glands are within normal limits.  Peripancreatic inflammatory changes along the pancreatic head/uncinate process, reflecting acute pancreatitis. Parenchymal calcifications with irregular dilatation of the main pancreatic duct, reflecting sequela of prior/chronic pancreatitis. 3.2 x 3.9 cm probable pseudocyst in the pancreatic head (series 2/ image 39), previously 12 mm.  Gallbladder is underdistended. No intrahepatic or extrahepatic ductal dilatation.  2.2 cm right lower pole renal cyst (series 6/ image 26). Left kidney is within normal limits. No hydronephrosis.  No evidence of bowel obstruction.  Mild atherosclerotic calcifications of the abdominal aorta. Portal vein remains patent. Portosplenic confluence/SMV is narrowed/extrinsically compressed by the adjacent pseudocyst (series 2/ image 37) but remains patent.  Small volume abdominopelvic ascites.  Small upper abdominal/ retroperitoneal lymph nodes, likely reactive.  Prostatomegaly.  Bladder is underdistended.  Visualized osseous structures are within normal limits.  IMPRESSION: Acute on chronic pancreatitis.  Suspected 3.9 cm pseudocyst in the pancreatic head, previously 1.2 cm.  Cirrhosis. Portal vein remains patent. Small volume abdominopelvic ascites.   Electronically Signed   By: Charline Bills M.D.   On: 05/03/2013 13:06    Microbiology: No results found for this or any previous visit  (from the past 240 hour(s)).   Labs: Basic Metabolic Panel:  Recent Labs Lab 05/02/13 1840 05/03/13 0355 05/04/13 0404 05/05/13 0411  NA 139 140 139 139  K 3.8 4.9 3.9 3.7  CL 102 104 104 106  CO2 28 26 25 25   GLUCOSE 146* 108* 78 84  BUN 7 7 6  5*  CREATININE 0.84 0.76 0.82 0.81  CALCIUM 8.9 8.5 8.7 8.8  MG  --  2.1  --   --   PHOS  --  3.3  --   --    Liver Function Tests:  Recent Labs Lab 05/02/13 1840 05/03/13 0355 05/04/13 0404 05/05/13 0411  AST 28 20 14 13   ALT 21 17 12 10   ALKPHOS 87 76 72 67  BILITOT 0.2* 0.2* 0.5 0.4  PROT 6.9 6.2 5.9* 5.7*  ALBUMIN 3.6 3.3* 3.0* 2.8*    Recent Labs Lab 05/02/13 1840 05/04/13 0404 05/05/13 0411  LIPASE 961* 223* 104*   No results found for this basename: AMMONIA,  in the last 168 hours CBC:  Recent Labs Lab 05/02/13 1840 05/03/13 0355 05/04/13 0404  WBC 7.3 13.0* 7.6  NEUTROABS 1.8  --   --   HGB 14.8 13.4 13.0  HCT 44.2 40.1 40.1  MCV 95.1 93.9 95.0  PLT 336 342 307   Cardiac Enzymes: No results found for this basename: CKTOTAL,  CKMB, CKMBINDEX, TROPONINI,  in the last 168 hours BNP: BNP (last 3 results) No results found for this basename: PROBNP,  in the last 8760 hours CBG: No results found for this basename: GLUCAP,  in the last 168 hours     Signed:  Anzal Bartnick  [facility] 05/05/2013, 11:35 AM

## 2013-06-21 ENCOUNTER — Encounter (HOSPITAL_COMMUNITY): Payer: Self-pay | Admitting: Pharmacy Technician

## 2013-06-23 ENCOUNTER — Encounter (HOSPITAL_COMMUNITY): Payer: Self-pay | Admitting: *Deleted

## 2013-06-23 NOTE — Progress Notes (Signed)
05/27/2013-Labs from Dr. Dulce Sellar on chart-Ferritin,Iron [facility] Direct,ASMA/AMA Panel,HCV Antibody,HBsAB on chart.

## 2013-07-12 ENCOUNTER — Other Ambulatory Visit: Payer: Self-pay | Admitting: Gastroenterology

## 2013-07-13 ENCOUNTER — Encounter (HOSPITAL_COMMUNITY)
Admission: RE | Disposition: A | Discharge status: Home / Self Care | Payer: Self-pay | Source: Ambulatory Visit | Attending: Gastroenterology

## 2013-07-13 ENCOUNTER — Encounter (HOSPITAL_COMMUNITY): Payer: Self-pay | Admitting: *Deleted

## 2013-07-13 ENCOUNTER — Ambulatory Visit (HOSPITAL_COMMUNITY)
Admission: RE | Admit: 2013-07-13 | Discharge: 2013-07-13 | Disposition: A | Discharge status: Home / Self Care | Payer: Medicaid Other | Source: Ambulatory Visit | Attending: Gastroenterology | Admitting: Gastroenterology

## 2013-07-13 ENCOUNTER — Ambulatory Visit (HOSPITAL_COMMUNITY): Payer: Medicaid Other | Admitting: Anesthesiology

## 2013-07-13 ENCOUNTER — Encounter (HOSPITAL_COMMUNITY): Payer: Medicaid Other | Admitting: Anesthesiology

## 2013-07-13 DIAGNOSIS — K862 Cyst of pancreas: Secondary | ICD-10-CM | POA: Insufficient documentation

## 2013-07-13 DIAGNOSIS — K863 Pseudocyst of pancreas: Principal | ICD-10-CM

## 2013-07-13 DIAGNOSIS — F17200 Nicotine dependence, unspecified, uncomplicated: Secondary | ICD-10-CM | POA: Insufficient documentation

## 2013-07-13 DIAGNOSIS — K861 Other chronic pancreatitis: Secondary | ICD-10-CM | POA: Insufficient documentation

## 2013-07-13 DIAGNOSIS — K219 Gastro-esophageal reflux disease without esophagitis: Secondary | ICD-10-CM | POA: Insufficient documentation

## 2013-07-13 HISTORY — PX: EUS: SHX5427

## 2013-07-13 SURGERY — ESOPHAGEAL ENDOSCOPIC ULTRASOUND (EUS) RADIAL
Anesthesia: Monitor Anesthesia Care

## 2013-07-13 MED ORDER — BUTAMBEN-TETRACAINE-BENZOCAINE 2-2-14 % EX AERO
INHALATION_SPRAY | CUTANEOUS | Status: DC | PRN
  Administered 2013-07-13: 2 via TOPICAL

## 2013-07-13 MED ORDER — MIDAZOLAM HCL 5 MG/5ML IJ SOLN
INTRAMUSCULAR | Status: DC | PRN
  Administered 2013-07-13: 2 mg via INTRAVENOUS

## 2013-07-13 MED ORDER — MIDAZOLAM HCL 2 MG/2ML IJ SOLN
INTRAMUSCULAR | Status: AC
  Filled 2013-07-13: qty 2

## 2013-07-13 MED ORDER — LACTATED RINGERS IV SOLN
INTRAVENOUS | Status: DC | PRN
  Administered 2013-07-13: 08:00:00 via INTRAVENOUS

## 2013-07-13 MED ORDER — PROPOFOL 10 MG/ML IV BOLUS
INTRAVENOUS | Status: AC
  Filled 2013-07-13: qty 20

## 2013-07-13 MED ORDER — PROPOFOL INFUSION 10 MG/ML OPTIME
INTRAVENOUS | Status: DC | PRN
  Administered 2013-07-13: 100 ug/kg/min via INTRAVENOUS

## 2013-07-13 MED ORDER — LACTATED RINGERS IV SOLN
INTRAVENOUS | Status: DC
  Administered 2013-07-13: 08:00:00 via INTRAVENOUS

## 2013-07-13 MED ORDER — PROPOFOL 10 MG/ML IV EMUL
INTRAVENOUS | Status: DC | PRN
  Administered 2013-07-13: 40 mg via INTRAVENOUS

## 2013-07-13 MED ORDER — LIDOCAINE HCL (CARDIAC) 20 MG/ML IV SOLN
INTRAVENOUS | Status: AC
  Filled 2013-07-13: qty 5

## 2013-07-13 MED ORDER — SODIUM CHLORIDE 0.9 % IV SOLN
INTRAVENOUS | Status: DC
Start: 2013-07-13 — End: 2013-07-13

## 2013-07-13 NOTE — Anesthesia Preprocedure Evaluation (Addendum)
Anesthesia Evaluation  Patient identified by MRN, date of birth, ID band Patient awake    Reviewed: Allergy & Precautions, H&P , NPO status , Patient's Chart, lab work & pertinent test results  Airway Mallampati: II TM Distance: >3 FB Neck ROM: Full    Dental no notable dental hx.    Pulmonary Current Smoker,  breath sounds clear to auscultation  Pulmonary exam normal       Cardiovascular negative cardio ROS  Rhythm:Regular Rate:Normal     Neuro/Psych negative neurological ROS  negative psych ROS   GI/Hepatic GERD-  ,(+) Cirrhosis -       ,   Endo/Other  negative endocrine ROS  Renal/GU negative Renal ROS  negative genitourinary   Musculoskeletal negative musculoskeletal ROS (+)   Abdominal   Peds negative pediatric ROS (+)  Hematology negative hematology ROS (+)   Anesthesia Other Findings   Reproductive/Obstetrics negative OB ROS                          Anesthesia Physical Anesthesia Plan  ASA: III  Anesthesia Plan: MAC   Post-op Pain Management:    Induction: Intravenous  Airway Management Planned:   Additional Equipment:   Intra-op Plan:   Post-operative Plan:   Informed Consent: I have reviewed the patients History and Physical, chart, labs and discussed the procedure including the risks, benefits and alternatives for the proposed anesthesia with the patient or authorized representative who has indicated his/her understanding and acceptance.   Dental advisory given  Plan Discussed with: CRNA  Anesthesia Plan Comments:         Anesthesia Quick Evaluation

## 2013-07-13 NOTE — Preoperative (Signed)
Beta Blockers   Reason not to administer Beta Blockers:Not Applicable 

## 2013-07-13 NOTE — Anesthesia Postprocedure Evaluation (Signed)
  Anesthesia Post-op Note  Patient: Alexander Schmitt  Procedure(s) Performed: Procedure(s) (LRB): ESOPHAGEAL ENDOSCOPIC ULTRASOUND (EUS) RADIAL (N/A)  Patient Location: PACU  Anesthesia Type: MAC  Level of Consciousness: awake and alert   Airway and Oxygen Therapy: Patient Spontanous Breathing  Post-op Pain: mild  Post-op Assessment: Post-op Vital signs reviewed, Patient's Cardiovascular Status Stable, Respiratory Function Stable, Patent Airway and No signs of Nausea or vomiting  Last Vitals:  Filed Vitals:   07/13/13 1204  BP: 123/52  Pulse:   Temp:   Resp: 14    Post-op Vital Signs: stable   Complications: No apparent anesthesia complications

## 2013-07-13 NOTE — Discharge Instructions (Signed)
Endoscopic Ultrasound (EUS)  Care After  Please read the instructions outlined below and refer to this sheet in the next few weeks. These discharge instructions provide you with general information on caring for yourself after you leave the hospital. Your doctor may also give you specific instructions. While your treatment has been planned according to the most current medical practices available, unavoidable complications occasionally occur. If you have any problems or questions after discharge, please call Dr. Paulita Fujita [facility]) at [phone].  HOME CARE INSTRUCTIONS  Activity  You may resume your regular activity but move at a slower pace for the next 24 hours.   Take frequent rest periods for the next 24 hours.   Walking will help expel (get rid of) the air and reduce the bloated feeling in your abdomen.   No driving for 24 hours (because of the anesthesia (medicine) used during the test).   You may shower.   Do not sign any important legal documents or operate any machinery for 24 hours (because of the anesthesia used during the test).  Nutrition  Drink plenty of fluids.   You may resume your normal diet.   Begin with a light meal and progress to your normal diet.   Avoid alcoholic beverages for 24 hours or as instructed by your caregiver.   Medications You may resume your normal medications unless your caregiver tells you otherwise.  What you can expect today  You may experience abdominal discomfort such as a feeling of fullness or "gas" pains.   You may experience a sore throat for 2 to 3 days. This is normal. Gargling with salt water may help this.   SEEK IMMEDIATE MEDICAL CARE IF:  You have excessive nausea (feeling sick to your stomach) and/or vomiting.   You have severe abdominal pain and distention (swelling).   You have trouble swallowing.   You have a temperature over 100 F (37.8 C).   You have rectal bleeding or vomiting of blood.

## 2013-07-13 NOTE — Transfer of Care (Signed)
Immediate Anesthesia Transfer of Care Note  Patient: Alexander Schmitt  Procedure(s) Performed: Procedure(s): ESOPHAGEAL ENDOSCOPIC ULTRASOUND (EUS) RADIAL (N/A)  Patient Location: Endoscopy Unit  Anesthesia Type:MAC  Level of Consciousness: awake and alert   Airway & Oxygen Therapy: Patient Spontanous Breathing and Patient connected to face mask oxygen  Post-op Assessment: Report given to PACU RN and Post -op Vital signs reviewed and stable  Post vital signs: Reviewed and stable  Complications: No apparent anesthesia complications

## 2013-07-13 NOTE — Addendum Note (Signed)
Addended by: Jahkeem Kurka on: 07/13/2013 08:00 AM   Modules accepted: Orders  

## 2013-07-13 NOTE — H&P (Signed)
Patient interval history reviewed.  Patient examined again.  There has been no change from documented H/P dated 07/13/03 (scanned into chart from our office) except as documented above.  Assessment:  1.  Pancreatic cyst.  Plan:  1.  Endoscopic ultrasound with possible cyst aspiration. 2.  Risks (bleeding, infection, bowel perforation that could require surgery, sedation-related changes in cardiopulmonary systems), benefits (identification and possible treatment of source of symptoms, exclusion of certain causes of symptoms), and alternatives (watchful waiting, radiographic imaging studies, empiric medical treatment) of upper endoscopy with ultrasound and possible cyst aspiration (EUS +/- FNA) were explained to patient/family in detail and patient wishes to proceed.

## 2013-07-13 NOTE — Op Note (Signed)
[place], [id number]   ENDOSCOPIC ULTRASOUND PROCEDURE REPORT  PATIENT: Alexander Schmitt, Alexander Schmitt  MR#: [id number] BIRTHDATE: 30-Mar-1951  GENDER: Male ENDOSCOPIST: Arta Silence, MD REFERRED BY:  Georgiana Spinner, MD PROCEDURE DATE:  07/13/2013 PROCEDURE:   Upper endoscopic ultrasound (EUS) ASA CLASS:      ASA-III INDICATIONS:   pancreatitis, pancreatic cyst MEDICATIONS: MAC administered by CRNA, cetacaine spray x 2  DESCRIPTION OF PROCEDURE:   After the risks benefits and alternatives of the procedure were  explained, informed consent was obtained. The patient was then placed in the left, lateral, decubitus postion and IV sedation was administered. Throughout the procedure, the patients blood pressure, pulse and oxygen saturations were monitored continuously.  Under direct visualization, the forward-viewing radial  echoendoscope was introduced through the mouth  and advanced to the second portion of the duodenum .  Water was used as necessary to provide an acoustic interface.  Upon completion of the imaging, water was removed and the patient was sent to the recovery room in satisfactory condition.    FINDINGS:      Extensive pancreatic parenchymal and intraductal pancreatic stones.  No obvious pancreatic mass was seen, though the stones throughout the pancreas caused great deal of shadowing which can obscure views of pancreatic lesions.  Multiple pancreatic duct side branches.  Bile duct wall mildly symmetrically thickened, gallbladder wall mildly thickened, and a couple peripancreatic benign-appearing lymph nodes were seen, all likely reactive changes.  Bile duct and gallbladder otherwise normal.  At interface of head and uncinate pancreas, small 17 x 48mm cyst was seen, decrease in size ( 4cm) seen on CT in October.  IMPRESSION:     As above.  Decrease in size of pancreatic cyst.  In setting of this finding and alcohol abstinence, cyst is  highly likely representative of pseudocyst.  Extensive changes of chronic pancreatitis.  RECOMMENDATIONS:     1.  Watch for potential complications of procedure. 2.  Lifelong alcohol abstinence. 3.  No further evaluation into pancreatic cyst is necessary at the present time. 4.  Follow-up with Dr. Paulita Fujita for ongoing management of alcohol-mediated cirrhosis.   _______________________________ Lorrin MaisArta Silence, MD 07/13/2013 11:32 AM   CC:

## 2013-07-14 ENCOUNTER — Encounter (HOSPITAL_COMMUNITY): Payer: Self-pay | Admitting: Gastroenterology

## 2013-12-19 IMAGING — CR DG CHEST 2V
2 series · 2 of 2 positions shown · non-contrast
Comparison: 01/04/2012

CLINICAL DATA: Shortness of breath, weakness

CHEST - 2 VIEW

[w chest pa]
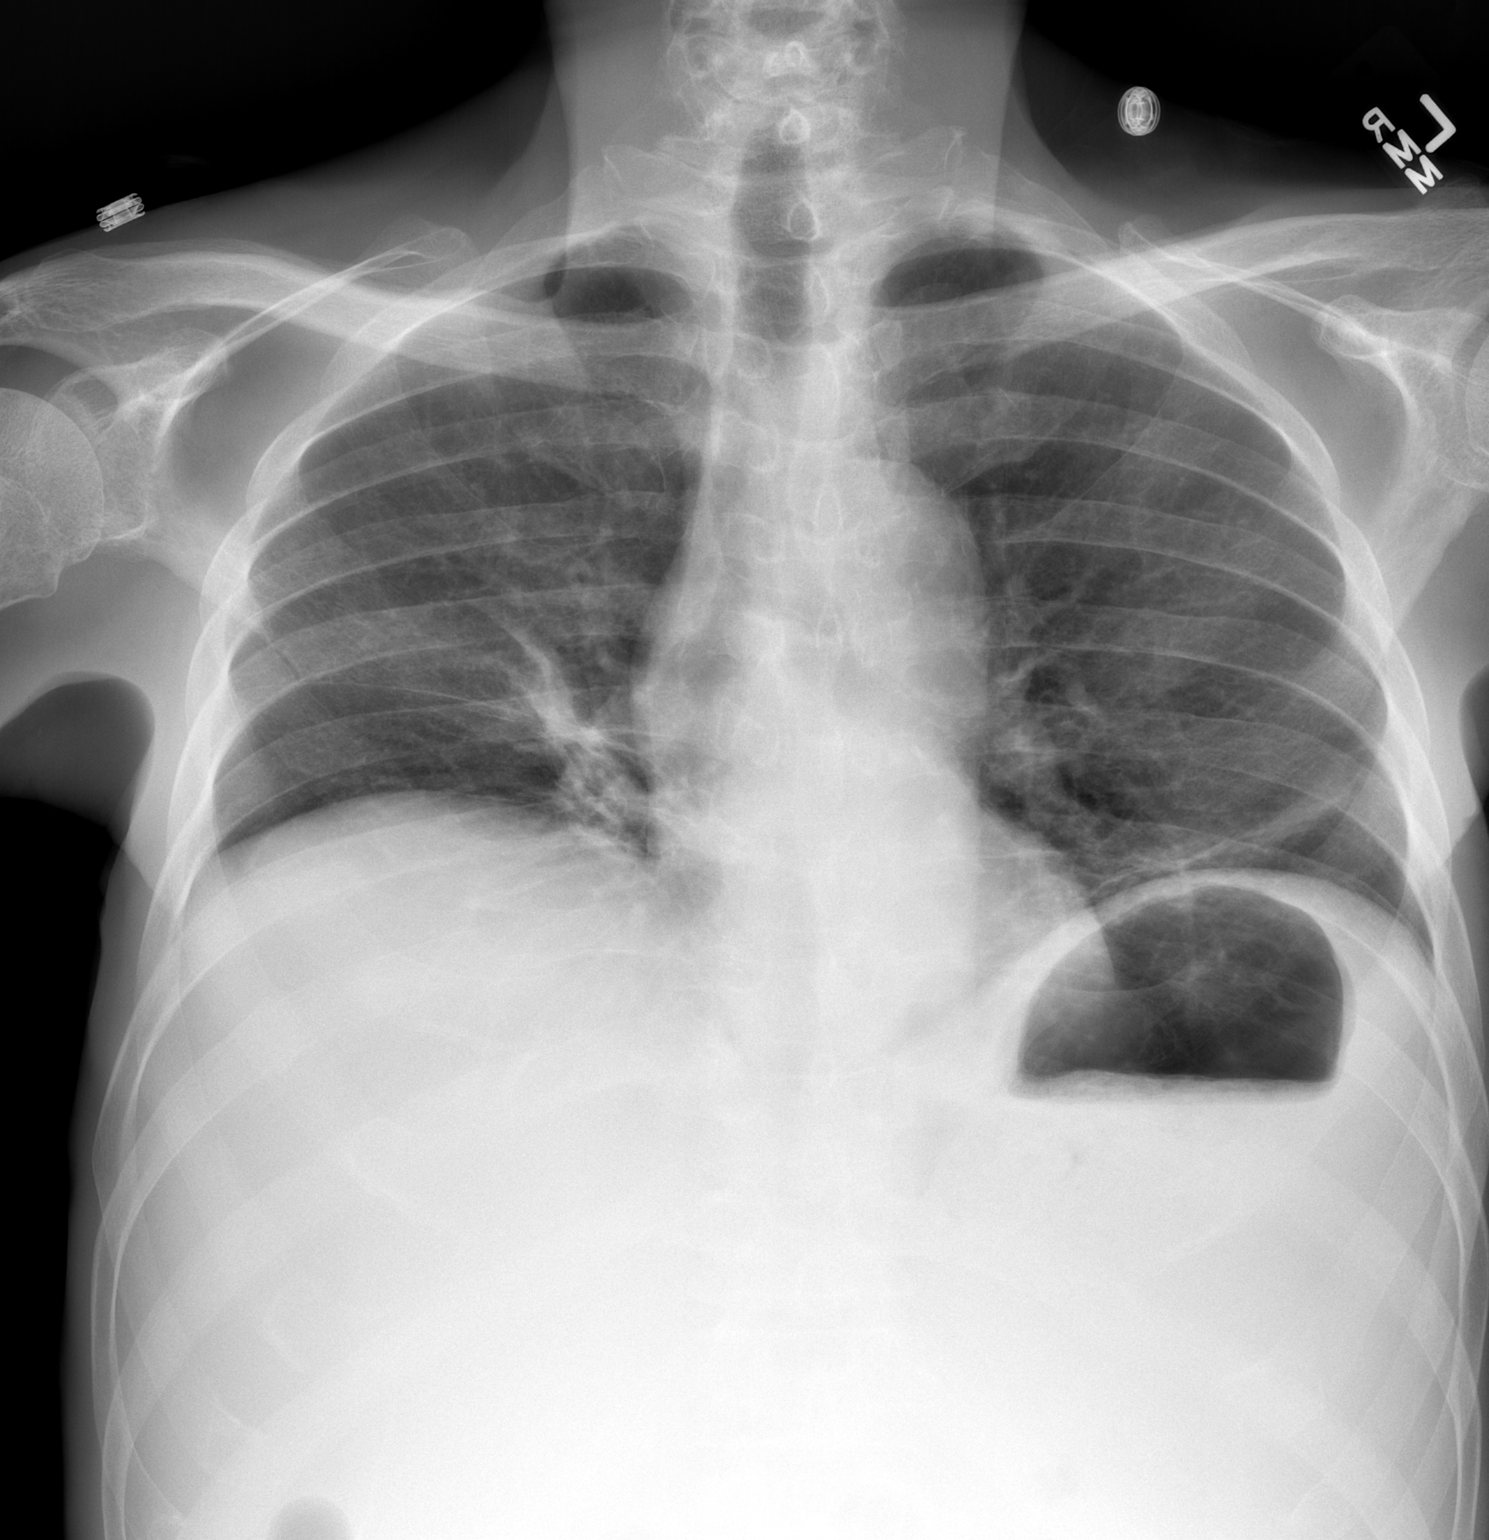

[w chest lat]
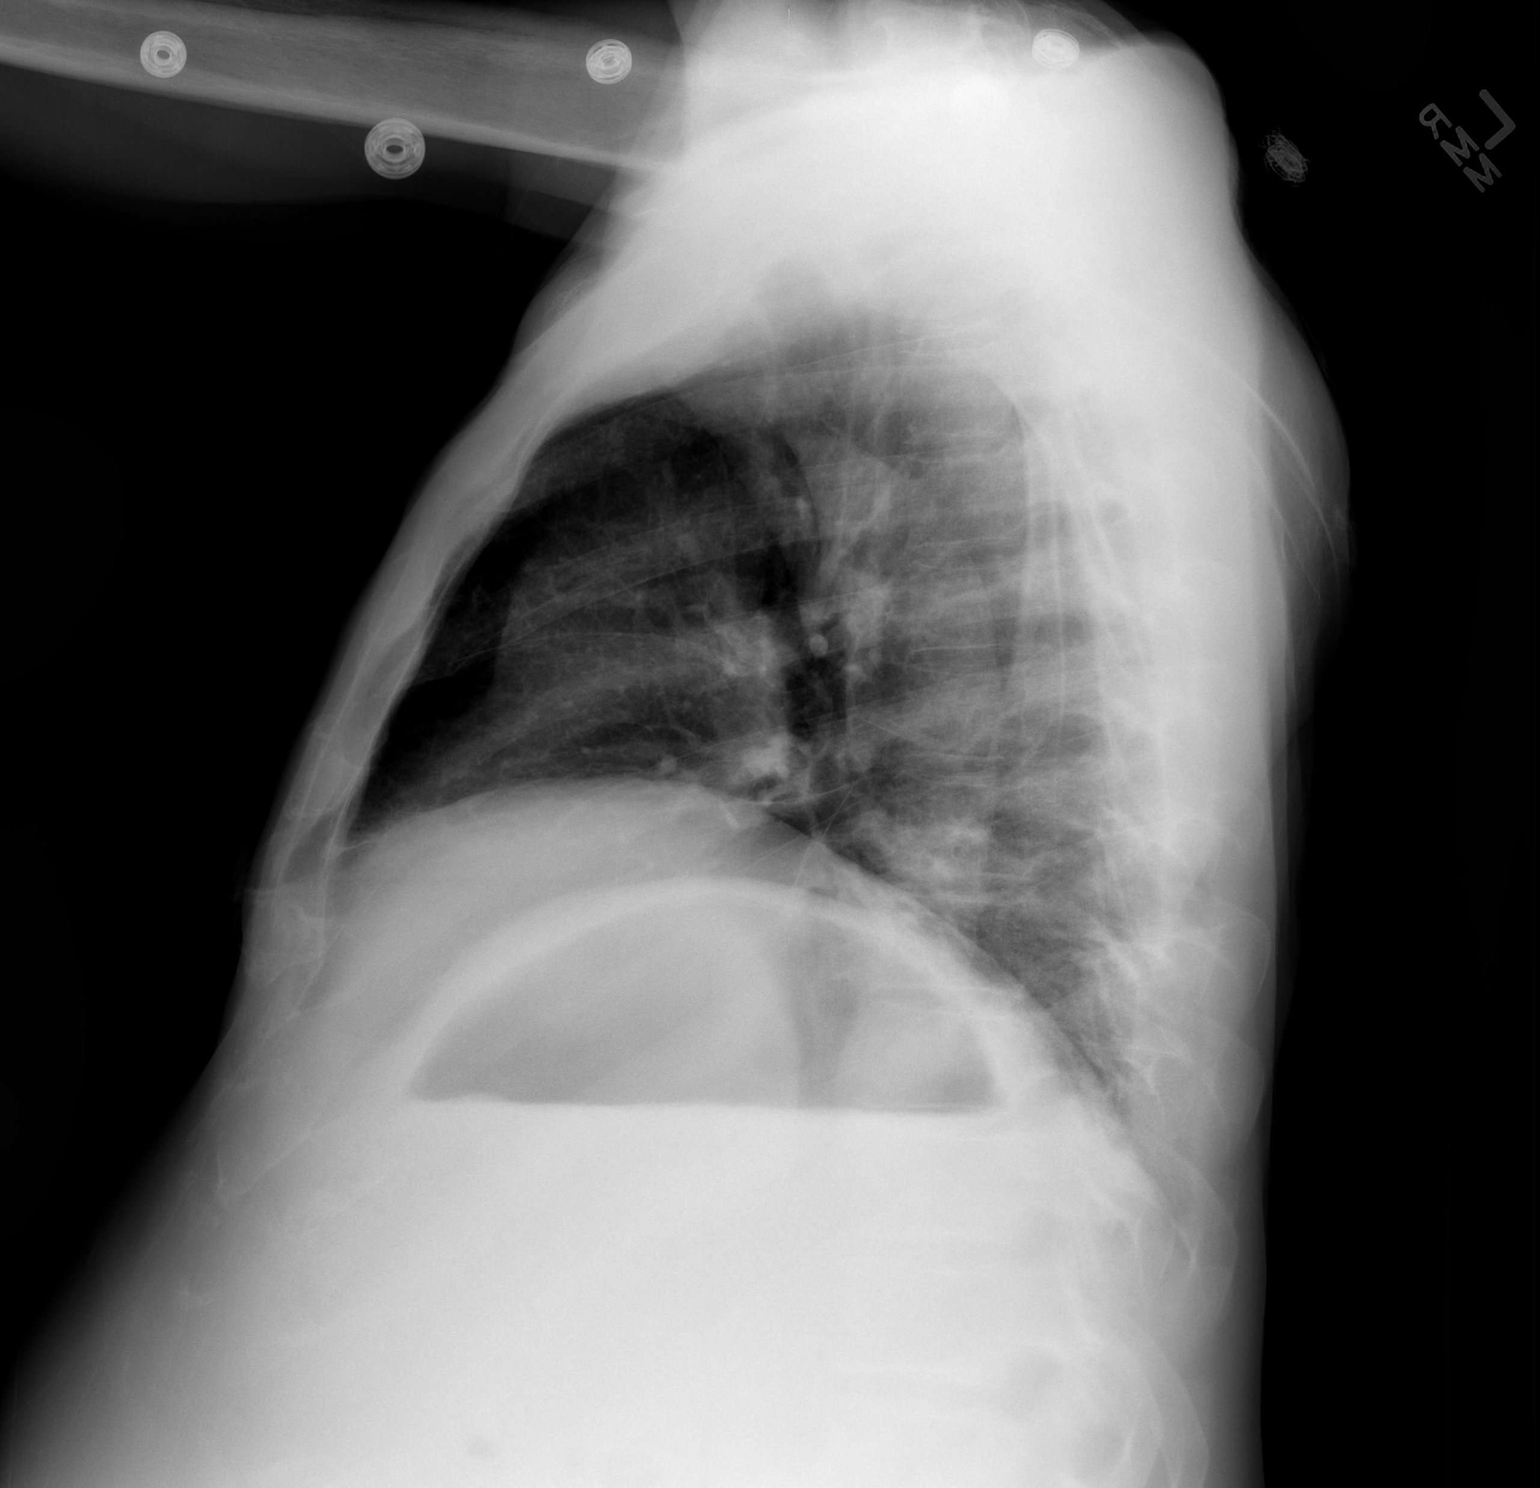

[2 of 2 positions shown; findings below may reference images not displayed]

FINDINGS: Normal heart size, mediastinal contours, and pulmonary vascularity.
Low lung volumes with bibasilar atelectasis.
No infiltrate, pleural effusion or pneumothorax.
No acute osseous findings.
IMPRESSION: Bibasilar atelectasis.

## 2014-02-03 ENCOUNTER — Encounter (HOSPITAL_COMMUNITY): Payer: Self-pay | Admitting: Emergency Medicine

## 2014-02-03 ENCOUNTER — Inpatient Hospital Stay (HOSPITAL_COMMUNITY)
Admission: EM | Admit: 2014-02-03 | Discharge: 2014-02-06 | DRG: 438 | Disposition: A | Discharge status: Home / Self Care | Payer: Medicaid Other | Attending: Internal Medicine | Admitting: Internal Medicine

## 2014-02-03 DIAGNOSIS — K7030 Alcoholic cirrhosis of liver without ascites: Secondary | ICD-10-CM | POA: Diagnosis present

## 2014-02-03 DIAGNOSIS — F1020 Alcohol dependence, uncomplicated: Secondary | ICD-10-CM | POA: Diagnosis present

## 2014-02-03 DIAGNOSIS — M129 Arthropathy, unspecified: Secondary | ICD-10-CM | POA: Diagnosis present

## 2014-02-03 DIAGNOSIS — IMO0002 Reserved for concepts with insufficient information to code with codable children: Secondary | ICD-10-CM

## 2014-02-03 DIAGNOSIS — K852 Alcohol induced acute pancreatitis without necrosis or infection: Secondary | ICD-10-CM | POA: Diagnosis present

## 2014-02-03 DIAGNOSIS — F1011 Alcohol abuse, in remission: Secondary | ICD-10-CM

## 2014-02-03 DIAGNOSIS — K859 Acute pancreatitis without necrosis or infection, unspecified: Secondary | ICD-10-CM | POA: Diagnosis not present

## 2014-02-03 DIAGNOSIS — E43 Unspecified severe protein-calorie malnutrition: Secondary | ICD-10-CM | POA: Diagnosis present

## 2014-02-03 DIAGNOSIS — K862 Cyst of pancreas: Secondary | ICD-10-CM | POA: Diagnosis present

## 2014-02-03 DIAGNOSIS — K863 Pseudocyst of pancreas: Secondary | ICD-10-CM | POA: Diagnosis present

## 2014-02-03 DIAGNOSIS — Z720 Tobacco use: Secondary | ICD-10-CM

## 2014-02-03 DIAGNOSIS — E440 Moderate protein-calorie malnutrition: Secondary | ICD-10-CM | POA: Diagnosis present

## 2014-02-03 DIAGNOSIS — K4090 Unilateral inguinal hernia, without obstruction or gangrene, not specified as recurrent: Secondary | ICD-10-CM

## 2014-02-03 DIAGNOSIS — K219 Gastro-esophageal reflux disease without esophagitis: Secondary | ICD-10-CM | POA: Diagnosis present

## 2014-02-03 DIAGNOSIS — K861 Other chronic pancreatitis: Secondary | ICD-10-CM | POA: Diagnosis present

## 2014-02-03 DIAGNOSIS — F17200 Nicotine dependence, unspecified, uncomplicated: Secondary | ICD-10-CM | POA: Diagnosis present

## 2014-02-03 DIAGNOSIS — K7460 Unspecified cirrhosis of liver: Secondary | ICD-10-CM | POA: Diagnosis present

## 2014-02-03 DIAGNOSIS — R188 Other ascites: Secondary | ICD-10-CM

## 2014-02-03 LAB — RAPID URINE DRUG SCREEN, HOSP PERFORMED
Amphetamines: NOT DETECTED
Barbiturates: NOT DETECTED
Benzodiazepines: NOT DETECTED
Cocaine: NOT DETECTED
Opiates: NOT DETECTED
Tetrahydrocannabinol: NOT DETECTED

## 2014-02-03 LAB — CBC WITH DIFFERENTIAL/PLATELET
Basophils Absolute: 0 10*3/uL (ref 0.0–0.1)
Basophils Relative: 0 % (ref 0–1)
Eosinophils Absolute: 0.1 10*3/uL (ref 0.0–0.7)
Eosinophils Relative: 1 % (ref 0–5)
HCT: 44.5 % (ref 39.0–52.0)
HEMOGLOBIN: 14.7 g/dL (ref 13.0–17.0)
LYMPHS ABS: 2.7 10*3/uL (ref 0.7–4.0)
Lymphocytes Relative: 28 % (ref 12–46)
MCH: 31.9 pg (ref 26.0–34.0)
MCHC: 33 g/dL (ref 30.0–36.0)
MCV: 96.5 fL (ref 78.0–100.0)
Monocytes Absolute: 0.7 10*3/uL (ref 0.1–1.0)
Monocytes Relative: 7 % (ref 3–12)
Neutro Abs: 6.4 10*3/uL (ref 1.7–7.7)
Neutrophils Relative %: 64 % (ref 43–77)
PLATELETS: 292 10*3/uL (ref 150–400)
RBC: 4.61 MIL/uL (ref 4.22–5.81)
RDW: 14.8 % (ref 11.5–15.5)
WBC: 9.9 10*3/uL (ref 4.0–10.5)

## 2014-02-03 LAB — URINE MICROSCOPIC-ADD ON

## 2014-02-03 LAB — URINALYSIS, ROUTINE W REFLEX MICROSCOPIC
Glucose, UA: 100 mg/dL — AB
Hgb urine dipstick: NEGATIVE
Ketones, ur: 40 mg/dL — AB
Nitrite: POSITIVE — AB
Protein, ur: 100 mg/dL — AB
Specific Gravity, Urine: 1.043 — ABNORMAL HIGH (ref 1.005–1.030)
Urobilinogen, UA: 1 mg/dL (ref 0.0–1.0)
pH: 6.5 (ref 5.0–8.0)

## 2014-02-03 LAB — ETHANOL: Alcohol, Ethyl (B): <11 mg/dL (ref 0–11)

## 2014-02-03 LAB — COMPREHENSIVE METABOLIC PANEL
ALK PHOS: 124 U/L — AB (ref 39–117)
ALT: 9 U/L (ref 0–53)
AST: 15 U/L (ref 0–37)
Albumin: 4 g/dL (ref 3.5–5.2)
Anion gap: 15 (ref 5–15)
BUN: 8 mg/dL (ref 6–23)
CO2: 26 meq/L (ref 19–32)
Calcium: 9.5 mg/dL (ref 8.4–10.5)
Chloride: 99 mEq/L (ref 96–112)
Creatinine, Ser: 0.8 mg/dL (ref 0.50–1.35)
GFR calc Af Amer: >90 mL/min (ref >90)
Glucose, Bld: 140 mg/dL — ABNORMAL HIGH (ref 70–99)
POTASSIUM: 3.8 meq/L (ref 3.7–5.3)
SODIUM: 140 meq/L (ref 137–147)
Total Bilirubin: 0.8 mg/dL (ref 0.3–1.2)
Total Protein: 7.3 g/dL (ref 6.0–8.3)

## 2014-02-03 LAB — LIPASE, BLOOD: Lipase: 449 U/L — ABNORMAL HIGH (ref 11–59)

## 2014-02-03 LAB — I-STAT TROPONIN, ED: Troponin i, poc: 0 ng/mL (ref 0.00–0.08)

## 2014-02-03 MED ORDER — FOLIC ACID 1 MG PO TABS
1.0000 mg | ORAL_TABLET | Freq: Every day | ORAL | Status: DC
  Filled 2014-02-03: qty 1

## 2014-02-03 MED ORDER — VITAMIN B-1 100 MG PO TABS
100.0000 mg | ORAL_TABLET | Freq: Every day | ORAL | Status: DC
  Filled 2014-02-03: qty 1

## 2014-02-03 MED ORDER — KETOROLAC TROMETHAMINE 30 MG/ML IJ SOLN
30.0000 mg | Freq: Once | INTRAMUSCULAR | Status: AC
  Administered 2014-02-03: 30 mg via INTRAVENOUS
  Filled 2014-02-03: qty 1

## 2014-02-03 MED ORDER — PANTOPRAZOLE SODIUM 40 MG IV SOLR
40.0000 mg | INTRAVENOUS | Status: DC
  Administered 2014-02-03 – 2014-02-04 (×2): 40 mg via INTRAVENOUS
  Filled 2014-02-03 (×3): qty 40

## 2014-02-03 MED ORDER — PANCRELIPASE (LIP-PROT-AMYL) 12000-38000 UNITS PO CPEP
12000.0000 [IU] | ORAL_CAPSULE | Freq: Three times a day (TID) | ORAL | Status: DC
  Administered 2014-02-04 – 2014-02-05 (×4): 12000 [IU] via ORAL
  Filled 2014-02-03 (×8): qty 1

## 2014-02-03 MED ORDER — FLEET ENEMA 7-19 GM/118ML RE ENEM
1.0000 | ENEMA | Freq: Every day | RECTAL | Status: DC | PRN
  Filled 2014-02-03: qty 1

## 2014-02-03 MED ORDER — ACETAMINOPHEN 325 MG PO TABS
650.0000 mg | ORAL_TABLET | Freq: Four times a day (QID) | ORAL | Status: DC | PRN
  Filled 2014-02-03: qty 2

## 2014-02-03 MED ORDER — ALUM & MAG HYDROXIDE-SIMETH 200-200-20 MG/5ML PO SUSP
30.0000 mL | Freq: Four times a day (QID) | ORAL | Status: DC | PRN
  Administered 2014-02-04: 30 mL via ORAL
  Filled 2014-02-03: qty 30

## 2014-02-03 MED ORDER — DEXTROSE-NACL 5-0.9 % IV SOLN
INTRAVENOUS | Status: DC
  Administered 2014-02-03: 125 mL/h via INTRAVENOUS
  Administered 2014-02-04: 13:00:00 via INTRAVENOUS
  Administered 2014-02-04: 125 mL/h via INTRAVENOUS
  Administered 2014-02-05: 02:00:00 via INTRAVENOUS

## 2014-02-03 MED ORDER — MAGNESIUM HYDROXIDE 400 MG/5ML PO SUSP: 30.0000 mL | Freq: Every day | ORAL | Status: DC | PRN

## 2014-02-03 MED ORDER — SENNOSIDES-DOCUSATE SODIUM 8.6-50 MG PO TABS
2.0000 | ORAL_TABLET | Freq: Every day | ORAL | Status: DC
  Administered 2014-02-03 – 2014-02-05 (×3): 2 via ORAL
  Filled 2014-02-03 (×3): qty 2

## 2014-02-03 MED ORDER — ALBUTEROL SULFATE (2.5 MG/3ML) 0.083% IN NEBU: 2.5000 mg | INHALATION_SOLUTION | RESPIRATORY_TRACT | Status: DC | PRN

## 2014-02-03 MED ORDER — PANTOPRAZOLE SODIUM 40 MG IV SOLR
40.0000 mg | INTRAVENOUS | Status: DC
  Filled 2014-02-03: qty 40

## 2014-02-03 MED ORDER — VITAMIN B-1 100 MG PO TABS
100.0000 mg | ORAL_TABLET | Freq: Every day | ORAL | Status: DC
  Administered 2014-02-03 – 2014-02-06 (×4): 100 mg via ORAL
  Filled 2014-02-03 (×4): qty 1

## 2014-02-03 MED ORDER — GUAIFENESIN-DM 100-10 MG/5ML PO SYRP
5.0000 mL | ORAL_SOLUTION | ORAL | Status: DC | PRN
  Filled 2014-02-03: qty 5

## 2014-02-03 MED ORDER — ADULT MULTIVITAMIN W/MINERALS CH
1.0000 | ORAL_TABLET | Freq: Every day | ORAL | Status: DC
Start: 2014-02-03 — End: 2014-02-03
  Filled 2014-02-03: qty 1

## 2014-02-03 MED ORDER — THIAMINE HCL 100 MG/ML IJ SOLN
100.0000 mg | Freq: Every day | INTRAMUSCULAR | Status: DC
  Filled 2014-02-03 (×3): qty 1

## 2014-02-03 MED ORDER — ONDANSETRON HCL 4 MG/2ML IJ SOLN
4.0000 mg | Freq: Once | INTRAMUSCULAR | Status: AC
Start: 2014-02-03 — End: 2014-02-03
  Administered 2014-02-03: 4 mg via INTRAVENOUS
  Filled 2014-02-03: qty 2

## 2014-02-03 MED ORDER — ENOXAPARIN SODIUM 40 MG/0.4ML ~~LOC~~ SOLN
40.0000 mg | SUBCUTANEOUS | Status: DC
  Administered 2014-02-03 – 2014-02-05 (×3): 40 mg via SUBCUTANEOUS
  Filled 2014-02-03 (×4): qty 0.4

## 2014-02-03 MED ORDER — THIAMINE HCL 100 MG/ML IJ SOLN
100.0000 mg | Freq: Every day | INTRAMUSCULAR | Status: DC
  Filled 2014-02-03: qty 1

## 2014-02-03 MED ORDER — LORAZEPAM 2 MG/ML IJ SOLN: 1.0000 mg | Freq: Four times a day (QID) | INTRAMUSCULAR | Status: DC | PRN

## 2014-02-03 MED ORDER — ONDANSETRON HCL 4 MG PO TABS: 4.0000 mg | ORAL_TABLET | Freq: Four times a day (QID) | ORAL | Status: DC | PRN

## 2014-02-03 MED ORDER — ACETAMINOPHEN 650 MG RE SUPP: 650.0000 mg | Freq: Four times a day (QID) | RECTAL | Status: DC | PRN

## 2014-02-03 MED ORDER — FOLIC ACID 1 MG PO TABS
1.0000 mg | ORAL_TABLET | Freq: Every day | ORAL | Status: DC
  Administered 2014-02-03 – 2014-02-06 (×4): 1 mg via ORAL
  Filled 2014-02-03 (×4): qty 1

## 2014-02-03 MED ORDER — MORPHINE SULFATE 2 MG/ML IJ SOLN: 2.0000 mg | INTRAMUSCULAR | Status: DC | PRN

## 2014-02-03 MED ORDER — LORAZEPAM 1 MG PO TABS
1.0000 mg | ORAL_TABLET | Freq: Four times a day (QID) | ORAL | Status: DC | PRN
  Filled 2014-02-03: qty 1

## 2014-02-03 MED ORDER — OXYCODONE HCL 5 MG PO TABS
5.0000 mg | ORAL_TABLET | ORAL | Status: DC | PRN
  Administered 2014-02-03 – 2014-02-05 (×4): 5 mg via ORAL
  Filled 2014-02-03 (×4): qty 1

## 2014-02-03 MED ORDER — POLYETHYLENE GLYCOL 3350 17 G PO PACK
17.0000 g | PACK | Freq: Every day | ORAL | Status: DC
  Administered 2014-02-03 – 2014-02-06 (×4): 17 g via ORAL
  Filled 2014-02-03 (×4): qty 1

## 2014-02-03 MED ORDER — ONDANSETRON HCL 4 MG/2ML IJ SOLN: 4.0000 mg | Freq: Four times a day (QID) | INTRAMUSCULAR | Status: DC | PRN

## 2014-02-03 MED ORDER — ADULT MULTIVITAMIN W/MINERALS CH
1.0000 | ORAL_TABLET | Freq: Every day | ORAL | Status: DC
  Administered 2014-02-03 – 2014-02-06 (×4): 1 via ORAL
  Filled 2014-02-03 (×4): qty 1

## 2014-02-03 NOTE — Progress Notes (Signed)
Alexander Schmitt [id number] Admission Data: 02/03/2014 7:08 PM Attending Provider: Jonetta Osgood, MD  AOZ:[id number], Andrey Spearman, MD Consults/ Treatment Team:    Alexander Schmitt is a 63 y.o. male patient admitted from ED awake, alert  & orientated  X 3,  Full Code, VSS - Blood pressure 102/70, pulse 67, temperature 97.9 F (36.6 C), temperature source Oral, resp. rate 18, height 6' (1.829 m), weight 70.444 kg (155 lb 4.8 oz), SpO2 98.00%.,  no c/o shortness of breath, no c/o chest pain, no distress noted.   IV site WDL:  Right A/C  Allergies:  No Known Allergies   Past Medical History  Diagnosis Date  . Intestine disorder     intestinal blockage  . History of stomach ulcers   . Pancreatitis   . GERD (gastroesophageal reflux disease)     occasional   . Arthritis     left shoulder   . Cirrhosis     hx of      Tobacco/alcohol: last drank alcohol 4 days ago (per patient)  Pt orientation to unit, room and routine. Information packet given to patient/family and. Admission INP armband ID verified with patient/family, and in place. SR up x 2, fall risk assessment complete with Patient and family verbalizing understanding of risks associated with falls. Pt verbalizes an understanding of how to use the call bell and to call for help before getting out of bed.  Skin, clean-dry- intact without evidence of bruising, or skin tears.   No evidence of skin break down noted on exam.    Will cont to monitor and assist as needed.  Dayle Points, RN 02/03/2014 7:08 PM

## 2014-02-03 NOTE — ED Notes (Signed)
Admitting MD at bedside.

## 2014-02-03 NOTE — H&P (Signed)
PATIENT DETAILS Name: Alexander Schmitt Age: 63 y.o. Sex: male Date of Birth: 1950-11-14 Admit Date: 02/03/2014 PPI:[id number], Andrey Spearman, MD   CHIEF COMPLAINT:  Abdominal pain-4 days  HPI: Alexander Schmitt is a 63 y.o. 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 The patient, approximately 4 days ago he started developing mid abdominal pain, which he describes as stabbing and without radiation. Pain is 10/10 at its worst. There is no radiation of the pain. Pain is associated with numerous episodes of nausea and vomiting. Patient claims that every time he eats or drinks, he vomits. Unfortunately, patient continues to drink, in spite of being told by numerous physicians that he needs to completely stay away from alcohol. Patient claims that his last drink was 4 days back. He presented to the hospital with these complaints, was found to have elevated lipase levels. I was asked to admit this patient for further evaluation and treatment. During my evaluation, patient seemed comfortable, was ambulatory in the ED. He denies any headache, fever, chest pain, shortness of breath or diarrhea.  ALLERGIES:  No Known Allergies  PAST MEDICAL HISTORY: Past Medical History  Diagnosis Date  . Intestine disorder     intestinal blockage  . History of stomach ulcers   . Pancreatitis   . GERD (gastroesophageal reflux disease)     occasional   . Arthritis     left shoulder   . Cirrhosis     hx of     PAST SURGICAL HISTORY: Past Surgical History  Procedure Laterality Date  . Inguinal hernia repair Right 09/29/2012    Procedure: HERNIA REPAIR INGUINAL ADULT;  Surgeon: Imogene Burn. Tsuei, MD;  Location: [facility];  Service: General;  Laterality: Right;  . Umbilical hernia repair N/A 09/29/2012    Procedure: HERNIA REPAIR UMBILICAL ADULT;  Surgeon: Imogene Burn. Georgette Dover, MD;  Location: [facility]  [facility];  Service: General;  Laterality: N/A;  . Insertion of mesh Right 09/29/2012    Procedure: INSERTION OF MESH;  Surgeon: Imogene Burn. Georgette Dover, MD;  Location: [facility];  Service: General;  Laterality: Right;  . Eus N/A 07/13/2013    Procedure: ESOPHAGEAL ENDOSCOPIC ULTRASOUND (EUS) RADIAL;  Surgeon: Arta Silence, MD;  Location: [facility];  Service: Endoscopy;  Laterality: N/A;    MEDICATIONS AT HOME: Prior to Admission medications   Medication Sig Start Date End Date Taking? Authorizing Provider  magnesium citrate SOLN Take 0.5 Bottles by mouth daily as needed for moderate constipation or severe constipation.   Yes Historical Provider, MD  magnesium hydroxide (MILK OF MAGNESIA) 400 MG/5ML suspension Take 30 mLs by mouth daily as needed for mild constipation or moderate constipation.   Yes Historical Provider, MD  Phenyleph-Doxylamine-DM-APAP (ALKA SELTZER PLUS PO) Take 2 capsules by mouth daily as needed (for cold).   Yes Historical Provider, MD  lipase/protease/amylase (CREON-12/PANCREASE) 12000 UNITS CPEP capsule Take 1 capsule by mouth 3 (three) times daily with meals.    Historical Provider, MD    FAMILY HISTORY: Family History  Problem Relation Age of Onset  . Hypertension Father     SOCIAL HISTORY:  reports that he has been smoking Cigarettes.  He has a 10 pack-year smoking history. He has never used smokeless tobacco. He reports that he drinks alcohol. He reports that he does not use illicit drugs.  REVIEW OF SYSTEMS:  Constitutional:   No  weight  loss, night sweats,  Fevers, chills, fatigue.  HEENT:    No headaches, Difficulty swallowing,Tooth/dental problems,Sore throat   Cardio-vascular: No chest pain,  Orthopnea, PND, swelling in lower extremities, anasarca, dizziness, palpitations  GI:  No heartburn, indigestion change in  bowel habits, loss of appetite  Resp: No shortness of breath with exertion or at rest.  No excess mucus, no productive cough, No non-productive  cough,  No coughing up of blood.No change in color of mucus.No wheezing.No chest wall deformity  Skin:  no rash or lesions.  GU:  no dysuria, change in color of urine, no urgency or frequency.  No flank pain.  Musculoskeletal: No joint pain or swelling.  No decreased range of motion.  No back pain.  Psych: No change in mood or affect. No depression or anxiety.  No memory loss.   PHYSICAL EXAM: Blood pressure 106/80, pulse 67, temperature 98.3 F (36.8 C), temperature source Oral, resp. rate 15, height 6' (1.829 m), weight 77.111 kg (170 lb), SpO2 99.00%.  General appearance :Awake, alert, not in any distress. Speech Clear. Not toxic Looking HEENT: Atraumatic and Normocephalic, pupils equally reactive to light and accomodation Neck: supple, no JVD. No cervical lymphadenopathy.  Chest:Good air entry bilaterally, no added sounds  CVS: S1 S2 regular, no murmurs.  Abdomen: Bowel sounds present, Tender in the epigastric area, but without distention, rigidity or rebound.  Extremities: B/L Lower Ext shows no edema, both legs are warm to touch Neurology: Awake alert, and oriented X 3, CN II-XII intact, Non focal Skin:No Rash Wounds:N/A  LABS ON ADMISSION:   Recent Labs  02/03/14 1340  NA 140  K 3.8  CL 99  CO2 26  GLUCOSE 140*  BUN 8  CREATININE 0.80  CALCIUM 9.5    Recent Labs  02/03/14 1340  AST 15  ALT 9  ALKPHOS 124*  BILITOT 0.8  PROT 7.3  ALBUMIN 4.0    Recent Labs  02/03/14 1340  LIPASE 449*    Recent Labs  02/03/14 1340  WBC 9.9  NEUTROABS 6.4  HGB 14.7  HCT 44.5  MCV 96.5  PLT 292   No results found for this basename: CKTOTAL, CKMB, CKMBINDEX, TROPONINI,  in the last 72 hours No results found for this basename: DDIMER,  in the last 72 hours No components found with this basename: POCBNP,    RADIOLOGIC STUDIES ON ADMISSION: No results found.   EKG: Independently reviewed. Sinus tachycardia  ASSESSMENT AND PLAN: Present on Admission:    . Acute on chronic Alcoholic pancreatitis - Will admit to medical surgical unit, keep n.p.o., start IV fluids. Avoid supportive care with as needed narcotics and antiemetics. Abdominal exam is fairly benign, patient's Ranson score is only 1. We'll continue to follow closely and make further plans depending on clinical course.  Marland Kitchen History of Pancreatic pseudocyst - Patient does have a history of known pseudocyst, patient underwent a endoscopic ultrasound in January 2015 which showed a small pseudocyst approximately 17x20 mm in size. We'll continue to closely monitor, and repeat imaging as needed.   . Cirrhosis - Suspect alcoholic etiology. Currently without any evidence of decompensation.  Marland Kitchen History of alcohol use - Fortunately continues to use alcohol despite my advice to be completely abstinent. I've counseled extensively. I will place patient on Ativan per [facility]WA protocol. Patient claims that his last drink was more than 4 days back, currently without any signs of any withdrawal.  Further plan will depend as patient's clinical course evolves and further radiologic  and laboratory data become available. Patient will be monitored closely.  Above noted plan was discussed with patient/spouse, they were in agreement.   DVT Prophylaxis: Prophylactic Lovenox  Code Status: Full Code  Total time spent for admission equals 45 minutes.  [facility] Pager [phone]  If 7PM-7AM, please contact night-coverage [email or web address] Password [email or web address] 02/03/2014, 5:37 PM  **Disclaimer: This note may have been dictated with voice recognition software. Similar sounding words can inadvertently be transcribed and this note may contain transcription errors which may not have been corrected upon publication of note.**

## 2014-02-03 NOTE — ED Provider Notes (Signed)
CSN: [id number]     Arrival date & time 02/03/14  1312 History   First MD Initiated Contact with Patient 02/03/14 1641     Chief Complaint  Patient presents with  . Abdominal Pain  . Emesis     (Consider location/radiation/quality/duration/timing/severity/associated sxs/prior Treatment) HPI  Alexander Schmitt is a 63 y.o. male who presents for evaluation of upper abdominal pain, nausea and vomiting. This problem is recurrent. The last time he had it was several months ago. He has not seen his doctor recently. He had esophageal ultrasound for evaluation of the pancreatic cyst,  January 2015. Results of that, are not clear. He denies bloody emesis, diarrhea, rectal bleeding. He does not know what causes his pancreatitis. He denies cough, shortness of breath, chest pain, weakness, or dizziness. There are no other known modifying factors.   Past Medical History  Diagnosis Date  . Intestine disorder     intestinal blockage  . History of stomach ulcers   . Pancreatitis   . GERD (gastroesophageal reflux disease)     occasional   . Arthritis     left shoulder   . Cirrhosis     hx of    Past Surgical History  Procedure Laterality Date  . Inguinal hernia repair Right 09/29/2012    Procedure: HERNIA REPAIR INGUINAL ADULT;  Surgeon: Imogene Burn. Tsuei, MD;  Location: [facility];  Service: General;  Laterality: Right;  . Umbilical hernia repair N/A 09/29/2012    Procedure: HERNIA REPAIR UMBILICAL ADULT;  Surgeon: Imogene Burn. Georgette Dover, MD;  Location: [facility];  Service: General;  Laterality: N/A;  . Insertion of mesh Right 09/29/2012    Procedure: INSERTION OF MESH;  Surgeon: Imogene Burn. Georgette Dover, MD;  Location: [facility];  Service: General;  Laterality: Right;  . Eus N/A 07/13/2013    Procedure: ESOPHAGEAL ENDOSCOPIC ULTRASOUND (EUS) RADIAL;  Surgeon: Arta Silence, MD;  Location: [facility];  Service: Endoscopy;  Laterality: N/A;   Family History  Problem Relation Age of Onset  . Hypertension Father     History  Substance Use Topics  . Smoking status: Light Tobacco Smoker -- 0.25 packs/day for 40 years    Types: Cigarettes  . Smokeless tobacco: Never Used  . Alcohol Use: Yes     Comment: states stopped 3 months ago    Review of Systems  All other systems reviewed and are negative.     Allergies  Review of patient's allergies indicates no known allergies.  Home Medications   Prior to Admission medications   Medication Sig Start Date End Date Taking? Authorizing Provider  lipase/protease/amylase (CREON-12/PANCREASE) 12000 UNITS CPEP capsule Take 1 capsule by mouth 3 (three) times daily with meals.    Historical Provider, MD  Multiple Vitamin (MULTIVITAMIN WITH MINERALS) TABS tablet Take 1 tablet by mouth daily.    Historical Provider, MD   BP 108/66  Pulse 74  Temp(Src) 98.3 F (36.8 C) (Oral)  Resp 20  Ht 6' (1.829 m)  Wt 170 lb (77.111 kg)  BMI 23.05 kg/m2  SpO2 95% Physical Exam  Nursing note and vitals reviewed. Constitutional: He is oriented to person, place, and time. He appears well-developed.  Appears older than stated age, he appears undernourished.  HENT:  Head: Normocephalic and atraumatic.  Right Ear: External ear normal.  Left Ear: External ear normal.  Eyes: Conjunctivae and EOM are normal. Pupils are equal, round, and reactive to light.  Neck: Normal range of motion and phonation normal. Neck supple.  Cardiovascular: Normal  rate, regular rhythm, normal heart sounds and intact distal pulses.   Pulmonary/Chest: Effort normal and breath sounds normal. He exhibits no bony tenderness.  Abdominal: Soft. He exhibits no mass. There is tenderness (Upper, bilateral quadrant, moderate). There is guarding. There is no rebound.  Musculoskeletal: Normal range of motion.  Neurological: He is alert and oriented to person, place, and time. No cranial nerve deficit or sensory deficit. He exhibits normal muscle tone. Coordination normal.  Skin: Skin is warm, dry and  intact.  Psychiatric: He has a normal mood and affect. His behavior is normal. Judgment and thought content normal.    ED Course  Procedures (including critical care time) Medications  ketorolac (TORADOL) 30 MG/ML injection 30 mg (not administered)  ondansetron (ZOFRAN) injection 4 mg (not administered)    Patient Vitals for the past 24 hrs:  BP Temp Temp src Pulse Resp SpO2 Height Weight  02/03/14 1539 108/66 mmHg 98.3 F (36.8 C) Oral 74 20 95 % - -  02/03/14 1322 128/84 mmHg 97.8 F (36.6 C) - 99 20 96 % 6' (1.829 m) 170 lb (77.111 kg)    5:07 PM-Consult complete with Ghimere. Patient case explained and discussed. He agrees to admit patient for further evaluation and treatment. Call ended at 17:18     Labs Review Labs Reviewed  COMPREHENSIVE METABOLIC PANEL - Abnormal; Notable for the following:    Glucose, Bld 140 (*)    Alkaline Phosphatase 124 (*)    All other components within normal limits  LIPASE, BLOOD - Abnormal; Notable for the following:    Lipase 449 (*)    All other components within normal limits  CBC WITH DIFFERENTIAL  ETHANOL  I-STAT TROPOININ, ED    Imaging Review No results found.   EKG Interpretation   Date/Time:  Friday February 03 2014 13:15:51 EDT Ventricular Rate:  105 PR Interval:  128 QRS Duration: 94 QT Interval:  364 QTC Calculation: 481 R Axis:   53 Text Interpretation:  Sinus tachycardia Biatrial enlargement Incomplete  right bundle branch block Abnormal ECG Since last tracing rate faster  Abnormal ECG Confirmed by Eulis Foster  MD, Vira Agar ([id number]) on 02/03/2014 4:57:52  PM      MDM   Final diagnoses:  Alcohol-induced acute pancreatitis    Recurrent Pancreatitis associated with alcohol abuse.  Nursing Notes Reviewed/ Care Coordinated Applicable Imaging Reviewed Interpretation of Laboratory Data incorporated into ED treatment   Plan: admit    Richarda Blade, MD 02/03/14 1726

## 2014-02-03 NOTE — ED Notes (Signed)
Pt c/o upper and generalized abd pain with some N/V; pt sts hx of pancreatitis and sts feels similar

## 2014-02-04 DIAGNOSIS — K863 Pseudocyst of pancreas: Secondary | ICD-10-CM

## 2014-02-04 DIAGNOSIS — E43 Unspecified severe protein-calorie malnutrition: Secondary | ICD-10-CM | POA: Insufficient documentation

## 2014-02-04 DIAGNOSIS — K862 Cyst of pancreas: Secondary | ICD-10-CM

## 2014-02-04 LAB — COMPREHENSIVE METABOLIC PANEL
ALBUMIN: 3 g/dL — AB (ref 3.5–5.2)
ALK PHOS: 88 U/L (ref 39–117)
ALT: 6 U/L (ref 0–53)
ANION GAP: 7 (ref 5–15)
AST: 11 U/L (ref 0–37)
BILIRUBIN TOTAL: 0.6 mg/dL (ref 0.3–1.2)
BUN: 11 mg/dL (ref 6–23)
CO2: 29 mEq/L (ref 19–32)
Calcium: 8.5 mg/dL (ref 8.4–10.5)
Chloride: 103 mEq/L (ref 96–112)
Creatinine, Ser: 1 mg/dL (ref 0.50–1.35)
GFR calc Af Amer: >90 mL/min (ref >90)
GFR calc non Af Amer: 79 mL/min — ABNORMAL LOW (ref >90)
Glucose, Bld: 115 mg/dL — ABNORMAL HIGH (ref 70–99)
POTASSIUM: 3.6 meq/L — AB (ref 3.7–5.3)
Sodium: 139 mEq/L (ref 137–147)
TOTAL PROTEIN: 5.5 g/dL — AB (ref 6.0–8.3)

## 2014-02-04 LAB — CBC
HEMATOCRIT: 37.1 % — AB (ref 39.0–52.0)
Hemoglobin: 11.8 g/dL — ABNORMAL LOW (ref 13.0–17.0)
MCH: 31.8 pg (ref 26.0–34.0)
MCHC: 31.8 g/dL (ref 30.0–36.0)
MCV: 100 fL (ref 78.0–100.0)
Platelets: 201 10*3/uL (ref 150–400)
RBC: 3.71 MIL/uL — ABNORMAL LOW (ref 4.22–5.81)
RDW: 14.8 % (ref 11.5–15.5)
WBC: 6.3 10*3/uL (ref 4.0–10.5)

## 2014-02-04 LAB — LIPASE, BLOOD: Lipase: 227 U/L — ABNORMAL HIGH (ref 11–59)

## 2014-02-04 MED ORDER — BOOST / RESOURCE BREEZE PO LIQD
1.0000 | Freq: Three times a day (TID) | ORAL | Status: DC
  Administered 2014-02-04 – 2014-02-06 (×5): 1 via ORAL

## 2014-02-04 NOTE — Progress Notes (Signed)
INITIAL NUTRITION ASSESSMENT  DOCUMENTATION CODES Per approved criteria  -Severe malnutrition in the context of chronic illness   Pt meets criteria for severe MALNUTRITION in the context of chronic illness as evidenced by 6% wt loss x 1 month, severe muscle depletion, <75% of estimated energy intake x 1 month.  INTERVENTION: Resource Breeze po TID, each supplement provides 250 kcal and 9 grams of protein  NUTRITION DIAGNOSIS: Inadequate oral intake related to altered GI function as evidenced by severe muscle depletion, diet hx.   Goal: Pt will meet >90% of estimated nutritional needs  Monitor:  Diet advancement, PO intake, labs, weight changes, I/O's  Reason for Assessment: MST  63 y.o. male  Admitting Dx: <principal problem not specified>  Alexander Schmitt is a 63 y.o. 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 The patient, approximately 4 days ago he started developing mid abdominal pain, which he describes as stabbing and without radiation. Pain is 10/10 at its worst. There is no radiation of the pain. Pain is associated with numerous episodes of nausea and vomiting. Patient claims that every time he eats or drinks, he vomits. Unfortunately, patient continues to drink, in spite of being told by numerous physicians that he needs to completely stay away from alcohol. Patient claims that his last drink was 4 days back. He presented to the hospital with these complaints, was found to have elevated lipase levels. I was asked to admit this patient for further evaluation and treatment.  During my evaluation, patient seemed comfortable, was ambulatory in the ED. He denies any headache, fever, chest pain, shortness of breath or diarrhea.  ASSESSMENT: Pt admitted with pancreatitis. Pt with hx of alcoholism; last drink was 4 days ago. Noted orders for thiamine,  folic acid, and MVI.  Wt hx reveals 6% wt loss x 1 month. Pt and wife confirm wt loss x 1 month and UBW of 165#. Pt wife reported that she she started noticing wt loss over the past several days dye to poor intake. Pt reports poor appetite x 2 days. He describes his appetite as baseline. Diet hx reveals Breakfast: eggs and bacon; Lunch: skip; Dinner: steak, potatoes, pinto beans. Beverages include juice and milk. He reports he has taken nutritional supplements in the past, but has not been consistent in taking them. He estimates he has had 1-2 in the past 3 weeks.  Pt is currently on a clear liquid diet. He reports good tolerance of breakfast. Noted 100% completion of meal tray in room.  Educated pt and wife on importance of good PO intake to promote healing and improve nutritional status. Pt is very grateful for RD visit.   Nutrition Focused Physical Exam:  Subcutaneous Fat:  Orbital Region: moderate depletion Upper Arm Region: mild depletion Thoracic and Lumbar Region: mild depletion  Muscle:  Temple Region: moderate depletion Clavicle Bone Region: severe depletioon Clavicle and Acromion Bone Region: moderate depletion Scapular Bone Region: severe depletion Dorsal Hand: moderate depletion Patellar Region: severe depletion Anterior Thigh Region: severe depletion Posterior Calf Region: moderate depletion  Edema: none present  Labs reviewed. K: 3.6. Glucose: 115.  Height: Ht Readings from Last 1 Encounters:  02/03/14 6' (1.829 m)    Weight: Wt Readings from Last 1 Encounters:  02/03/14 155 lb 4.8 oz (70.444 kg)    Ideal Body Weight: 178#  % Ideal Body Weight: 87%  Wt Readings from Last  10 Encounters:  02/03/14 155 lb 4.8 oz (70.444 kg)  07/13/13 165 lb (74.844 kg)  07/13/13 165 lb (74.844 kg)  05/05/13 157 lb 10.1 oz (71.5 kg)  09/23/12 160 lb 3.2 oz (72.666 kg)  09/20/12 161 lb (73.029 kg)  05/24/12 163 lb 3.2 oz (74.027 kg)  11/24/11 160 lb 11.5 oz (72.9 kg)     Usual Body Weight: 160#  % Usual Body Weight: 97%  BMI:  Body mass index is 21.06 kg/(m^2). Normal weight  Estimated Nutritional Needs: Kcal: 1800-2000 Protein: 85-95 grams Fluid: 1.8-2.0 L  Skin: WDL  Diet Order: Clear Liquid  EDUCATION NEEDS: -Education needs addressed   Intake/Output Summary (Last 24 hours) at 02/04/14 1028 Last data filed at 02/04/14 0800  Gross per 24 hour  Intake 1677.09 ml  Output      0 ml  Net 1677.09 ml    Last BM: 02/03/14  Labs:   Recent Labs Lab 02/03/14 1340 02/04/14 0553  NA 140 139  K 3.8 3.6*  CL 99 103  CO2 26 29  BUN 8 11  CREATININE 0.80 1.00  CALCIUM 9.5 8.5  GLUCOSE 140* 115*    CBG (last 3)  No results found for this basename: GLUCAP,  in the last 72 hours  Scheduled Meds: . enoxaparin (LOVENOX) injection  40 mg Subcutaneous Q24H  . folic acid  1 mg Oral Daily  . lipase/protease/amylase  12,000 Units Oral TID WC  . multivitamin with minerals  1 tablet Oral Daily  . pantoprazole (PROTONIX) IV  40 mg Intravenous Q24H  . polyethylene glycol  17 g Oral Daily  . senna-docusate  2 tablet Oral QHS  . thiamine  100 mg Oral Daily   Or  . thiamine  100 mg Intravenous Daily    Continuous Infusions: . dextrose 5 % and 0.9% NaCl 75 mL/hr at 02/04/14 3825    Past Medical History  Diagnosis Date  . Intestine disorder     intestinal blockage  . History of stomach ulcers   . Pancreatitis   . GERD (gastroesophageal reflux disease)     occasional   . Arthritis     left shoulder   . Cirrhosis     hx of     Past Surgical History  Procedure Laterality Date  . Inguinal hernia repair Right 09/29/2012    Procedure: HERNIA REPAIR INGUINAL ADULT;  Surgeon: Imogene Burn. Tsuei, MD;  Location: [facility];  Service: General;  Laterality: Right;  . Umbilical hernia repair N/A 09/29/2012    Procedure: HERNIA REPAIR UMBILICAL ADULT;  Surgeon: Imogene Burn. Georgette Dover, MD;  Location: [facility];  Service: General;  Laterality: N/A;  .  Insertion of mesh Right 09/29/2012    Procedure: INSERTION OF MESH;  Surgeon: Imogene Burn. Georgette Dover, MD;  Location: [facility];  Service: General;  Laterality: Right;  . Eus N/A 07/13/2013    Procedure: ESOPHAGEAL ENDOSCOPIC ULTRASOUND (EUS) RADIAL;  Surgeon: Arta Silence, MD;  Location: [facility];  Service: Endoscopy;  Laterality: N/A;    Peytan Andringa A. Jimmye Norman, RD, LDN Pager: [phone] After hours Pager: [phone]

## 2014-02-04 NOTE — Progress Notes (Signed)
PATIENT DETAILS Name: Alexander Schmitt Age: 63 y.o. Sex: male Date of Birth: 03-16-1951 Admit Date: 02/03/2014 Admitting Physician Evalee Mutton Kristeen Mans, MD [id number], Andrey Spearman, MD  Subjective: Abdominal pain almost resolved this morning. Denies any major complaints.  Assessment/Plan: Active Problems: Acute on chronic Alcoholic pancreatitis - Patient was admitted, given supportive care with IV fluids, as needed narcotics and kept n.p.o. Overnight, significantly improved, will start clear liquids. On exam, there is very minimal epigastric tenderness. If continues to improve, suspect diet can be advanced, and home in the next few days  History of Pancreatic pseudocyst  - Patient does have a history of known pseudocyst, patient underwent a endoscopic ultrasound in January 2015 which showed a small pseudocyst approximately 17x20 mm in size. We'll continue to closely monitor, and repeat imaging as needed.   Cirrhosis  - Suspect alcoholic etiology. Currently without any evidence of decompensation.   History of alcohol use  - Claims lasting 4 days prior to admission - No signs of withdrawal - Continue with Ativan per CIWA protocol  Disposition: Remain inpatient  DVT Prophylaxis: Prophylactic Lovenox   Code Status: Full code  Family Communication Spouse at bedside  Procedures:  None  CONSULTS:  None  Time spent 40 minutes-which includes 50% of the time with face-to-face with patient/ family and coordinating care related to the above assessment and plan.    MEDICATIONS: Scheduled Meds: . enoxaparin (LOVENOX) injection  40 mg Subcutaneous Q24H  . folic acid  1 mg Oral Daily  . lipase/protease/amylase  12,000 Units Oral TID WC  . multivitamin with minerals  1 tablet Oral Daily  . pantoprazole (PROTONIX) IV  40 mg Intravenous Q24H  . polyethylene glycol  17 g Oral Daily  . senna-docusate  2 tablet Oral QHS  . thiamine  100 mg Oral Daily   Or  . thiamine  100 mg  Intravenous Daily   Continuous Infusions: . dextrose 5 % and 0.9% NaCl 125 mL/hr (02/04/14 0338)   PRN Meds:.acetaminophen, acetaminophen, albuterol, alum & mag hydroxide-simeth, guaiFENesin-dextromethorphan, LORazepam, LORazepam, magnesium hydroxide, morphine injection, ondansetron (ZOFRAN) IV, ondansetron, oxyCODONE, sodium phosphate  Antibiotics: Anti-infectives   None       PHYSICAL EXAM: Vital signs in last 24 hours: Filed Vitals:   02/03/14 1820 02/03/14 2139 02/04/14 0000 02/04/14 0506  BP: 102/70 119/72  108/64  Pulse: 67 62 64 58  Temp: 97.9 F (36.6 C) 97.9 F (36.6 C)  97.9 F (36.6 C)  TempSrc: Oral Oral  Oral  Resp: 18 16  16   Height: 6' (1.829 m)     Weight: 70.444 kg (155 lb 4.8 oz)     SpO2: 98% 97%  99%    Weight change:  Filed Weights   02/03/14 1322 02/03/14 1820  Weight: 77.111 kg (170 lb) 70.444 kg (155 lb 4.8 oz)   Body mass index is 21.06 kg/(m^2).   Gen Exam: Awake and alert with clear speech.   Neck: Supple, No JVD.   Chest: B/L Clear.   CVS: S1 S2 Regular, no murmurs.  Abdomen: soft, BS +, minimally tender in epigastric area, non distended.  Extremities: no edema, lower extremities warm to touch. Neurologic: Non Focal.   Skin: No Rash.   Wounds: N/A.   Intake/Output from previous day:  Intake/Output Summary (Last 24 hours) at 02/04/14 0859 Last data filed at 02/04/14 0800  Gross per 24 hour  Intake 1677.09 ml  Output      0 ml  Net 1677.09 ml     LAB RESULTS: CBC  Recent Labs Lab 02/03/14 1340 02/04/14 0553  WBC 9.9 6.3  HGB 14.7 11.8*  HCT 44.5 37.1*  PLT 292 201  MCV 96.5 100.0  MCH 31.9 31.8  MCHC 33.0 31.8  RDW 14.8 14.8  LYMPHSABS 2.7  --   MONOABS 0.7  --   EOSABS 0.1  --   BASOSABS 0.0  --     Chemistries   Recent Labs Lab 02/03/14 1340 02/04/14 0553  NA 140 139  K 3.8 3.6*  CL 99 103  CO2 26 29  GLUCOSE 140* 115*  BUN 8 11  CREATININE 0.80 1.00  CALCIUM 9.5 8.5    CBG: No results found  for this basename: GLUCAP,  in the last 168 hours  GFR Estimated Creatinine Clearance: 76.3 ml/min (by C-G formula based on Cr of 1).  Coagulation profile No results found for this basename: INR, PROTIME,  in the last 168 hours  Cardiac Enzymes No results found for this basename: CK, CKMB, TROPONINI, MYOGLOBIN,  in the last 168 hours  No components found with this basename: POCBNP,  No results found for this basename: DDIMER,  in the last 72 hours No results found for this basename: HGBA1C,  in the last 72 hours No results found for this basename: CHOL, HDL, LDLCALC, TRIG, CHOLHDL, LDLDIRECT,  in the last 72 hours No results found for this basename: TSH, T4TOTAL, FREET3, T3FREE, THYROIDAB,  in the last 72 hours No results found for this basename: VITAMINB12, FOLATE, FERRITIN, TIBC, IRON, RETICCTPCT,  in the last 72 hours  Recent Labs  02/03/14 1340 02/04/14 0553  LIPASE 449* 227*    Urine Studies No results found for this basename: UACOL, UAPR, USPG, UPH, UTP, UGL, UKET, UBIL, UHGB, UNIT, UROB, ULEU, UEPI, UWBC, URBC, UBAC, CAST, CRYS, UCOM, BILUA,  in the last 72 hours  MICROBIOLOGY: No results found for this or any previous visit (from the past 240 hour(s)).  RADIOLOGY STUDIES/RESULTS: No results found.  Oren Binet, MD  [facility] Pager:[phone]  If 7PM-7AM, please contact night-coverage [email or web address] Password [email or web address] 02/04/2014, 8:59 AM   LOS: 1 day   **Disclaimer: This note may have been dictated with voice recognition software. Similar sounding words can inadvertently be transcribed and this note may contain transcription errors which may not have been corrected upon publication of note.**

## 2014-02-05 MED ORDER — PANCRELIPASE (LIP-PROT-AMYL) 12000-38000 UNITS PO CPEP
24000.0000 [IU] | ORAL_CAPSULE | Freq: Three times a day (TID) | ORAL | Status: DC
  Administered 2014-02-05 – 2014-02-06 (×3): 24000 [IU] via ORAL
  Filled 2014-02-05 (×6): qty 2

## 2014-02-05 MED ORDER — PANTOPRAZOLE SODIUM 40 MG PO TBEC: 40.0000 mg | DELAYED_RELEASE_TABLET | Freq: Every day | ORAL | Status: DC

## 2014-02-05 MED ORDER — ONDANSETRON HCL 4 MG PO TABS: 4.0000 mg | ORAL_TABLET | Freq: Four times a day (QID) | ORAL | Status: DC | PRN

## 2014-02-05 MED ORDER — POLYETHYLENE GLYCOL 3350 17 G PO PACK: 17.0000 g | PACK | Freq: Every day | ORAL | Status: DC

## 2014-02-05 MED ORDER — BOOST / RESOURCE BREEZE PO LIQD: 1.0000 | Freq: Three times a day (TID) | ORAL | Status: DC

## 2014-02-05 MED ORDER — THIAMINE HCL 100 MG PO TABS: 100.0000 mg | ORAL_TABLET | Freq: Every day | ORAL | Status: DC

## 2014-02-05 MED ORDER — FOLIC ACID 1 MG PO TABS: 1.0000 mg | ORAL_TABLET | Freq: Every day | ORAL | Status: DC

## 2014-02-05 MED ORDER — OXYCODONE HCL 5 MG PO TABS: 5.0000 mg | ORAL_TABLET | ORAL | Status: DC | PRN

## 2014-02-05 MED ORDER — PANTOPRAZOLE SODIUM 40 MG PO TBEC
40.0000 mg | DELAYED_RELEASE_TABLET | Freq: Every day | ORAL | Status: DC
  Administered 2014-02-05: 40 mg via ORAL
  Filled 2014-02-05: qty 1

## 2014-02-05 NOTE — Discharge Summary (Signed)
PATIENT DETAILS Name: Alexander Schmitt Age: 63 y.o. Sex: male Date of Birth: 02-11-1951 MRN: [id number]. Admit Date: 02/03/2014 Admitting Physician: Jonetta Osgood, MD [id number], Andrey Spearman, MD  Recommendations for Outpatient Follow-up:  1. Please continue to counsel regarding abstinence from alcohol. 2. Please refer to GI for follow up 3. Gen Health Maintenance 4. Please repeat CBC/BMET at next visit  PRIMARY DISCHARGE DIAGNOSIS:  Active Problems:   Cirrhosis   Malnutrition of moderate degree   Pancreatic pseudocyst   Acute on Chrnonic Alcoholic pancreatitis   Protein-calorie malnutrition, severe      PAST MEDICAL HISTORY: Past Medical History  Diagnosis Date  . Intestine disorder     intestinal blockage  . History of stomach ulcers   . Pancreatitis   . GERD (gastroesophageal reflux disease)     occasional   . Arthritis     left shoulder   . Cirrhosis     hx of     DISCHARGE MEDICATIONS:   Medication List         ALKA SELTZER PLUS PO  Take 2 capsules by mouth daily as needed (for cold).     feeding supplement (RESOURCE BREEZE) Liqd  Take 1 Container by mouth 3 (three) times daily between meals.     folic acid 1 MG tablet  Commonly known as:  FOLVITE  Take 1 tablet (1 mg total) by mouth daily.     lipase/protease/amylase 12000 UNITS Cpep capsule  Commonly known as:  CREON  Take 1 capsule by mouth 3 (three) times daily with meals.     magnesium citrate Soln  Take 0.5 Bottles by mouth daily as needed for moderate constipation or severe constipation.     magnesium hydroxide 400 MG/5ML suspension  Commonly known as:  MILK OF MAGNESIA  Take 30 mLs by mouth daily as needed for mild constipation or moderate constipation.     ondansetron 4 MG tablet  Commonly known as:  ZOFRAN  Take 1 tablet (4 mg total) by mouth every 6 (six) hours as needed for nausea.     oxyCODONE 5 MG immediate release tablet  Commonly known as:  Oxy IR/ROXICODONE  Take 1 tablet (5  mg total) by mouth every 4 (four) hours as needed for moderate pain.     pantoprazole 40 MG tablet  Commonly known as:  PROTONIX  Take 1 tablet (40 mg total) by mouth daily at 8 pm.     polyethylene glycol packet  Commonly known as:  MIRALAX / GLYCOLAX  Take 17 g by mouth daily.     thiamine 100 MG tablet  Take 1 tablet (100 mg total) by mouth daily.        ALLERGIES:  No Known Allergies  BRIEF HPI:  See H&P, Labs, Consult and Test reports for all details in brief, patient was admitted for evaluation of abdominal pain. Patient has a hx of alcoholism, chronic pancreatitis and cirrhosis. Further evaluation in the ED, revealed a significantly elevated Lipase level.  CONSULTATIONS:   None  PERTINENT RADIOLOGIC STUDIES: No results found.   PERTINENT LAB RESULTS: CBC:  Recent Labs  02/03/14 1340 02/04/14 0553  WBC 9.9 6.3  HGB 14.7 11.8*  HCT 44.5 37.1*  PLT 292 201   CMET CMP     Component Value Date/Time   NA 139 02/04/2014 0553   K 3.6* 02/04/2014 0553   CL 103 02/04/2014 0553   CO2 29 02/04/2014 0553   GLUCOSE 115* 02/04/2014 0553   BUN 11  02/04/2014 0553   CREATININE 1.00 02/04/2014 0553   CALCIUM 8.5 02/04/2014 0553   PROT 5.5* 02/04/2014 0553   ALBUMIN 3.0* 02/04/2014 0553   AST 11 02/04/2014 0553   ALT 6 02/04/2014 0553   ALKPHOS 88 02/04/2014 0553   BILITOT 0.6 02/04/2014 0553   GFRNONAA 79* 02/04/2014 0553   GFRAA >90 02/04/2014 0553    GFR Estimated Creatinine Clearance: 76.3 ml/min (by C-G formula based on Cr of 1).  Recent Labs  02/03/14 1340 02/04/14 0553  LIPASE 449* 227*   No results found for this basename: CKTOTAL, CKMB, CKMBINDEX, TROPONINI,  in the last 72 hours No components found with this basename: POCBNP,  No results found for this basename: DDIMER,  in the last 72 hours No results found for this basename: HGBA1C,  in the last 72 hours No results found for this basename: CHOL, HDL, LDLCALC, TRIG, CHOLHDL, LDLDIRECT,  in the last 72 hours No results found  for this basename: TSH, T4TOTAL, FREET3, T3FREE, THYROIDAB,  in the last 72 hours No results found for this basename: VITAMINB12, FOLATE, FERRITIN, TIBC, IRON, RETICCTPCT,  in the last 72 hours Coags: No results found for this basename: PT, INR,  in the last 72 hours Microbiology: No results found for this or any previous visit (from the past 240 hour(s)).   BRIEF HOSPITAL COURSE:  Acute on chronic Alcoholic pancreatitis  - Patient was admitted, given supportive care with IV fluids, as needed narcotics and kept n.p.o.Significantly and rapidly improved, diet slowly advanced to low fat diet which he has tolerated, patient is being discharged home in a stable manner.  I have counseled him extensively regarding alcohol abstinence.On exam no further epigastric tenderness.   History of Pancreatic pseudocyst  - Patient does have a history of known pseudocyst, patient underwent a endoscopic ultrasound in January 2015 which showed a small pseudocyst approximately 17x20 mm in size. Repeat imaging as needed-suspect can be pursued in the outpatient setting at the discretion of his PCP and Primary GI md. I have counseled him extensively regarding importance of  alcohol abstinence.   Cirrhosis  - Suspect alcoholic etiology. Currently without any evidence of decompensation.   History of alcohol use  - Claims lasting 4 days prior to admission  - No signs of withdrawal  - Placed on Ativan per CIWA protocol-since no signs of withdrawal  Severe malnutrition  - Started on supplements-continue  TODAY-DAY OF DISCHARGE:  Subjective:   Alexander Schmitt today has no headache,no chest abdominal pain,no new weakness tingling or numbness, feels much better wants to go home today.   Objective:   Blood pressure 159/82, pulse 92, temperature 98.5 F (36.9 C), temperature source Oral, resp. rate 16, height 6' (1.829 m), weight 70.444 kg (155 lb 4.8 oz), SpO2 98.00%.  Intake/Output Summary (Last 24 hours) at  02/05/14 1808 Last data filed at 02/05/14 0534  Gross per 24 hour  Intake 726.25 ml  Output    350 ml  Net 376.25 ml   Filed Weights   02/03/14 1322 02/03/14 1820  Weight: 77.111 kg (170 lb) 70.444 kg (155 lb 4.8 oz)    Exam Awake Alert, Oriented *3, No new F.N deficits, Normal affect [place].AT,PERRAL Supple Neck,No JVD, No cervical lymphadenopathy appriciated.  Symmetrical Chest wall movement, Good air movement bilaterally, CTAB RRR,No Gallops,Rubs or new Murmurs, No Parasternal Heave +ve B.Sounds, Abd Soft, Non tender, No organomegaly appreciated, No rebound -guarding or rigidity. No Cyanosis, Clubbing or edema, No new Rash or bruise  DISCHARGE CONDITION: Stable  DISPOSITION: Home  DISCHARGE INSTRUCTIONS:    Activity:  As tolerated   Diet recommendation: Low fat diet      Discharge Instructions   Call MD for:  persistant nausea and vomiting    Complete by:  As directed      Call MD for:  severe uncontrolled pain    Complete by:  As directed      Diet - low sodium heart healthy    Complete by:  As directed      Increase activity slowly    Complete by:  As directed            Follow-up Information   Follow up with Georgiana Spinner, MD. Schedule an appointment as soon as possible for a visit in 1 week.   Specialty:  Family Medicine   Contact information:   [address], [place] [address] [phone]       Follow up with Landry Dyke, MD. Schedule an appointment as soon as possible for a visit in 1 week.   Specialty:  Gastroenterology   Contact information:   [address], [place] [address] [phone]      Total Time spent on discharge equals 45 minutes.  SignedOren Binet 02/05/2014 6:08 PM  **Disclaimer: This note may have been dictated with voice recognition software. Similar sounding words can inadvertently be transcribed and this note may contain transcription errors which may not have been corrected  upon publication of note.**

## 2014-02-05 NOTE — Progress Notes (Addendum)
PATIENT DETAILS Name: Alexander Schmitt Age: 63 y.o. Sex: male Date of Birth: 08/18/1950 Admit Date: 02/03/2014 Admitting Physician Evalee Mutton Kristeen Mans, MD [id number], Andrey Spearman, MD  Subjective: No further abdominal pain. Tolerated Full liquids this am for breakfast, anxious about going home.  Assessment/Plan: Active Problems: Acute on chronic Alcoholic pancreatitis - Patient was admitted, given supportive care with IV fluids, as needed narcotics and kept n.p.o.Significantly and rapidly improved, diet advanced to full liquids which he has tolerated well, will advance to low fat diet for lunch, if he tolerates that-then can be discharged. I have counseled him extensively regarding alcohol abstinence.On exam no further epigastric tenderness.   History of Pancreatic pseudocyst  - Patient does have a history of known pseudocyst, patient underwent a endoscopic ultrasound in January 2015 which showed a small pseudocyst approximately 17x20 mm in size. We'll continue to closely monitor, and repeat imaging as needed-suspect can be pursued in the outpatient setting. I have counseled him extensively regarding alcohol abstinence.  Cirrhosis  - Suspect alcoholic etiology. Currently without any evidence of decompensation.   History of alcohol use  - Claims lasting 4 days prior to admission - No signs of withdrawal - Placed on Ativan per [facility] protocol-since no signs of withdrawal-will d/c  Severe malnutrition - Started on supplements-continue  Disposition: Remain inpatient-home later today if tolerates diet.  DVT Prophylaxis: Prophylactic Lovenox   Code Status: Full code  Family Communication None at bedside this am  Procedures:  None  CONSULTS:  None  MEDICATIONS: Scheduled Meds: . enoxaparin (LOVENOX) injection  40 mg Subcutaneous Q24H  . feeding supplement (RESOURCE BREEZE)  1 Container Oral TID BM  . folic acid  1 mg Oral Daily  . lipase/protease/amylase  12,000 Units Oral  TID WC  . multivitamin with minerals  1 tablet Oral Daily  . pantoprazole (PROTONIX) IV  40 mg Intravenous Q24H  . polyethylene glycol  17 g Oral Daily  . senna-docusate  2 tablet Oral QHS  . thiamine  100 mg Oral Daily   Or  . thiamine  100 mg Intravenous Daily   Continuous Infusions: . dextrose 5 % and 0.9% NaCl 75 mL/hr at 02/05/14 0223   PRN Meds:.acetaminophen, acetaminophen, albuterol, alum & mag hydroxide-simeth, guaiFENesin-dextromethorphan, LORazepam, LORazepam, magnesium hydroxide, morphine injection, ondansetron (ZOFRAN) IV, ondansetron, oxyCODONE, sodium phosphate  Antibiotics: Anti-infectives   None       PHYSICAL EXAM: Vital signs in last 24 hours: Filed Vitals:   02/04/14 1800 02/04/14 2130 02/04/14 2354 02/05/14 0533  BP: 125/64 113/65 129/73 156/84  Pulse: 64 71 63 62  Temp:  98.2 F (36.8 C)  98.3 F (36.8 C)  TempSrc:  Oral  Oral  Resp:  18  17  Height:      Weight:      SpO2:  98%  98%    Weight change:  Filed Weights   02/03/14 1322 02/03/14 1820  Weight: 77.111 kg (170 lb) 70.444 kg (155 lb 4.8 oz)   Body mass index is 21.06 kg/(m^2).   Gen Exam: Awake and alert with clear speech.   Neck: Supple, No JVD.   Chest: B/L Clear.   CVS: S1 S2 Regular, no murmurs.  Abdomen: soft, BS +, Non tender, non distended.  Extremities: no edema, lower extremities warm to touch. Neurologic: Non Focal.   Skin: No Rash.   Wounds: N/A.   Intake/Output from previous day:  Intake/Output Summary (Last 24 hours) at 02/05/14 5621 Last data  filed at 02/05/14 0534  Gross per 24 hour  Intake 1537.08 ml  Output    350 ml  Net 1187.08 ml     LAB RESULTS: CBC  Recent Labs Lab 02/03/14 1340 02/04/14 0553  WBC 9.9 6.3  HGB 14.7 11.8*  HCT 44.5 37.1*  PLT 292 201  MCV 96.5 100.0  MCH 31.9 31.8  MCHC 33.0 31.8  RDW 14.8 14.8  LYMPHSABS 2.7  --   MONOABS 0.7  --   EOSABS 0.1  --   BASOSABS 0.0  --     Chemistries   Recent Labs Lab  02/03/14 1340 02/04/14 0553  NA 140 139  K 3.8 3.6*  CL 99 103  CO2 26 29  GLUCOSE 140* 115*  BUN 8 11  CREATININE 0.80 1.00  CALCIUM 9.5 8.5    CBG: No results found for this basename: GLUCAP,  in the last 168 hours  GFR Estimated Creatinine Clearance: 76.3 ml/min (by C-G formula based on Cr of 1).  Coagulation profile No results found for this basename: INR, PROTIME,  in the last 168 hours  Cardiac Enzymes No results found for this basename: CK, CKMB, TROPONINI, MYOGLOBIN,  in the last 168 hours  No components found with this basename: POCBNP,  No results found for this basename: DDIMER,  in the last 72 hours No results found for this basename: HGBA1C,  in the last 72 hours No results found for this basename: CHOL, HDL, LDLCALC, TRIG, CHOLHDL, LDLDIRECT,  in the last 72 hours No results found for this basename: TSH, T4TOTAL, FREET3, T3FREE, THYROIDAB,  in the last 72 hours No results found for this basename: VITAMINB12, FOLATE, FERRITIN, TIBC, IRON, RETICCTPCT,  in the last 72 hours  Recent Labs  02/03/14 1340 02/04/14 0553  LIPASE 449* 227*    Urine Studies No results found for this basename: UACOL, UAPR, USPG, UPH, UTP, UGL, UKET, UBIL, UHGB, UNIT, UROB, ULEU, UEPI, UWBC, URBC, UBAC, CAST, CRYS, UCOM, BILUA,  in the last 72 hours  MICROBIOLOGY: No results found for this or any previous visit (from the past 240 hour(s)).  RADIOLOGY STUDIES/RESULTS: No results found.  Oren Binet, MD  [facility] Pager:[phone]  If 7PM-7AM, please contact night-coverage [email or web address] Password [email or web address] 02/05/2014, 9:06 AM   LOS: 2 days   **Disclaimer: This note may have been dictated with voice recognition software. Similar sounding words can inadvertently be transcribed and this note may contain transcription errors which may not have been corrected upon publication of note.**

## 2014-02-06 DIAGNOSIS — E440 Moderate protein-calorie malnutrition: Secondary | ICD-10-CM

## 2014-02-06 NOTE — Care Management Note (Signed)
    Page 1 of 1   02/06/2014     12:03:24 PM CARE MANAGEMENT NOTE 02/06/2014  Patient:  Alexander Schmitt, Alexander Schmitt   Account Number:  [id number]  Date Initiated:  02/06/2014  Documentation initiated by:  Tomi Bamberger  Subjective/Objective Assessment:   dx chest pain  stomach pain  admit- lives with sign other. pta indep.     Action/Plan:   Anticipated DC Date:  02/06/2014   Anticipated DC Plan:  HOME/SELF CARE      DC Planning Services  CM consult      Choice offered to / List presented to:             Status of service:  Completed, signed off Medicare Important Message given?  NO (If response is "NO", the following Medicare IM given date fields will be blank) Date Medicare IM given:   Medicare IM given by:   Date Additional Medicare IM given:   Additional Medicare IM given by:    Discharge Disposition:  HOME/SELF CARE  Per UR Regulation:  Reviewed for med. necessity/level of care/duration of stay  If discussed at [place] of Stay Meetings, dates discussed:    Comments:

## 2014-02-06 NOTE — Progress Notes (Signed)
NURSING PROGRESS NOTE  Alexander Schmitt [id number] Discharge Data: 02/06/2014 11:02 AM Attending Provider: No att. providers found LID:[id number], Andrey Spearman, MD     [name] to be D/C'd Home per MD order.  Discussed with the patient the After Visit Summary and all questions fully answered. All IV's discontinued with no bleeding noted. All belongings returned to patient for patient to take home.   Last Vital Signs:  Blood pressure 149/82, pulse 60, temperature 98.2 F (36.8 C), temperature source Oral, resp. rate 16, height 6' (1.829 m), weight 70.444 kg (155 lb 4.8 oz), SpO2 98.00%.  Discharge Medication List   Medication List         ALKA SELTZER PLUS PO  Take 2 capsules by mouth daily as needed (for cold).     feeding supplement (RESOURCE BREEZE) Liqd  Take 1 Container by mouth 3 (three) times daily between meals.     folic acid 1 MG tablet  Commonly known as:  FOLVITE  Take 1 tablet (1 mg total) by mouth daily.     lipase/protease/amylase 12000 UNITS Cpep capsule  Commonly known as:  CREON  Take 1 capsule by mouth 3 (three) times daily with meals.     magnesium citrate Soln  Take 0.5 Bottles by mouth daily as needed for moderate constipation or severe constipation.     magnesium hydroxide 400 MG/5ML suspension  Commonly known as:  MILK OF MAGNESIA  Take 30 mLs by mouth daily as needed for mild constipation or moderate constipation.     ondansetron 4 MG tablet  Commonly known as:  ZOFRAN  Take 1 tablet (4 mg total) by mouth every 6 (six) hours as needed for nausea.     oxyCODONE 5 MG immediate release tablet  Commonly known as:  Oxy IR/ROXICODONE  Take 1 tablet (5 mg total) by mouth every 4 (four) hours as needed for moderate pain.     pantoprazole 40 MG tablet  Commonly known as:  PROTONIX  Take 1 tablet (40 mg total) by mouth daily at 8 pm.     polyethylene glycol packet  Commonly known as:  MIRALAX / GLYCOLAX  Take 17 g by mouth daily.     thiamine 100 MG  tablet  Take 1 tablet (100 mg total) by mouth daily.

## 2014-02-13 ENCOUNTER — Other Ambulatory Visit: Payer: Self-pay | Admitting: Gastroenterology

## 2014-02-13 DIAGNOSIS — K7469 Other cirrhosis of liver: Secondary | ICD-10-CM

## 2014-02-20 ENCOUNTER — Ambulatory Visit
Admission: RE | Admit: 2014-02-20 | Discharge: 2014-02-20 | Disposition: A | Discharge status: Home / Self Care | Payer: Medicaid Other | Source: Ambulatory Visit | Attending: Gastroenterology | Admitting: Gastroenterology

## 2014-02-20 DIAGNOSIS — K7469 Other cirrhosis of liver: Secondary | ICD-10-CM

## 2014-03-28 ENCOUNTER — Emergency Department (HOSPITAL_COMMUNITY): Payer: Medicaid Other

## 2014-03-28 ENCOUNTER — Emergency Department (HOSPITAL_COMMUNITY)
Admission: EM | Admit: 2014-03-28 | Discharge: 2014-03-28 | Disposition: A | Discharge status: Home / Self Care | Payer: Medicaid Other | Attending: Emergency Medicine | Admitting: Emergency Medicine

## 2014-03-28 ENCOUNTER — Encounter (HOSPITAL_COMMUNITY): Payer: Self-pay | Admitting: Emergency Medicine

## 2014-03-28 DIAGNOSIS — F17200 Nicotine dependence, unspecified, uncomplicated: Secondary | ICD-10-CM | POA: Diagnosis not present

## 2014-03-28 DIAGNOSIS — M7989 Other specified soft tissue disorders: Secondary | ICD-10-CM | POA: Diagnosis present

## 2014-03-28 DIAGNOSIS — Z8739 Personal history of other diseases of the musculoskeletal system and connective tissue: Secondary | ICD-10-CM | POA: Insufficient documentation

## 2014-03-28 DIAGNOSIS — Z79899 Other long term (current) drug therapy: Secondary | ICD-10-CM | POA: Insufficient documentation

## 2014-03-28 DIAGNOSIS — K219 Gastro-esophageal reflux disease without esophagitis: Secondary | ICD-10-CM | POA: Insufficient documentation

## 2014-03-28 DIAGNOSIS — M25469 Effusion, unspecified knee: Secondary | ICD-10-CM | POA: Diagnosis not present

## 2014-03-28 DIAGNOSIS — M25461 Effusion, right knee: Secondary | ICD-10-CM

## 2014-03-28 MED ORDER — NAPROXEN 250 MG PO TABS
375.0000 mg | ORAL_TABLET | Freq: Once | ORAL | Status: AC
  Administered 2014-03-28: 375 mg via ORAL
  Filled 2014-03-28: qty 2

## 2014-03-28 MED ORDER — NAPROXEN 375 MG PO TABS: 375.0000 mg | ORAL_TABLET | Freq: Three times a day (TID) | ORAL | Status: DC

## 2014-03-28 NOTE — ED Notes (Signed)
Called x-ray to see how soon patient will be picked up, will be transporting soon.

## 2014-03-28 NOTE — ED Notes (Signed)
Patient transported to X-ray 

## 2014-03-28 NOTE — Discharge Instructions (Signed)
Knee Effusion  Knee effusion means you have fluid in your knee. The knee may be more difficult to bend and move. HOME CARE  Use crutches or a brace as told by your doctor.  Put ice on the injured area.  Put ice in a plastic bag.  Place a towel between your skin and the bag.  Leave the ice on for 15-20 minutes, 03-04 times a day.  Raise (elevate) your knee as much as possible.  Only take medicine as told by your doctor.  You may need to do strengthening exercises. Ask your doctor.  Continue with your normal diet and activities as told by your doctor. GET HELP RIGHT AWAY IF:  You have more puffiness (swelling) in your knee.  You see redness, puffiness, or have more pain in your knee.  You have a temperature by mouth above 102 F (38.9 C).  You get a rash.  You have trouble breathing.  You have a reaction to any medicine you are taking.  You have a lot of pain when you move your knee. MAKE SURE YOU:  Understand these instructions.  Will watch your condition.  Will get help right away if you are not doing well or get worse. Document Released: 07/26/2010 Document Revised: 09/15/2011 Document Reviewed: 07/26/2010 [facility] Patient Information 2015 [place], [place]. This information is not intended to replace advice given to you by your health care provider. Make sure you discuss any questions you have with your health care provider.  Heat Therapy Heat therapy can help make painful, stiff muscles and joints feel better. Do not use heat on new injuries. Wait at least 48 hours after an injury to use heat. Do not use heat when you have aches or pains right after an activity. If you still have pain 3 hours after stopping the activity, then you may use heat. HOME CARE Wet heat pack  Soak a clean towel in warm water. Squeeze out the extra water.  Put the warm, wet towel in a plastic bag.  Place a thin, dry towel between your skin and the bag.  Put the heat pack on the area  for 5 minutes, and check your skin. Your skin may be pink, but it should not be red.  Leave the heat pack on the area for 15 to 30 minutes.  Repeat this every 2 to 4 hours while awake. Do not use heat while you are sleeping. Warm water bath  Fill a tub with warm water.  Place the affected body part in the tub.  Soak the area for 20 to 40 minutes.  Repeat as needed. Hot water bottle  Fill the water bottle half full with hot water.  Press out the extra air. Close the cap tightly.  Place a dry towel between your skin and the bottle.  Put the bottle on the area for 5 minutes, and check your skin. Your skin may be pink, but it should not be red.  Leave the bottle on the area for 15 to 30 minutes.  Repeat this every 2 to 4 hours while awake. Electric heating pad  Place a dry towel between your skin and the heating pad.  Set the heating pad on low heat.  Put the heating pad on the area for 10 minutes, and check your skin. Your skin may be pink, but it should not be red.  Leave the heating pad on the area for 20 to 40 minutes.  Repeat this every 2 to 4 hours while  awake.  Do not lie on the heating pad.  Do not fall asleep while using the heating pad.  Do not use the heating pad near water. GET HELP RIGHT AWAY IF:  You get blisters or red skin.  Your skin is puffy (swollen), or you lose feeling (numbness) in the affected area.  You have any new problems.  Your problems are getting worse.  You have any questions or concerns. If you have any problems, stop using heat therapy until you see your doctor. MAKE SURE YOU:  Understand these instructions.  Will watch your condition.  Will get help right away if you are not doing well or get worse. Document Released: 09/15/2011 Document Reviewed: 08/16/2013 [facility] Patient Information 2015 [place]. This information is not intended to replace advice given to you by your health care provider. Make sure you discuss  any questions you have with your health care provider. Follow up with your PCP

## 2014-03-28 NOTE — ED Provider Notes (Signed)
CSN: [id number]     Arrival date & time 03/28/14  1813 History   First MD Initiated Contact with Patient 03/28/14 1949     Chief Complaint  Patient presents with  . Leg Swelling     (Consider location/radiation/quality/duration/timing/severity/associated sxs/prior Treatment) HPI Comments: Reports 2 weeks of R knee pain and swelling does not recall a specific injury or fall   The history is provided by the patient.    Past Medical History  Diagnosis Date  . Intestine disorder     intestinal blockage  . History of stomach ulcers   . Pancreatitis   . GERD (gastroesophageal reflux disease)     occasional   . Arthritis     left shoulder   . Cirrhosis     hx of    Past Surgical History  Procedure Laterality Date  . Inguinal hernia repair Right 09/29/2012    Procedure: HERNIA REPAIR INGUINAL ADULT;  Surgeon: Imogene Burn. Tsuei, MD;  Location: [facility];  Service: General;  Laterality: Right;  . Umbilical hernia repair N/A 09/29/2012    Procedure: HERNIA REPAIR UMBILICAL ADULT;  Surgeon: Imogene Burn. Georgette Dover, MD;  Location: [facility];  Service: General;  Laterality: N/A;  . Insertion of mesh Right 09/29/2012    Procedure: INSERTION OF MESH;  Surgeon: Imogene Burn. Georgette Dover, MD;  Location: [facility];  Service: General;  Laterality: Right;  . Eus N/A 07/13/2013    Procedure: ESOPHAGEAL ENDOSCOPIC ULTRASOUND (EUS) RADIAL;  Surgeon: Arta Silence, MD;  Location: [facility];  Service: Endoscopy;  Laterality: N/A;   Family History  Problem Relation Age of Onset  . Hypertension Father    History  Substance Use Topics  . Smoking status: Light Tobacco Smoker -- 0.25 packs/day for 40 years    Types: Cigarettes  . Smokeless tobacco: Never Used  . Alcohol Use: Yes     Comment: states stopped 3 months ago    Review of Systems  Constitutional: Negative for fever.  Musculoskeletal: Positive for joint swelling.  Skin: Negative for wound.  All other systems reviewed and are negative.     Allergies   Review of patient's allergies indicates no known allergies.  Home Medications   Prior to Admission medications   Medication Sig Start Date End Date Taking? Authorizing Provider  feeding supplement, RESOURCE BREEZE, (RESOURCE BREEZE) LIQD Take 1 Container by mouth 3 (three) times daily between meals. 02/05/14   Shanker Kristeen Mans, MD  folic acid (FOLVITE) 1 MG tablet Take 1 tablet (1 mg total) by mouth daily. 02/05/14   Shanker Kristeen Mans, MD  lipase/protease/amylase (CREON-12/PANCREASE) 12000 UNITS CPEP capsule Take 1 capsule by mouth 3 (three) times daily with meals.    Historical Provider, MD  magnesium citrate SOLN Take 0.5 Bottles by mouth daily as needed for moderate constipation or severe constipation.    Historical Provider, MD  magnesium hydroxide (MILK OF MAGNESIA) 400 MG/5ML suspension Take 30 mLs by mouth daily as needed for mild constipation or moderate constipation.    Historical Provider, MD  naproxen (NAPROSYN) 375 MG tablet Take 1 tablet (375 mg total) by mouth 3 (three) times daily with meals. 03/28/14   Garald Balding, NP  ondansetron (ZOFRAN) 4 MG tablet Take 1 tablet (4 mg total) by mouth every 6 (six) hours as needed for nausea. 02/05/14   Shanker Kristeen Mans, MD  oxyCODONE (OXY IR/ROXICODONE) 5 MG immediate release tablet Take 1 tablet (5 mg total) by mouth every 4 (four) hours as needed for moderate pain.  02/05/14   Shanker Kristeen Mans, MD  pantoprazole (PROTONIX) 40 MG tablet Take 1 tablet (40 mg total) by mouth daily at 8 pm. 02/05/14   Jonetta Osgood, MD  Phenyleph-Doxylamine-DM-APAP (ALKA SELTZER PLUS PO) Take 2 capsules by mouth daily as needed (for cold).    Historical Provider, MD  polyethylene glycol (MIRALAX / GLYCOLAX) packet Take 17 g by mouth daily. 02/05/14   Shanker Kristeen Mans, MD  thiamine 100 MG tablet Take 1 tablet (100 mg total) by mouth daily. 02/05/14   Shanker Kristeen Mans, MD   BP 107/70  Pulse 93  Temp(Src) 97.8 F (36.6 C) (Oral)  Resp 18  SpO2 95% Physical Exam   Nursing note and vitals reviewed. Constitutional: He appears well-developed and well-nourished.  HENT:  Head: Normocephalic.  Eyes: Pupils are equal, round, and reactive to light.  Cardiovascular: Normal rate.   Pulmonary/Chest: Effort normal.  Musculoskeletal: He exhibits tenderness. He exhibits no edema.       Right knee: He exhibits swelling. He exhibits normal range of motion, no effusion, no ecchymosis, no deformity and no erythema.  Neurological: He is alert.  Skin: No erythema.    ED Course  Procedures (including critical care time) Labs Review Labs Reviewed - No data to display  Imaging Review Dg Knee Complete 4 Views Right  03/28/2014   CLINICAL DATA:  Right knee pain for 1-1/2 weeks with no injury, swelling  EXAM: RIGHT KNEE - COMPLETE 4+ VIEW  COMPARISON:  None.  FINDINGS: Small to moderate joint effusion. No acute fracture. There is a below the knee vascular calcification.  Ossification of the proximal aspect of the medial collateral ligament.  IMPRESSION: No acute osseous abnormalities.  Small to moderate joint effusion.   Electronically Signed   By: Skipper Cliche M.D.   On: 03/28/2014 21:54     EKG Interpretation None      MDM   Final diagnoses:  Knee effusion, right     Xray show effusion     Garald Balding, NP 03/28/14 2223

## 2014-03-28 NOTE — ED Notes (Signed)
Knee sleeve applied to right knee

## 2014-03-28 NOTE — ED Notes (Signed)
Pt reports right leg swelling mostly in his knee for 2 weeks. Is able to bear wt. No injury to leg. Skin is appropriately warm, no areas of redness noted.

## 2014-03-30 NOTE — ED Provider Notes (Signed)
Medical screening examination/treatment/procedure(s) were performed by non-physician practitioner and as supervising physician I was immediately available for consultation/collaboration.     Veryl Speak, MD 03/30/14 [phone]

## 2014-05-11 ENCOUNTER — Encounter (HOSPITAL_COMMUNITY): Payer: Self-pay | Admitting: *Deleted

## 2014-05-24 ENCOUNTER — Ambulatory Visit (HOSPITAL_COMMUNITY): Admission: RE | Admit: 2014-05-24 | Payer: Medicaid Other | Source: Ambulatory Visit | Admitting: Gastroenterology

## 2014-05-24 SURGERY — ESOPHAGOGASTRODUODENOSCOPY (EGD) WITH PROPOFOL
Anesthesia: Monitor Anesthesia Care

## 2014-06-06 ENCOUNTER — Emergency Department (HOSPITAL_COMMUNITY): Payer: Medicaid Other

## 2014-06-06 ENCOUNTER — Encounter (HOSPITAL_COMMUNITY): Payer: Self-pay | Admitting: *Deleted

## 2014-06-06 ENCOUNTER — Emergency Department (HOSPITAL_COMMUNITY)
Admission: EM | Admit: 2014-06-06 | Discharge: 2014-06-06 | Disposition: A | Discharge status: Home / Self Care | Payer: Medicaid Other | Attending: Emergency Medicine | Admitting: Emergency Medicine

## 2014-06-06 DIAGNOSIS — R52 Pain, unspecified: Secondary | ICD-10-CM

## 2014-06-06 DIAGNOSIS — Z79899 Other long term (current) drug therapy: Secondary | ICD-10-CM | POA: Insufficient documentation

## 2014-06-06 DIAGNOSIS — Z720 Tobacco use: Secondary | ICD-10-CM | POA: Diagnosis not present

## 2014-06-06 DIAGNOSIS — K219 Gastro-esophageal reflux disease without esophagitis: Secondary | ICD-10-CM | POA: Insufficient documentation

## 2014-06-06 DIAGNOSIS — Z791 Long term (current) use of non-steroidal anti-inflammatories (NSAID): Secondary | ICD-10-CM | POA: Insufficient documentation

## 2014-06-06 DIAGNOSIS — M1990 Unspecified osteoarthritis, unspecified site: Secondary | ICD-10-CM | POA: Insufficient documentation

## 2014-06-06 DIAGNOSIS — M25561 Pain in right knee: Secondary | ICD-10-CM | POA: Diagnosis present

## 2014-06-06 MED ORDER — TRAMADOL HCL 50 MG PO TABS: 50.0000 mg | ORAL_TABLET | Freq: Four times a day (QID) | ORAL | Status: DC | PRN

## 2014-06-06 NOTE — Discharge Instructions (Signed)
Take tramadol as needed for pain. Follow up with Dr. Percell Miller for further evaluation of your knee pain. Refer to attached documents for more information.

## 2014-06-06 NOTE — ED Notes (Signed)
Pt states that he has rt knee pain that has been ongoing for 2 months. Pt states that he has been seen and treated for this in the past and given pain medication. Pt states that it is not working. No tenderness to palpation just with movement.

## 2014-06-06 NOTE — ED Provider Notes (Signed)
CSN: [id number]     Arrival date & time 06/06/14  8315 History  This chart was scribed for non-physician practitioner working with Janice Norrie, MD by Molli Posey, ED Scribe. This patient was seen in room TR06C/TR06C and the patient's care was started at 9:22 AM.    Chief Complaint  Patient presents with  . Knee Pain   The history is provided by the patient. No language interpreter was used.   HPI Comments: Alexander Schmitt is a 63 y.o. male who presents to the Emergency Department complaining of right knee pain for the past 2 months. He says that he has been seen and treated for this in the past and has been prescribed pain medication but says it has not provided relief to his pain. Pt denies any new injuries. He states the pain is worse with certain movements. Pt wants a referral to an orthopedist. He reports no other symptoms at this time. He reports NKDA.    Past Medical History  Diagnosis Date  . Intestine disorder     intestinal blockage  . History of stomach ulcers   . Pancreatitis   . GERD (gastroesophageal reflux disease)     occasional   . Arthritis     left shoulder   . Cirrhosis     hx of   . Swelling of joint of right knee     05-11-14 has informed DR., awaitng for instructions-denies warmth, has intrmittent pain or discomfort   Past Surgical History  Procedure Laterality Date  . Inguinal hernia repair Right 09/29/2012    Procedure: HERNIA REPAIR INGUINAL ADULT;  Surgeon: Imogene Burn. Tsuei, MD;  Location: [facility];  Service: General;  Laterality: Right;  . Umbilical hernia repair N/A 09/29/2012    Procedure: HERNIA REPAIR UMBILICAL ADULT;  Surgeon: Imogene Burn. Georgette Dover, MD;  Location: [facility];  Service: General;  Laterality: N/A;  . Insertion of mesh Right 09/29/2012    Procedure: INSERTION OF MESH;  Surgeon: Imogene Burn. Georgette Dover, MD;  Location: [facility];  Service: General;  Laterality: Right;  . Eus N/A 07/13/2013    Procedure: ESOPHAGEAL ENDOSCOPIC ULTRASOUND (EUS) RADIAL;  Surgeon:  Arta Silence, MD;  Location: [facility];  Service: Endoscopy;  Laterality: N/A;   Family History  Problem Relation Age of Onset  . Hypertension Father    History  Substance Use Topics  . Smoking status: Light Tobacco Smoker -- 0.25 packs/day for 40 years    Types: Cigarettes  . Smokeless tobacco: Never Used     Comment: 2-3 cigarettes per day  . Alcohol Use: Yes     Comment: states stopped 3 months ago, none since    Review of Systems  Constitutional: Negative for fever.  Musculoskeletal: Positive for arthralgias.  All other systems reviewed and are negative.     Allergies  Review of patient's allergies indicates no known allergies.  Home Medications   Prior to Admission medications   Medication Sig Start Date End Date Taking? Authorizing Provider  feeding supplement, RESOURCE BREEZE, (RESOURCE BREEZE) LIQD Take 1 Container by mouth 3 (three) times daily between meals. 02/05/14   Shanker Kristeen Mans, MD  folic acid (FOLVITE) 1 MG tablet Take 1 tablet (1 mg total) by mouth daily. 02/05/14   Shanker Kristeen Mans, MD  lipase/protease/amylase (CREON-12/PANCREASE) 12000 UNITS CPEP capsule Take 1 capsule by mouth 3 (three) times daily with meals.    Historical Provider, MD  magnesium citrate SOLN Take 0.5 Bottles by mouth daily as needed for  moderate constipation or severe constipation.    Historical Provider, MD  magnesium hydroxide (MILK OF MAGNESIA) 400 MG/5ML suspension Take 30 mLs by mouth daily as needed for mild constipation or moderate constipation.    Historical Provider, MD  naproxen (NAPROSYN) 375 MG tablet Take 1 tablet (375 mg total) by mouth 3 (three) times daily with meals. 03/28/14   Garald Balding, NP  ondansetron (ZOFRAN) 4 MG tablet Take 1 tablet (4 mg total) by mouth every 6 (six) hours as needed for nausea. 02/05/14   Shanker Kristeen Mans, MD  oxyCODONE (OXY IR/ROXICODONE) 5 MG immediate release tablet Take 1 tablet (5 mg total) by mouth every 4 (four) hours as needed for  moderate pain. 02/05/14   Shanker Kristeen Mans, MD  pantoprazole (PROTONIX) 40 MG tablet Take 1 tablet (40 mg total) by mouth daily at 8 pm. 02/05/14   Jonetta Osgood, MD  Phenyleph-Doxylamine-DM-APAP (ALKA SELTZER PLUS PO) Take 2 capsules by mouth daily as needed (for cold).    Historical Provider, MD  polyethylene glycol (MIRALAX / GLYCOLAX) packet Take 17 g by mouth daily. Patient taking differently: Take 17 g by mouth daily as needed for mild constipation.  02/05/14   Shanker Kristeen Mans, MD  thiamine 100 MG tablet Take 1 tablet (100 mg total) by mouth daily. Patient taking differently: Take 100 mg by mouth every morning.  02/05/14   Shanker Kristeen Mans, MD   BP 119/82 mmHg  Pulse 76  Temp(Src) 97.8 F (36.6 C) (Oral)  Resp 18  Ht 6' (1.829 m)  Wt 165 lb (74.844 kg)  BMI 22.37 kg/m2  SpO2 99% Physical Exam  Constitutional: He is oriented to person, place, and time. He appears well-developed and well-nourished.  HENT:  Head: Normocephalic and atraumatic.  Eyes: EOM are normal.  Neck: Normal range of motion. Neck supple. No tracheal deviation present.  Cardiovascular: Normal rate and intact distal pulses.   Pulmonary/Chest: Effort normal.  Abdominal: He exhibits no distension.  Musculoskeletal:  Mild generalized edema of right knee, full ROM. No deformities. Popliteal tenderness to palpation. No calf TTP.   Neurological: He is alert and oriented to person, place, and time.  Skin: Skin is warm and dry.  Psychiatric: He has a normal mood and affect. His behavior is normal.  Nursing note and vitals reviewed.   ED Course  Procedures  DIAGNOSTIC STUDIES: Oxygen Saturation is 99% on RA, normal by my interpretation.    COORDINATION OF CARE: 9:26 AM Discussed treatment plan with pt at bedside and pt agreed to plan.   Labs Review Labs Reviewed - No data to display  Imaging Review Dg Knee Complete 4 Views Right  06/06/2014   CLINICAL DATA:  Posterior right knee pain post twisting the knee  yesterday  EXAM: RIGHT KNEE - COMPLETE 4+ VIEW  COMPARISON:  03/28/2014  FINDINGS: Four views of the right knee submitted. No acute fracture or subluxation. There is spurring of medial femoral condyle. Moderate joint effusion. No acute fracture or subluxation. Mild narrowing of patellofemoral joint space.  IMPRESSION: No acute fracture or subluxation. Moderate joint effusion. Spurring of medial femoral condyle. Mild narrowing of patellofemoral joint space.   Electronically Signed   By: Lahoma Crocker M.D.   On: 06/06/2014 09:14     EKG Interpretation None      MDM   Final diagnoses:  Right knee pain    9:27 AM Patient's knee xray unremarkable for acute changes. Patient will have Orthopedic referral and pain medication.  No neurovascular compromise. No new injury.   I personally performed the services described in this documentation, which was scribed in my presence. The recorded information has been reviewed and is accurate.      Alvina Chou, PA-C 06/06/14 John Day, MD 06/06/14 [phone]

## 2014-09-22 ENCOUNTER — Other Ambulatory Visit: Payer: Self-pay | Admitting: Gastroenterology

## 2014-09-22 DIAGNOSIS — K7030 Alcoholic cirrhosis of liver without ascites: Secondary | ICD-10-CM

## 2014-09-29 ENCOUNTER — Other Ambulatory Visit: Payer: Medicaid Other

## 2014-10-06 ENCOUNTER — Other Ambulatory Visit: Payer: Medicaid Other

## 2015-01-02 ENCOUNTER — Ambulatory Visit
Admission: RE | Admit: 2015-01-02 | Discharge: 2015-01-02 | Disposition: A | Discharge status: Home / Self Care | Payer: Medicaid Other | Source: Ambulatory Visit | Attending: Gastroenterology | Admitting: Gastroenterology

## 2015-01-02 DIAGNOSIS — K7030 Alcoholic cirrhosis of liver without ascites: Secondary | ICD-10-CM

## 2015-01-22 ENCOUNTER — Encounter (HOSPITAL_COMMUNITY): Payer: Self-pay | Admitting: *Deleted

## 2015-01-22 ENCOUNTER — Emergency Department (HOSPITAL_COMMUNITY)
Admission: EM | Admit: 2015-01-22 | Discharge: 2015-01-22 | Disposition: A | Discharge status: Home / Self Care | Payer: Medicaid Other | Attending: Emergency Medicine | Admitting: Emergency Medicine

## 2015-01-22 DIAGNOSIS — Z79899 Other long term (current) drug therapy: Secondary | ICD-10-CM | POA: Diagnosis not present

## 2015-01-22 DIAGNOSIS — R112 Nausea with vomiting, unspecified: Secondary | ICD-10-CM

## 2015-01-22 DIAGNOSIS — R109 Unspecified abdominal pain: Secondary | ICD-10-CM | POA: Diagnosis present

## 2015-01-22 DIAGNOSIS — Z8739 Personal history of other diseases of the musculoskeletal system and connective tissue: Secondary | ICD-10-CM | POA: Insufficient documentation

## 2015-01-22 DIAGNOSIS — K2970 Gastritis, unspecified, without bleeding: Secondary | ICD-10-CM | POA: Diagnosis not present

## 2015-01-22 DIAGNOSIS — Z720 Tobacco use: Secondary | ICD-10-CM | POA: Diagnosis not present

## 2015-01-22 DIAGNOSIS — R1013 Epigastric pain: Secondary | ICD-10-CM

## 2015-01-22 LAB — COMPREHENSIVE METABOLIC PANEL
ALK PHOS: 79 U/L (ref 38–126)
ALT: 15 U/L — AB (ref 17–63)
AST: 23 U/L (ref 15–41)
Albumin: 3.6 g/dL (ref 3.5–5.0)
Anion gap: 11 (ref 5–15)
BUN: 5 mg/dL — ABNORMAL LOW (ref 6–20)
CHLORIDE: 99 mmol/L — AB (ref 101–111)
CO2: 29 mmol/L (ref 22–32)
CREATININE: 1.05 mg/dL (ref 0.61–1.24)
Calcium: 9.6 mg/dL (ref 8.9–10.3)
GFR calc non Af Amer: >60 mL/min (ref >60)
Glucose, Bld: 115 mg/dL — ABNORMAL HIGH (ref 65–99)
POTASSIUM: 4.7 mmol/L (ref 3.5–5.1)
SODIUM: 139 mmol/L (ref 135–145)
TOTAL PROTEIN: 6.9 g/dL (ref 6.5–8.1)
Total Bilirubin: 0.5 mg/dL (ref 0.3–1.2)

## 2015-01-22 LAB — CBC
HCT: 44.8 % (ref 39.0–52.0)
Hemoglobin: 14.7 g/dL (ref 13.0–17.0)
MCH: 30.4 pg (ref 26.0–34.0)
MCHC: 32.8 g/dL (ref 30.0–36.0)
MCV: 92.8 fL (ref 78.0–100.0)
Platelets: 405 10*3/uL — ABNORMAL HIGH (ref 150–400)
RBC: 4.83 MIL/uL (ref 4.22–5.81)
RDW: 14.4 % (ref 11.5–15.5)
WBC: 5.7 10*3/uL (ref 4.0–10.5)

## 2015-01-22 LAB — LIPASE, BLOOD: Lipase: 60 U/L — ABNORMAL HIGH (ref 22–51)

## 2015-01-22 LAB — I-STAT TROPONIN, ED: TROPONIN I, POC: 0 ng/mL (ref 0.00–0.08)

## 2015-01-22 MED ORDER — GI COCKTAIL ~~LOC~~
30.0000 mL | Freq: Once | ORAL | Status: AC
  Administered 2015-01-22: 30 mL via ORAL
  Filled 2015-01-22: qty 30

## 2015-01-22 MED ORDER — HYDROMORPHONE HCL 1 MG/ML IJ SOLN
1.0000 mg | Freq: Once | INTRAMUSCULAR | Status: AC
  Administered 2015-01-22: 1 mg via INTRAVENOUS
  Filled 2015-01-22: qty 1

## 2015-01-22 MED ORDER — ONDANSETRON HCL 4 MG/2ML IJ SOLN
4.0000 mg | Freq: Once | INTRAMUSCULAR | Status: AC
  Administered 2015-01-22: 4 mg via INTRAVENOUS
  Filled 2015-01-22: qty 2

## 2015-01-22 MED ORDER — SODIUM CHLORIDE 0.9 % IV BOLUS (SEPSIS)
1000.0000 mL | Freq: Once | INTRAVENOUS | Status: AC
  Administered 2015-01-22: 1000 mL via INTRAVENOUS

## 2015-01-22 MED ORDER — PANTOPRAZOLE SODIUM 40 MG PO TBEC: 40.0000 mg | DELAYED_RELEASE_TABLET | Freq: Every day | ORAL | Status: DC

## 2015-01-22 NOTE — ED Provider Notes (Signed)
CSN: [id number]     Arrival date & time 01/22/15  1515 History   First MD Initiated Contact with Patient 01/22/15 1614     Chief Complaint  Patient presents with  . Abdominal Pain     (Consider location/radiation/quality/duration/timing/severity/associated sxs/prior Treatment) HPI Alexander Schmitt is a 64 year old male past medical history of gastric ulcers, pancreatitis, pancreatic cyst, cirrhosis who presents the ER complaining of upper abdominal pain. Patient reports his pain began gradually approximately one week ago. Patient states that his tracing in upper abdominal burning sensation which is made worse with eating, and lying flat. Patient states he has not had any alleviating factors. Patient reports mild nausea with one episode of nonbilious, nonbloody emesis today. Patient denies associated chest pain, shortness of breath, headache, blurred vision, dizziness, weakness, palpitations, diarrhea, dysuria.  Past Medical History  Diagnosis Date  . Intestine disorder     intestinal blockage  . History of stomach ulcers   . Pancreatitis   . GERD (gastroesophageal reflux disease)     occasional   . Arthritis     left shoulder   . Cirrhosis     hx of   . Swelling of joint of right knee     05-11-14 has informed DR., awaitng for instructions-denies warmth, has intrmittent pain or discomfort   Past Surgical History  Procedure Laterality Date  . Inguinal hernia repair Right 09/29/2012    Procedure: HERNIA REPAIR INGUINAL ADULT;  Surgeon: Imogene Burn. Tsuei, MD;  Location: [facility];  Service: General;  Laterality: Right;  . Umbilical hernia repair N/A 09/29/2012    Procedure: HERNIA REPAIR UMBILICAL ADULT;  Surgeon: Imogene Burn. Georgette Dover, MD;  Location: [facility];  Service: General;  Laterality: N/A;  . Insertion of mesh Right 09/29/2012    Procedure: INSERTION OF MESH;  Surgeon: Imogene Burn. Georgette Dover, MD;  Location: [facility];  Service: General;  Laterality: Right;  . Eus N/A 07/13/2013    Procedure: ESOPHAGEAL  ENDOSCOPIC ULTRASOUND (EUS) RADIAL;  Surgeon: Arta Silence, MD;  Location: [facility];  Service: Endoscopy;  Laterality: N/A;   Family History  Problem Relation Age of Onset  . Hypertension Father    History  Substance Use Topics  . Smoking status: Light Tobacco Smoker -- 0.25 packs/day for 40 years    Types: Cigarettes  . Smokeless tobacco: Never Used     Comment: 2-3 cigarettes per day  . Alcohol Use: Yes     Comment: states stopped 3 months ago, none since    Review of Systems  Constitutional: Negative for fever.  HENT: Negative for trouble swallowing.   Eyes: Negative for visual disturbance.  Respiratory: Negative for shortness of breath.   Cardiovascular: Negative for chest pain.  Gastrointestinal: Positive for nausea, vomiting and abdominal pain.  Genitourinary: Negative for dysuria.  Musculoskeletal: Negative for neck pain.  Skin: Negative for rash.  Neurological: Negative for dizziness, weakness and numbness.  Psychiatric/Behavioral: Negative.     Allergies  Review of patient's allergies indicates no known allergies.  Home Medications   Prior to Admission medications   Medication Sig Start Date End Date Taking? Authorizing Provider  magnesium citrate SOLN Take 1 Bottle by mouth daily as needed for moderate constipation or severe constipation.    Yes Historical Provider, MD  sodium-potassium bicarbonate (ALKA-SELTZER GOLD) TBEF dissolvable tablet Take 1 tablet by mouth daily as needed (FOR INDIGESTION).   Yes Historical Provider, MD  feeding supplement, RESOURCE BREEZE, (RESOURCE BREEZE) LIQD Take 1 Container by mouth 3 (three) times daily  between meals. Patient not taking: Reported on 01/22/2015 02/05/14   Jonetta Osgood, MD  folic acid (FOLVITE) 1 MG tablet Take 1 tablet (1 mg total) by mouth daily. Patient not taking: Reported on 01/22/2015 02/05/14   Jonetta Osgood, MD  lipase/protease/amylase (CREON-12/PANCREASE) 12000 UNITS CPEP capsule Take 1 capsule by  mouth 3 (three) times daily with meals.    Historical Provider, MD  naproxen (NAPROSYN) 375 MG tablet Take 1 tablet (375 mg total) by mouth 3 (three) times daily with meals. Patient not taking: Reported on 01/22/2015 03/28/14   Junius Creamer, NP  ondansetron (ZOFRAN) 4 MG tablet Take 1 tablet (4 mg total) by mouth every 6 (six) hours as needed for nausea. Patient not taking: Reported on 01/22/2015 02/05/14   Jonetta Osgood, MD  oxyCODONE (OXY IR/ROXICODONE) 5 MG immediate release tablet Take 1 tablet (5 mg total) by mouth every 4 (four) hours as needed for moderate pain. 02/05/14   Shanker Kristeen Mans, MD  pantoprazole (PROTONIX) 40 MG tablet Take 1 tablet (40 mg total) by mouth daily at 8 pm. Patient not taking: Reported on 01/22/2015 02/05/14   Jonetta Osgood, MD  pantoprazole (PROTONIX) 40 MG tablet Take 1 tablet (40 mg total) by mouth daily. 01/22/15   Dahlia Bailiff, PA-C  Phenyleph-Doxylamine-DM-APAP (ALKA SELTZER PLUS PO) Take 2 capsules by mouth daily as needed (for cold).    Historical Provider, MD  polyethylene glycol (MIRALAX / GLYCOLAX) packet Take 17 g by mouth daily. Patient taking differently: Take 17 g by mouth daily as needed for mild constipation.  02/05/14   Shanker Kristeen Mans, MD  thiamine 100 MG tablet Take 1 tablet (100 mg total) by mouth daily. Patient not taking: Reported on 01/22/2015 02/05/14   Jonetta Osgood, MD  traMADol (ULTRAM) 50 MG tablet Take 1 tablet (50 mg total) by mouth every 6 (six) hours as needed. 06/06/14   Kaitlyn Szekalski, PA-C   BP 109/72 mmHg  Pulse 68  Temp(Src) 98 F (36.7 C) (Oral)  Resp 19  Ht 6' (1.829 m)  Wt 146 lb 8 oz (66.452 kg)  BMI 19.86 kg/m2  SpO2 92% Physical Exam  Constitutional: He is oriented to person, place, and time. He appears well-developed and well-nourished. No distress.  HENT:  Head: Normocephalic and atraumatic.  Mouth/Throat: Oropharynx is clear and moist. No oropharyngeal exudate.  Eyes: Right eye exhibits no discharge. Left eye  exhibits no discharge. No scleral icterus.  Neck: Normal range of motion.  Cardiovascular: Normal rate, regular rhythm and normal heart sounds.   No murmur heard. Pulmonary/Chest: Effort normal and breath sounds normal. No respiratory distress.  Abdominal: Soft. Normal appearance and bowel sounds are normal. There is tenderness in the epigastric area. There is no rigidity, no guarding, no tenderness at McBurney's point and negative Murphy's sign.  Musculoskeletal: Normal range of motion. He exhibits no edema or tenderness.  Neurological: He is alert and oriented to person, place, and time. No cranial nerve deficit. Coordination normal.  Skin: Skin is warm and dry. No rash noted. He is not diaphoretic.  Psychiatric: He has a normal mood and affect.  Nursing note and vitals reviewed.   ED Course  Procedures (including critical care time) Labs Review Labs Reviewed  LIPASE, BLOOD - Abnormal; Notable for the following:    Lipase 60 (*)    All other components within normal limits  COMPREHENSIVE METABOLIC PANEL - Abnormal; Notable for the following:    Chloride 99 (*)  Glucose, Bld 115 (*)    BUN 5 (*)    ALT 15 (*)    All other components within normal limits  CBC - Abnormal; Notable for the following:    Platelets 405 (*)    All other components within normal limits  I-STAT TROPOININ, ED    Imaging Review No results found.   EKG Interpretation   Date/Time:  Monday January 22 2015 16:55:25 EDT Ventricular Rate:  69 PR Interval:  154 QRS Duration: 100 QT Interval:  396 QTC Calculation: 424 R Axis:   38 Text Interpretation:  Sinus rhythm Probable anteroseptal infarct, old  Minimal ST elevation, lateral leads No acute changes Confirmed by  Kathrynn Humble, MD, Thelma Comp [phone]) on 01/22/2015 5:17:36 PM      MDM   Final diagnoses:  Gastritis  Epigastric pain  Non-intractable vomiting with nausea, vomiting of unspecified type   Patient's signs and symptoms resolve completely after  symptomatic therapy. Likely symptoms consistent with gastritis versus stomach ulcer. Labs are unremarkable for acute pathology. lipase mildly elevated at 60, however appears to be decreased from patient's baseline. Without this being greater than 3 times normal limits, do not believe there is a current pancreatitis. With patient's symptoms resolving completely, do not believe further pancreatic pathology contributing to patient's signs and symptoms at this time. Troponin negative greater than 6 hours after onset of symptoms. No concern for ACS. Wells criteria 0 for PE. Patient is nontoxic, nonseptic appearing, in no apparent distress.  Patient's pain and other symptoms adequately managed in emergency department.  Fluid bolus given.  Labs, imaging and vitals reviewed.  Patient does not meet the SIRS or Sepsis criteria.  On repeat exam patient does not have a surgical abdomin and there are no peritoneal signs.  No indication of appendicitis, bowel obstruction, bowel perforation, cholecystitis, diverticulitis.  Patient discharged home with symptomatic treatment and given strict instructions for follow-up with their primary care physician.  I have also discussed reasons to return immediately to the ER.  Patient expresses understanding and agrees with plan.  BP 109/72 mmHg  Pulse 68  Temp(Src) 98 F (36.7 C) (Oral)  Resp 19  Ht 6' (1.829 m)  Wt 146 lb 8 oz (66.452 kg)  BMI 19.86 kg/m2  SpO2 92%  Signed,  Dahlia Bailiff, PA-C 12:54 AM  Patient seen and discussed with Dr. Varney Biles, MD     Dahlia Bailiff, PA-C 01/23/15 [place], MD 01/25/15 [phone]

## 2015-01-22 NOTE — ED Notes (Signed)
DC instructions given to the pt and family member, pt has all belongings with him at dc time, pt using a cane and cane was with him at dc time.

## 2015-01-22 NOTE — Discharge Instructions (Signed)
Abdominal Pain °Many things can cause abdominal pain. Usually, abdominal pain is not caused by a disease and will improve without treatment. It can often be observed and treated at home. Your health care provider will do a physical exam and possibly order blood tests and X-rays to help determine the seriousness of your pain. However, in many cases, more time must pass before a clear cause of the pain can be found. Before that point, your health care provider may not know if you need more testing or further treatment. °HOME CARE INSTRUCTIONS  °Monitor your abdominal pain for any changes. The following actions may help to alleviate any discomfort you are experiencing: °· Only take over-the-counter or prescription medicines as directed by your health care provider. °· Do not take laxatives unless directed to do so by your health care provider. °· Try a clear liquid diet (broth, tea, or water) as directed by your health care provider. Slowly move to a bland diet as tolerated. °SEEK MEDICAL CARE IF: °· You have unexplained abdominal pain. °· You have abdominal pain associated with nausea or diarrhea. °· You have pain when you urinate or have a bowel movement. °· You experience abdominal pain that wakes you in the night. °· You have abdominal pain that is worsened or improved by eating food. °· You have abdominal pain that is worsened with eating fatty foods. °· You have a fever. °SEEK IMMEDIATE MEDICAL CARE IF:  °· Your pain does not go away within 2 hours. °· You keep throwing up (vomiting). °· Your pain is felt only in portions of the abdomen, such as the right side or the left lower portion of the abdomen. °· You pass bloody or black tarry stools. °MAKE SURE YOU: °· Understand these instructions.   °· Will watch your condition.   °· Will get help right away if you are not doing well or get worse.   °Document Released: 04/02/2005 Document Revised: 06/28/2013 Document Reviewed: 03/02/2013 °[employer]® Patient Information  ©2015 [employer]. This information is not intended to replace advice given to you by your health care provider. Make sure you discuss any questions you have with your health care provider. ° °Gastritis, Adult °Gastritis is soreness and swelling (inflammation) of the lining of the stomach. Gastritis can develop as a sudden onset (acute) or long-term (chronic) condition. If gastritis is not treated, it can lead to stomach bleeding and ulcers. °CAUSES  °Gastritis occurs when the stomach lining is weak or damaged. Digestive juices from the stomach then inflame the weakened stomach lining. The stomach lining may be weak or damaged due to viral or bacterial infections. One common bacterial infection is the Helicobacter pylori infection. Gastritis can also result from excessive alcohol consumption, taking certain medicines, or having too much acid in the stomach.  °SYMPTOMS  °In some cases, there are no symptoms. When symptoms are present, they may include: °· Pain or a burning sensation in the upper abdomen. °· Nausea. °· Vomiting. °· An uncomfortable feeling of fullness after eating. °DIAGNOSIS  °Your caregiver may suspect you have gastritis based on your symptoms and a physical exam. To determine the cause of your gastritis, your caregiver may perform the following: °· Blood or stool tests to check for the H pylori bacterium. °· Gastroscopy. A thin, flexible tube (endoscope) is passed down the esophagus and into the stomach. The endoscope has a light and camera on the end. Your caregiver uses the endoscope to view the inside of the stomach. °· Taking a tissue sample (biopsy)   from the stomach to examine under a microscope. °TREATMENT  °Depending on the cause of your gastritis, medicines may be prescribed. If you have a bacterial infection, such as an H pylori infection, antibiotics may be given. If your gastritis is caused by too much acid in the stomach, H2 blockers or antacids may be given. Your caregiver may  recommend that you stop taking aspirin, ibuprofen, or other nonsteroidal anti-inflammatory drugs (NSAIDs). °HOME CARE INSTRUCTIONS °· Only take over-the-counter or prescription medicines as directed by your caregiver. °· If you were given antibiotic medicines, take them as directed. Finish them even if you start to feel better. °· Drink enough fluids to keep your urine clear or pale yellow. °· Avoid foods and drinks that make your symptoms worse, such as: °¨ Caffeine or alcoholic drinks. °¨ Chocolate. °¨ Peppermint or mint flavorings. °¨ Garlic and onions. °¨ Spicy foods. °¨ Citrus fruits, such as oranges, lemons, or limes. °¨ Tomato-based foods such as sauce, chili, salsa, and pizza. °¨ Fried and fatty foods. °· Eat small, frequent meals instead of large meals. °SEEK IMMEDIATE MEDICAL CARE IF:  °· You have black or dark red stools. °· You vomit blood or material that looks like coffee grounds. °· You are unable to keep fluids down. °· Your abdominal pain gets worse. °· You have a fever. °· You do not feel better after 1 week. °· You have any other questions or concerns. °MAKE SURE YOU: °· Understand these instructions. °· Will watch your condition. °· Will get help right away if you are not doing well or get worse. °Document Released: 06/17/2001 Document Revised: 12/23/2011 Document Reviewed: 08/06/2011 °ExitCare® Patient Information ©2015 [employer]. This information is not intended to replace advice given to you by your health care provider. Make sure you discuss any questions you have with your health care provider. ° °

## 2015-01-22 NOTE — ED Notes (Signed)
Pt states that he has had abdominal pain for 1 weeks that is worse after eating.

## 2015-06-09 ENCOUNTER — Encounter (HOSPITAL_COMMUNITY): Payer: Self-pay

## 2015-06-09 ENCOUNTER — Emergency Department (HOSPITAL_COMMUNITY)
Admission: EM | Admit: 2015-06-09 | Discharge: 2015-06-09 | Disposition: A | Discharge status: Home / Self Care | Payer: Medicaid Other | Attending: Physician Assistant | Admitting: Physician Assistant

## 2015-06-09 DIAGNOSIS — K859 Acute pancreatitis without necrosis or infection, unspecified: Secondary | ICD-10-CM | POA: Diagnosis not present

## 2015-06-09 DIAGNOSIS — M19012 Primary osteoarthritis, left shoulder: Secondary | ICD-10-CM | POA: Diagnosis not present

## 2015-06-09 DIAGNOSIS — R1012 Left upper quadrant pain: Secondary | ICD-10-CM | POA: Diagnosis present

## 2015-06-09 DIAGNOSIS — F17210 Nicotine dependence, cigarettes, uncomplicated: Secondary | ICD-10-CM | POA: Diagnosis not present

## 2015-06-09 LAB — COMPREHENSIVE METABOLIC PANEL
ALBUMIN: 2.9 g/dL — AB (ref 3.5–5.0)
ALT: 13 U/L — ABNORMAL LOW (ref 17–63)
AST: 17 U/L (ref 15–41)
Alkaline Phosphatase: 88 U/L (ref 38–126)
Anion gap: 7 (ref 5–15)
CHLORIDE: 101 mmol/L (ref 101–111)
CO2: 29 mmol/L (ref 22–32)
Calcium: 8.9 mg/dL (ref 8.9–10.3)
Creatinine, Ser: 0.9 mg/dL (ref 0.61–1.24)
GFR calc non Af Amer: >60 mL/min (ref >60)
Glucose, Bld: 100 mg/dL — ABNORMAL HIGH (ref 65–99)
Potassium: 4 mmol/L (ref 3.5–5.1)
SODIUM: 137 mmol/L (ref 135–145)
Total Bilirubin: 0.8 mg/dL (ref 0.3–1.2)
Total Protein: 5.5 g/dL — ABNORMAL LOW (ref 6.5–8.1)

## 2015-06-09 LAB — LIPASE, BLOOD: Lipase: 274 U/L — ABNORMAL HIGH (ref 11–51)

## 2015-06-09 LAB — CBC WITH DIFFERENTIAL/PLATELET
Basophils Absolute: 0 10*3/uL (ref 0.0–0.1)
Basophils Relative: 0 %
Eosinophils Absolute: 0.1 10*3/uL (ref 0.0–0.7)
Eosinophils Relative: 2 %
HCT: 40.5 % (ref 39.0–52.0)
Hemoglobin: 13 g/dL (ref 13.0–17.0)
Lymphocytes Relative: 31 %
Lymphs Abs: 1.8 10*3/uL (ref 0.7–4.0)
MCH: 30.7 pg (ref 26.0–34.0)
MCHC: 32.1 g/dL (ref 30.0–36.0)
MCV: 95.5 fL (ref 78.0–100.0)
Monocytes Absolute: 0.5 10*3/uL (ref 0.1–1.0)
Monocytes Relative: 8 %
Neutro Abs: 3.4 10*3/uL (ref 1.7–7.7)
Neutrophils Relative %: 59 %
Platelets: 309 10*3/uL (ref 150–400)
RBC: 4.24 MIL/uL (ref 4.22–5.81)
RDW: 14.8 % (ref 11.5–15.5)
WBC: 5.9 10*3/uL (ref 4.0–10.5)

## 2015-06-09 LAB — URINE MICROSCOPIC-ADD ON
Bacteria, UA: NONE SEEN
SQUAMOUS EPITHELIAL / LPF: NONE SEEN

## 2015-06-09 LAB — URINALYSIS, ROUTINE W REFLEX MICROSCOPIC
BILIRUBIN URINE: NEGATIVE
Glucose, UA: NEGATIVE mg/dL
Hgb urine dipstick: NEGATIVE
Ketones, ur: NEGATIVE mg/dL
NITRITE: NEGATIVE
PH: 6.5 (ref 5.0–8.0)
Protein, ur: NEGATIVE mg/dL
SPECIFIC GRAVITY, URINE: 1.02 (ref 1.005–1.030)

## 2015-06-09 MED ORDER — ONDANSETRON 4 MG PO TBDP: 4.0000 mg | ORAL_TABLET | Freq: Three times a day (TID) | ORAL | Status: DC | PRN

## 2015-06-09 MED ORDER — HYDROCODONE-ACETAMINOPHEN 5-325 MG PO TABS
1.0000 | ORAL_TABLET | Freq: Once | ORAL | Status: AC
  Administered 2015-06-09: 1 via ORAL
  Filled 2015-06-09: qty 1

## 2015-06-09 MED ORDER — HYDROCODONE-ACETAMINOPHEN 5-325 MG PO TABS: 2.0000 | ORAL_TABLET | ORAL | Status: DC | PRN

## 2015-06-09 MED ORDER — SODIUM CHLORIDE 0.9 % IV BOLUS (SEPSIS)
1000.0000 mL | Freq: Once | INTRAVENOUS | Status: AC
  Administered 2015-06-09: 1000 mL via INTRAVENOUS

## 2015-06-09 NOTE — ED Notes (Signed)
Pt reports left flank pain that is described as an intermittent aching.  Pt denies any urinary symptoms, n/v/d or chest pain.  Pt reports pain radiates to back.  Pain worsens with deep breathing and straining with BM.

## 2015-06-09 NOTE — Discharge Instructions (Signed)
Take your medications as prescribed as needed for pain and nausea. I recommend having a clear liquid diet for the next 48 hours, then I would recommend starting a low fat diet. It is important to refrain from drinking alcohol during this time to prevent exacerbation of pancreatitis. Follow-up with your primary care provider in 4-5 days. Return to the emergency department if symptoms worsen or new onset of fever, abdominal pain, vomiting, vomiting blood, unable to keep fluids or food down.

## 2015-06-09 NOTE — ED Provider Notes (Signed)
CSN: [id number]     Arrival date & time 06/09/15  0920 History   First MD Initiated Contact with Patient 06/09/15 [phone]     Chief Complaint  Patient presents with  . Flank Pain     (Consider location/radiation/quality/duration/timing/severity/associated sxs/prior Treatment) Patient is a 64 y.o. male presenting with flank pain.  Flank Pain Associated symptoms include abdominal pain.   Pt is a 64 yo male with PMH of stomach ulcers, pancreatitis, GERD and cirrhosis who presents to the ED with complaint of left flank pain, onset 3 days. Pt reports having intermittent aching pain to his LUQ that radiates around to his left flank. Pain aggravated with movement, deep breathing and straining with BM. He notes he took tylenol at home with mild relief. Denies fever, chills, SOB, CP, N/V/D, urinary sxs, blood in urine or stool. Denies any recent fall, trauma, injury or heavy lifting/straining. Pt denies numbness, tingling, saddle anesthesia, loss of bowel or bladder, weakness. Patient reports he last consumed alcohol over week ago. He notes he is in the process of trying to quit drinking completely.  Past Medical History  Diagnosis Date  . Intestine disorder     intestinal blockage  . History of stomach ulcers   . Pancreatitis   . GERD (gastroesophageal reflux disease)     occasional   . Arthritis     left shoulder   . Cirrhosis ([place])     hx of   . Swelling of joint of right knee     05-11-14 has informed DR., awaitng for instructions-denies warmth, has intrmittent pain or discomfort   Past Surgical History  Procedure Laterality Date  . Inguinal hernia repair Right 09/29/2012    Procedure: HERNIA REPAIR INGUINAL ADULT;  Surgeon: Imogene Burn. Tsuei, MD;  Location: [facility];  Service: General;  Laterality: Right;  . Umbilical hernia repair N/A 09/29/2012    Procedure: HERNIA REPAIR UMBILICAL ADULT;  Surgeon: Imogene Burn. Georgette Dover, MD;  Location: [facility];  Service: General;  Laterality: N/A;  . Insertion of  mesh Right 09/29/2012    Procedure: INSERTION OF MESH;  Surgeon: Imogene Burn. Georgette Dover, MD;  Location: [facility];  Service: General;  Laterality: Right;  . Eus N/A 07/13/2013    Procedure: ESOPHAGEAL ENDOSCOPIC ULTRASOUND (EUS) RADIAL;  Surgeon: Arta Silence, MD;  Location: [facility];  Service: Endoscopy;  Laterality: N/A;   Family History  Problem Relation Age of Onset  . Hypertension Father    Social History  Substance Use Topics  . Smoking status: Light Tobacco Smoker -- 0.25 packs/day for 40 years    Types: Cigarettes  . Smokeless tobacco: Never Used     Comment: 2-3 cigarettes per day  . Alcohol Use: Yes     Comment: states stopped 3 months ago, none since    Review of Systems  Gastrointestinal: Positive for abdominal pain.  Genitourinary: Positive for flank pain.  All other systems reviewed and are negative.     Allergies  Review of patient's allergies indicates no known allergies.  Home Medications   Prior to Admission medications   Medication Sig Start Date End Date Taking? Authorizing Provider  acetaminophen (TYLENOL) 325 MG tablet Take 650 mg by mouth every 6 (six) hours as needed (pain).   Yes Historical Provider, MD  Bisacodyl (LAXATIVE PO) Take 1 tablet by mouth daily as needed (constipation).   Yes Historical Provider, MD  Phenyleph-Doxylamine-DM-APAP (ALKA SELTZER PLUS PO) Take 2 capsules by mouth daily as needed (for cold).   Yes Historical  Provider, MD  HYDROcodone-acetaminophen (NORCO/VICODIN) 5-325 MG tablet Take 2 tablets by mouth every 4 (four) hours as needed. 06/09/15   Nona Dell, PA-C  ondansetron (ZOFRAN ODT) 4 MG disintegrating tablet Take 1 tablet (4 mg total) by mouth every 8 (eight) hours as needed for nausea or vomiting. 06/09/15   Chesley Noon Twilla Khouri, PA-C   BP 121/73 mmHg  Pulse 55  Temp(Src) 97.4 F (36.3 C) (Oral)  Resp 18  Ht 5\' 7"  (1.702 m)  Wt 63.504 kg  BMI 21.92 kg/m2  SpO2 100% Physical Exam  Constitutional: He is  oriented to person, place, and time. He appears well-developed and well-nourished. No distress.  HENT:  Head: Normocephalic and atraumatic.  Mouth/Throat: Oropharynx is clear and moist. No oropharyngeal exudate.  Eyes: Conjunctivae and EOM are normal. Right eye exhibits no discharge. Left eye exhibits no discharge. No scleral icterus.  Neck: Normal range of motion. Neck supple.  Cardiovascular: Normal rate, regular rhythm, normal heart sounds and intact distal pulses.   Pulmonary/Chest: Effort normal and breath sounds normal. No respiratory distress. He has no wheezes. He has no rales. He exhibits no tenderness.  Abdominal: Soft. Bowel sounds are normal. He exhibits no distension and no mass. There is tenderness (mild epigastric and LUQ pain radiating laterally to left flank) in the epigastric area and left upper quadrant. There is CVA tenderness (mild left CVA tenderness). There is no rigidity, no rebound, no guarding, no tenderness at McBurney's point and negative Murphy's sign.  Musculoskeletal: Normal range of motion. He exhibits no edema or tenderness.  No C/T/L midline tenderness. FROM of back. 2+ PT pulses. 5/5 strength BLE. Sensation intact.   Lymphadenopathy:    He has no cervical adenopathy.  Neurological: He is alert and oriented to person, place, and time. He has normal strength and normal reflexes. No sensory deficit. Gait normal.  Skin: Skin is warm and dry. He is not diaphoretic.  Nursing note and vitals reviewed.   ED Course  Procedures (including critical care time) Labs Review Labs Reviewed  LIPASE, BLOOD - Abnormal; Notable for the following:    Lipase 274 (*)    All other components within normal limits  COMPREHENSIVE METABOLIC PANEL - Abnormal; Notable for the following:    Glucose, Bld 100 (*)    BUN <5 (*)    Total Protein 5.5 (*)    Albumin 2.9 (*)    ALT 13 (*)    All other components within normal limits  URINALYSIS, ROUTINE W REFLEX MICROSCOPIC (NOT AT [facility])  - Abnormal; Notable for the following:    Leukocytes, UA TRACE (*)    All other components within normal limits  URINE MICROSCOPIC-ADD ON - Abnormal; Notable for the following:    Casts HYALINE CASTS (*)    All other components within normal limits  CBC WITH DIFFERENTIAL/PLATELET    Imaging Review No results found. I have personally reviewed and evaluated these images and lab results as part of my medical decision-making.  Filed Vitals:   06/09/15 1400 06/09/15 1415  BP: 115/79 121/73  Pulse: 54 55  Temp:    Resp:       MDM   Final diagnoses:  Acute pancreatitis, unspecified complication status, unspecified pancreatitis type    Patient presents with left upper quadrant pain radiating around to his left flank. Mild relief with Tylenol at home. History of stomach ulcers, pancreatitis, GERD and cirrhosis. VSS. Exam revealed mild tenderness to epigastric and left upper quadrant region, no peritoneal signs,  mild left CVA tenderness. Lipase 274. Remaining labs and urine unremarkable. I suspect sxs are likely due to acute pancreatitis.  11:15 - IVF started. Pt reports his pain has improved. Discussed results with pt, patient reports he last consumed alcohol over week ago.  Patient able to tolerate by mouth. Plan to discharge patient home. Advised patient to follow up with his primary care provider this week. Evaluation does not show pathology requring ongoing emergent intervention or admission. Pt is hemodynamically stable and mentating appropriately. All questions answered. Return precautions discussed and outpatient follow up given.    Chesley Noon [place], [place] 06/09/15 1523  Courteney Julio Alm, MD 06/09/15 1523

## 2015-08-08 DIAGNOSIS — I82890 Acute embolism and thrombosis of other specified veins: Secondary | ICD-10-CM

## 2015-08-08 DIAGNOSIS — K922 Gastrointestinal hemorrhage, unspecified: Secondary | ICD-10-CM

## 2015-08-08 DIAGNOSIS — D649 Anemia, unspecified: Secondary | ICD-10-CM

## 2015-08-08 HISTORY — DX: Acute embolism and thrombosis of other specified veins: I82.890

## 2015-08-08 HISTORY — DX: Gastrointestinal hemorrhage, unspecified: K92.2

## 2015-08-08 HISTORY — DX: Anemia, unspecified: D64.9

## 2015-09-02 ENCOUNTER — Encounter (HOSPITAL_COMMUNITY)
Admission: EM | Disposition: A | Discharge status: Home / Self Care | Payer: Self-pay | Source: Home / Self Care | Attending: Internal Medicine

## 2015-09-02 ENCOUNTER — Emergency Department (HOSPITAL_COMMUNITY): Payer: Medicaid Other

## 2015-09-02 ENCOUNTER — Inpatient Hospital Stay (HOSPITAL_COMMUNITY)
Admission: EM | Admit: 2015-09-02 | Discharge: 2015-09-07 | DRG: 299 | Disposition: A | Discharge status: Home / Self Care | Payer: Medicaid Other | Attending: Internal Medicine | Admitting: Internal Medicine

## 2015-09-02 ENCOUNTER — Inpatient Hospital Stay (HOSPITAL_COMMUNITY): Payer: Medicaid Other

## 2015-09-02 ENCOUNTER — Encounter (HOSPITAL_COMMUNITY): Payer: Self-pay | Admitting: Emergency Medicine

## 2015-09-02 DIAGNOSIS — R109 Unspecified abdominal pain: Secondary | ICD-10-CM | POA: Diagnosis present

## 2015-09-02 DIAGNOSIS — K852 Alcohol induced acute pancreatitis without necrosis or infection: Secondary | ICD-10-CM | POA: Diagnosis present

## 2015-09-02 DIAGNOSIS — R935 Abnormal findings on diagnostic imaging of other abdominal regions, including retroperitoneum: Secondary | ICD-10-CM

## 2015-09-02 DIAGNOSIS — K219 Gastro-esophageal reflux disease without esophagitis: Secondary | ICD-10-CM | POA: Diagnosis present

## 2015-09-02 DIAGNOSIS — K648 Other hemorrhoids: Secondary | ICD-10-CM | POA: Diagnosis present

## 2015-09-02 DIAGNOSIS — K863 Pseudocyst of pancreas: Secondary | ICD-10-CM | POA: Diagnosis present

## 2015-09-02 DIAGNOSIS — D122 Benign neoplasm of ascending colon: Secondary | ICD-10-CM | POA: Insufficient documentation

## 2015-09-02 DIAGNOSIS — K921 Melena: Secondary | ICD-10-CM | POA: Diagnosis present

## 2015-09-02 DIAGNOSIS — K7030 Alcoholic cirrhosis of liver without ascites: Secondary | ICD-10-CM | POA: Diagnosis not present

## 2015-09-02 DIAGNOSIS — K922 Gastrointestinal hemorrhage, unspecified: Secondary | ICD-10-CM | POA: Diagnosis not present

## 2015-09-02 DIAGNOSIS — E538 Deficiency of other specified B group vitamins: Secondary | ICD-10-CM | POA: Diagnosis present

## 2015-09-02 DIAGNOSIS — F1020 Alcohol dependence, uncomplicated: Secondary | ICD-10-CM | POA: Diagnosis present

## 2015-09-02 DIAGNOSIS — D62 Acute posthemorrhagic anemia: Secondary | ICD-10-CM | POA: Insufficient documentation

## 2015-09-02 DIAGNOSIS — Z79899 Other long term (current) drug therapy: Secondary | ICD-10-CM

## 2015-09-02 DIAGNOSIS — F1010 Alcohol abuse, uncomplicated: Secondary | ICD-10-CM | POA: Diagnosis not present

## 2015-09-02 DIAGNOSIS — E1165 Type 2 diabetes mellitus with hyperglycemia: Secondary | ICD-10-CM | POA: Diagnosis present

## 2015-09-02 DIAGNOSIS — K5900 Constipation, unspecified: Secondary | ICD-10-CM | POA: Diagnosis present

## 2015-09-02 DIAGNOSIS — D735 Infarction of spleen: Secondary | ICD-10-CM | POA: Diagnosis present

## 2015-09-02 DIAGNOSIS — I82890 Acute embolism and thrombosis of other specified veins: Secondary | ICD-10-CM | POA: Insufficient documentation

## 2015-09-02 DIAGNOSIS — E876 Hypokalemia: Secondary | ICD-10-CM | POA: Diagnosis present

## 2015-09-02 DIAGNOSIS — K5790 Diverticulosis of intestine, part unspecified, without perforation or abscess without bleeding: Secondary | ICD-10-CM | POA: Diagnosis present

## 2015-09-02 DIAGNOSIS — K7460 Unspecified cirrhosis of liver: Secondary | ICD-10-CM | POA: Diagnosis present

## 2015-09-02 DIAGNOSIS — F17210 Nicotine dependence, cigarettes, uncomplicated: Secondary | ICD-10-CM | POA: Diagnosis present

## 2015-09-02 DIAGNOSIS — I864 Gastric varices: Secondary | ICD-10-CM | POA: Diagnosis present

## 2015-09-02 DIAGNOSIS — K859 Acute pancreatitis without necrosis or infection, unspecified: Secondary | ICD-10-CM | POA: Diagnosis present

## 2015-09-02 DIAGNOSIS — Z681 Body mass index (BMI) 19 or less, adult: Secondary | ICD-10-CM | POA: Diagnosis not present

## 2015-09-02 DIAGNOSIS — E43 Unspecified severe protein-calorie malnutrition: Secondary | ICD-10-CM | POA: Diagnosis present

## 2015-09-02 DIAGNOSIS — I81 Portal vein thrombosis: Secondary | ICD-10-CM | POA: Diagnosis present

## 2015-09-02 DIAGNOSIS — K709 Alcoholic liver disease, unspecified: Secondary | ICD-10-CM

## 2015-09-02 DIAGNOSIS — Q8901 Asplenia (congenital): Secondary | ICD-10-CM | POA: Insufficient documentation

## 2015-09-02 DIAGNOSIS — K860 Alcohol-induced chronic pancreatitis: Secondary | ICD-10-CM | POA: Diagnosis present

## 2015-09-02 DIAGNOSIS — Z8711 Personal history of peptic ulcer disease: Secondary | ICD-10-CM | POA: Diagnosis not present

## 2015-09-02 DIAGNOSIS — K869 Disease of pancreas, unspecified: Secondary | ICD-10-CM | POA: Diagnosis not present

## 2015-09-02 DIAGNOSIS — K8689 Other specified diseases of pancreas: Secondary | ICD-10-CM | POA: Insufficient documentation

## 2015-09-02 HISTORY — DX: Gastrointestinal hemorrhage, unspecified: K92.2

## 2015-09-02 HISTORY — PX: ESOPHAGOGASTRODUODENOSCOPY: SHX5428

## 2015-09-02 HISTORY — DX: Unspecified intestinal obstruction, unspecified as to partial versus complete obstruction: K56.609

## 2015-09-02 HISTORY — DX: Other ascites: R18.8

## 2015-09-02 HISTORY — DX: Acute embolism and thrombosis of other specified veins: I82.890

## 2015-09-02 HISTORY — DX: Anemia, unspecified: D64.9

## 2015-09-02 HISTORY — DX: Polyp of colon: K63.5

## 2015-09-02 HISTORY — DX: Effusion, right knee: M25.461

## 2015-09-02 HISTORY — DX: Alcohol dependence, uncomplicated: F10.20

## 2015-09-02 LAB — CBC WITH DIFFERENTIAL/PLATELET
BASOS ABS: 0 10*3/uL (ref 0.0–0.1)
BASOS PCT: 0 %
EOS PCT: 0 %
Eosinophils Absolute: 0 10*3/uL (ref 0.0–0.7)
HCT: 25 % — ABNORMAL LOW (ref 39.0–52.0)
Hemoglobin: 8.3 g/dL — ABNORMAL LOW (ref 13.0–17.0)
Lymphocytes Relative: 7 %
Lymphs Abs: 1.3 10*3/uL (ref 0.7–4.0)
MCH: 30.7 pg (ref 26.0–34.0)
MCHC: 33.2 g/dL (ref 30.0–36.0)
MCV: 92.6 fL (ref 78.0–100.0)
MONO ABS: 2 10*3/uL — AB (ref 0.1–1.0)
Monocytes Relative: 11 %
Neutro Abs: 14.6 10*3/uL — ABNORMAL HIGH (ref 1.7–7.7)
Neutrophils Relative %: 82 %
PLATELETS: 358 10*3/uL (ref 150–400)
RBC: 2.7 MIL/uL — ABNORMAL LOW (ref 4.22–5.81)
RDW: 13.9 % (ref 11.5–15.5)
WBC: 17.9 10*3/uL — ABNORMAL HIGH (ref 4.0–10.5)

## 2015-09-02 LAB — RETICULOCYTES
RBC.: 2.7 MIL/uL — AB (ref 4.22–5.81)
Retic Count, Absolute: 72.9 10*3/uL (ref 19.0–186.0)
Retic Ct Pct: 2.7 % (ref 0.4–3.1)

## 2015-09-02 LAB — LIPASE, BLOOD: LIPASE: 99 U/L — AB (ref 11–51)

## 2015-09-02 LAB — I-STAT CHEM 8, ED
BUN: 15 mg/dL (ref 6–20)
Calcium, Ion: 1.05 mmol/L — ABNORMAL LOW (ref 1.13–1.30)
Chloride: 93 mmol/L — ABNORMAL LOW (ref 101–111)
Creatinine, Ser: 0.7 mg/dL (ref 0.61–1.24)
GLUCOSE: 244 mg/dL — AB (ref 65–99)
HEMATOCRIT: 26 % — AB (ref 39.0–52.0)
HEMOGLOBIN: 8.8 g/dL — AB (ref 13.0–17.0)
POTASSIUM: 4.4 mmol/L (ref 3.5–5.1)
Sodium: 131 mmol/L — ABNORMAL LOW (ref 135–145)
TCO2: 22 mmol/L (ref 0–100)

## 2015-09-02 LAB — COMPREHENSIVE METABOLIC PANEL
ALT: 17 U/L (ref 17–63)
ANION GAP: 16 — AB (ref 5–15)
AST: 57 U/L — ABNORMAL HIGH (ref 15–41)
Albumin: 2.5 g/dL — ABNORMAL LOW (ref 3.5–5.0)
Alkaline Phosphatase: 72 U/L (ref 38–126)
BUN: 13 mg/dL (ref 6–20)
CHLORIDE: 95 mmol/L — AB (ref 101–111)
CO2: 22 mmol/L (ref 22–32)
CREATININE: 1.05 mg/dL (ref 0.61–1.24)
Calcium: 8.7 mg/dL — ABNORMAL LOW (ref 8.9–10.3)
Glucose, Bld: 239 mg/dL — ABNORMAL HIGH (ref 65–99)
POTASSIUM: 4.8 mmol/L (ref 3.5–5.1)
SODIUM: 133 mmol/L — AB (ref 135–145)
Total Bilirubin: 0.9 mg/dL (ref 0.3–1.2)
Total Protein: 5.3 g/dL — ABNORMAL LOW (ref 6.5–8.1)

## 2015-09-02 LAB — IRON AND TIBC
Iron: 16 ug/dL — ABNORMAL LOW (ref 45–182)
SATURATION RATIOS: 9 % — AB (ref 17.9–39.5)
TIBC: 176 ug/dL — ABNORMAL LOW (ref 250–450)
UIBC: 160 ug/dL

## 2015-09-02 LAB — CBC
HCT: 22 % — ABNORMAL LOW (ref 39.0–52.0)
HEMOGLOBIN: 7.1 g/dL — AB (ref 13.0–17.0)
MCH: 30 pg (ref 26.0–34.0)
MCHC: 32.3 g/dL (ref 30.0–36.0)
MCV: 92.8 fL (ref 78.0–100.0)
Platelets: 194 10*3/uL (ref 150–400)
RBC: 2.37 MIL/uL — AB (ref 4.22–5.81)
RDW: 14.2 % (ref 11.5–15.5)
WBC: 11.8 10*3/uL — ABNORMAL HIGH (ref 4.0–10.5)

## 2015-09-02 LAB — URINALYSIS, ROUTINE W REFLEX MICROSCOPIC
BILIRUBIN URINE: NEGATIVE
Glucose, UA: NEGATIVE mg/dL
Hgb urine dipstick: NEGATIVE
KETONES UR: 15 mg/dL — AB
Leukocytes, UA: NEGATIVE
NITRITE: NEGATIVE
PROTEIN: NEGATIVE mg/dL
Specific Gravity, Urine: 1.01 (ref 1.005–1.030)
pH: 5.5 (ref 5.0–8.0)

## 2015-09-02 LAB — I-STAT CG4 LACTIC ACID, ED
LACTIC ACID, VENOUS: 5.28 mmol/L — AB (ref 0.5–2.0)
LACTIC ACID, VENOUS: 1.65 mmol/L (ref 0.5–2.0)

## 2015-09-02 LAB — PROTIME-INR
INR: 1.4 (ref 0.00–1.49)
PROTHROMBIN TIME: 17.3 s — AB (ref 11.6–15.2)

## 2015-09-02 LAB — ETHANOL: Alcohol, Ethyl (B): <5 mg/dL (ref <5)

## 2015-09-02 LAB — POC OCCULT BLOOD, ED: FECAL OCCULT BLD: POSITIVE — AB

## 2015-09-02 LAB — PREPARE RBC (CROSSMATCH)

## 2015-09-02 LAB — FERRITIN: Ferritin: 179 ng/mL (ref 24–336)

## 2015-09-02 LAB — CBG MONITORING, ED: GLUCOSE-CAPILLARY: 166 mg/dL — AB (ref 65–99)

## 2015-09-02 LAB — MRSA PCR SCREENING: MRSA BY PCR: NEGATIVE

## 2015-09-02 LAB — ABO/RH: ABO/RH(D): O POS

## 2015-09-02 LAB — FOLATE: FOLATE: 10.1 ng/mL (ref >5.9)

## 2015-09-02 LAB — VITAMIN B12: Vitamin B-12: 163 pg/mL — ABNORMAL LOW (ref 180–914)

## 2015-09-02 SURGERY — EGD (ESOPHAGOGASTRODUODENOSCOPY)
Anesthesia: Moderate Sedation | Laterality: Left

## 2015-09-02 MED ORDER — SODIUM CHLORIDE 0.9 % IV BOLUS (SEPSIS)
500.0000 mL | Freq: Once | INTRAVENOUS | Status: AC
  Administered 2015-09-02: 500 mL via INTRAVENOUS

## 2015-09-02 MED ORDER — SODIUM CHLORIDE 0.9 % IV SOLN
50.0000 ug/h | INTRAVENOUS | Status: DC
  Administered 2015-09-02 – 2015-09-03 (×3): 50 ug/h via INTRAVENOUS
  Filled 2015-09-02 (×6): qty 1

## 2015-09-02 MED ORDER — DIPHENHYDRAMINE HCL 25 MG PO CAPS: 25.0000 mg | ORAL_CAPSULE | Freq: Once | ORAL | Status: DC

## 2015-09-02 MED ORDER — DIPHENHYDRAMINE HCL 50 MG/ML IJ SOLN
INTRAMUSCULAR | Status: AC
Start: 2015-09-02 — End: 2015-09-02
  Filled 2015-09-02: qty 1

## 2015-09-02 MED ORDER — SODIUM CHLORIDE 0.9 % IV SOLN
80.0000 mg | Freq: Once | INTRAVENOUS | Status: AC
  Administered 2015-09-02: 80 mg via INTRAVENOUS
  Filled 2015-09-02: qty 80

## 2015-09-02 MED ORDER — DIPHENHYDRAMINE HCL 50 MG/ML IJ SOLN
INTRAMUSCULAR | Status: DC | PRN
Start: 2015-09-02 — End: 2015-09-02
  Administered 2015-09-02 (×2): 25 mg via INTRAVENOUS

## 2015-09-02 MED ORDER — LORAZEPAM 2 MG/ML IJ SOLN: 1.0000 mg | Freq: Four times a day (QID) | INTRAMUSCULAR | Status: AC | PRN

## 2015-09-02 MED ORDER — VITAMIN K1 10 MG/ML IJ SOLN
5.0000 mg | Freq: Once | INTRAVENOUS | Status: AC
  Administered 2015-09-02: 5 mg via INTRAVENOUS
  Filled 2015-09-02 (×2): qty 0.5

## 2015-09-02 MED ORDER — FENTANYL CITRATE (PF) 100 MCG/2ML IJ SOLN
INTRAMUSCULAR | Status: DC | PRN
  Administered 2015-09-02: 25 ug via INTRAVENOUS
  Administered 2015-09-02: 50 ug via INTRAVENOUS

## 2015-09-02 MED ORDER — SODIUM CHLORIDE 0.9 % IV SOLN
Freq: Once | INTRAVENOUS | Status: AC
  Administered 2015-09-02: 500 mL via INTRAVENOUS

## 2015-09-02 MED ORDER — DEXTROSE 5 % IV SOLN
1.0000 g | INTRAVENOUS | Status: AC
  Administered 2015-09-02 – 2015-09-06 (×5): 1 g via INTRAVENOUS
  Filled 2015-09-02 (×6): qty 10

## 2015-09-02 MED ORDER — IOHEXOL 300 MG/ML  SOLN
100.0000 mL | Freq: Once | INTRAMUSCULAR | Status: AC | PRN
  Administered 2015-09-02: 100 mL via INTRAVENOUS

## 2015-09-02 MED ORDER — LORAZEPAM 1 MG PO TABS
1.0000 mg | ORAL_TABLET | Freq: Four times a day (QID) | ORAL | Status: AC | PRN
  Administered 2015-09-03: 1 mg via ORAL
  Filled 2015-09-02: qty 1

## 2015-09-02 MED ORDER — FOLIC ACID 1 MG PO TABS
1.0000 mg | ORAL_TABLET | Freq: Every day | ORAL | Status: DC
  Administered 2015-09-03 – 2015-09-06 (×4): 1 mg via ORAL
  Filled 2015-09-02 (×4): qty 1

## 2015-09-02 MED ORDER — PANTOPRAZOLE SODIUM 40 MG IV SOLR: 40.0000 mg | Freq: Two times a day (BID) | INTRAVENOUS | Status: DC

## 2015-09-02 MED ORDER — FENTANYL CITRATE (PF) 100 MCG/2ML IJ SOLN
INTRAMUSCULAR | Status: AC
  Filled 2015-09-02: qty 4

## 2015-09-02 MED ORDER — HYDROMORPHONE HCL 1 MG/ML IJ SOLN
0.5000 mg | Freq: Once | INTRAMUSCULAR | Status: AC
Start: 2015-09-02 — End: 2015-09-02
  Administered 2015-09-02: 0.5 mg via INTRAVENOUS
  Filled 2015-09-02: qty 1

## 2015-09-02 MED ORDER — MIDAZOLAM HCL 5 MG/ML IJ SOLN
INTRAMUSCULAR | Status: AC
  Filled 2015-09-02: qty 2

## 2015-09-02 MED ORDER — PANTOPRAZOLE SODIUM 40 MG IV SOLR
40.0000 mg | Freq: Once | INTRAVENOUS | Status: AC
  Administered 2015-09-02: 40 mg via INTRAVENOUS
  Filled 2015-09-02: qty 40

## 2015-09-02 MED ORDER — ADULT MULTIVITAMIN W/MINERALS CH
1.0000 | ORAL_TABLET | Freq: Every day | ORAL | Status: DC
  Administered 2015-09-03 – 2015-09-06 (×4): 1 via ORAL
  Filled 2015-09-02 (×4): qty 1

## 2015-09-02 MED ORDER — SODIUM CHLORIDE 0.9 % IV SOLN
Freq: Once | INTRAVENOUS | Status: AC
  Administered 2015-09-02: 10 mL/h via INTRAVENOUS

## 2015-09-02 MED ORDER — SODIUM CHLORIDE 0.9 % IV SOLN
8.0000 mg/h | INTRAVENOUS | Status: DC
  Administered 2015-09-02 – 2015-09-03 (×3): 8 mg/h via INTRAVENOUS
  Filled 2015-09-02 (×7): qty 80

## 2015-09-02 MED ORDER — ACETAMINOPHEN 325 MG PO TABS: 650.0000 mg | ORAL_TABLET | Freq: Once | ORAL | Status: DC

## 2015-09-02 MED ORDER — SODIUM CHLORIDE 0.9 % IV BOLUS (SEPSIS)
1000.0000 mL | Freq: Once | INTRAVENOUS | Status: AC
  Administered 2015-09-02: 1000 mL via INTRAVENOUS

## 2015-09-02 MED ORDER — MIDAZOLAM HCL 10 MG/2ML IJ SOLN
INTRAMUSCULAR | Status: DC | PRN
Start: 2015-09-02 — End: 2015-09-02
  Administered 2015-09-02 (×2): 2 mg via INTRAVENOUS

## 2015-09-02 MED ORDER — VITAMIN B-1 100 MG PO TABS
100.0000 mg | ORAL_TABLET | Freq: Every day | ORAL | Status: DC
  Administered 2015-09-03 – 2015-09-06 (×4): 100 mg via ORAL
  Filled 2015-09-02 (×4): qty 1

## 2015-09-02 MED ORDER — THIAMINE HCL 100 MG/ML IJ SOLN: 100.0000 mg | Freq: Every day | INTRAMUSCULAR | Status: DC

## 2015-09-02 NOTE — Plan of Care (Signed)
Problem: Bowel/Gastric: Goal: Will show no signs and symptoms of gastrointestinal bleeding Outcome: Progressing Monitoring for blood stools

## 2015-09-02 NOTE — Progress Notes (Signed)
GI Update: Endoscopy just completed. 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 The remainder of the stomach was normal without focal ulcerations or erosions. The duodenal bulb and second portion of the duodenum did not have any pathology to cause bleeding. There was no old or fresh blood noted.   I reviewed the CT scan with radiology. 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 It is possible the cystic / mass lesion noted on CT could be a hemorrhagic pseudocyst or pseudoaneurysm. At this time I am not certain where he bled from but am concerned about the gastric varices and CT findings of the pancreas, both of which could have bled. He has also never had a colonoscopy, and lower GI bleed remains on the differential. If he rebleeds I would recommend a tagged RBC scan to help localize the bleeding source. I will let interventional radiology know about this patient in case he has bleeding overnight and sign him out to my colleagues. Continue IV octreotide for now and monitor serial Hgb. Please contact us with any evidence of bleeding overnight.   Riverside Cellar, MD [facility] Pager [phone]

## 2015-09-02 NOTE — H&P (Signed)
[facility] History and Physical  Alexander Schmitt PRF:[id number] DOB: 10-10-1950 DOA: 09/02/2015  Referring physician:  PCP: Marcy Salvo, NP   Chief Complaint: GI bleed                               Abdominal Pain  HPI: Alexander Schmitt is a 65 y.o. 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 Pain is located in the left upper quadrant, does not radiate, constant, without chest pain, cough, shortness of breath, diarrhea or fatigue. His appetite has decreased over the last month, losing 10-15 lbs during that period. He states "every time after drinking the stomach hurts"-he drinks 1 gallon of wine a day. No hematuria, but noted darker urine. He felt he was going to faint but did not LOC or hit head after falling.  He denied any significant shortness of breath or chest pain or palpitations. At the emergency department, Hemoccult was positive, sitting dark red stool. CT abdomen and pelvis showing inflammatory changes around the pancreas, compatible with acute pancreatitis. In addition, pseudocysts around in the pancreatic tail around 5.6 cm solid appearing mass anterior to the pancreas was noted. Hepatic cysts were seen as well. His H/H showed a Hb 8.3.  Vital signs are stable, afebrile. Saturation is normal at 100 and room air. CXR normal.  IV Protonix and Octreotide were initiated. Iron studies pending. GI to see.   ROS  Past Medical History  Diagnosis Date  . Intestine disorder     intestinal blockage  . History of stomach ulcers   . Pancreatitis   . GERD (gastroesophageal reflux disease)     occasional   . Arthritis     left shoulder   . Cirrhosis ([place])     hx of   . Swelling of joint of right knee     05-11-14 has informed DR., awaitng for instructions-denies  warmth, has intrmittent pain or discomfort   Past Surgical History  Procedure Laterality Date  . Inguinal hernia repair Right 09/29/2012    Procedure: HERNIA REPAIR INGUINAL ADULT;  Surgeon: Imogene Burn. Tsuei, MD;  Location: [facility];  Service: General;  Laterality: Right;  . Umbilical hernia repair N/A 09/29/2012    Procedure: HERNIA REPAIR UMBILICAL ADULT;  Surgeon: Imogene Burn. Georgette Dover, MD;  Location: [facility];  Service: General;  Laterality: N/A;  . Insertion of mesh Right 09/29/2012    Procedure: INSERTION OF MESH;  Surgeon: Imogene Burn. Georgette Dover, MD;  Location: [facility];  Service: General;  Laterality: Right;  . Eus N/A 07/13/2013    Procedure: ESOPHAGEAL ENDOSCOPIC ULTRASOUND (EUS) RADIAL;  Surgeon: Arta Silence, MD;  Location: [facility];  Service: Endoscopy;  Laterality: N/A;   Social History:  reports that he has been smoking Cigarettes.  He has a 10 pack-year smoking history. He has never used smokeless tobacco. He reports that he drinks alcohol. He reports that he does not use illicit drugs.  No Known Allergies  Family History  Problem Relation Age of Onset  . Hypertension Father      Prior to Admission medications   Medication Sig Start Date End Date Taking? Authorizing Provider  acetaminophen (TYLENOL) 325 MG tablet Take 650 mg by mouth every 6 (six) hours as needed (pain).  Yes Historical Provider, MD  bismuth subsalicylate (PEPTO BISMOL) 262 MG/15ML suspension Take 30 mLs by mouth every 6 (six) hours as needed for indigestion or diarrhea or loose stools.   Yes Historical Provider, MD  ibuprofen (ADVIL,MOTRIN) 200 MG tablet Take 200 mg by mouth every 6 (six) hours as needed for mild pain.   Yes Historical Provider, MD  magnesium hydroxide (MILK OF MAGNESIA) 400 MG/5ML suspension Take 5 mLs by mouth daily as needed for mild constipation.   Yes Historical Provider, MD   Physical Exam: Filed Vitals:   09/02/15 1300 09/02/15 1305 09/02/15 1306 09/02/15 1330  BP: 98/62 98/62    Pulse: 82   84 83  Temp: 98.3 F (36.8 C)   98.5 F (36.9 C)  TempSrc: Oral   Oral  Resp: _0 SpO2: 100%  100% 100%    Wt Readings from Last 3 Encounters:  06/09/15 63.504 kg (140 lb)  01/22/15 66.452 kg (146 lb 8 oz)  06/06/14 74.844 kg (165 lb)    Physical Exam  Constitutional: He is oriented to person, place, and time. No distress.  Thin, cachectic  HENT:  Head: Atraumatic.  Mouth/Throat: No oropharyngeal exudate.  Temporal wasting noted  Eyes: EOM are normal. Pupils are equal, round, and reactive to light. No scleral icterus.  Neck: No JVD present. No tracheal deviation present.  Cardiovascular: Normal rate and regular rhythm.  Exam reveals no gallop and no friction rub.   No murmur heard. Pulmonary/Chest: Effort normal and breath sounds normal. No stridor. No respiratory distress. He has no wheezes. He has no rales. He exhibits no tenderness.  Abdominal: Bowel sounds are normal. He exhibits mass. He exhibits no distension. There is tenderness.  Mid epigastric and LUQ tenderness on palpation  Musculoskeletal: Normal range of motion. He exhibits no edema or tenderness.  Lymphadenopathy:    He has no cervical adenopathy.  Neurological: He is alert and oriented to person, place, and time. He has normal reflexes. He exhibits normal muscle tone.  Skin: Skin is warm and dry. No rash noted. He is not diaphoretic. No erythema. There is pallor.  Psychiatric: Mood, memory, affect and judgment normal.            Labs on Admission:  Basic Metabolic Panel:  Recent Labs Lab 09/02/15 0920 09/02/15 0937  NA 133* 131*  K 4.8 4.4  CL 95* 93*  CO2 22  --   GLUCOSE 239* 244*  BUN 13 15  CREATININE 1.05 0.70  CALCIUM 8.7*  --     Liver Function Tests:  Recent Labs Lab 09/02/15 0920  AST 57*  ALT 17  ALKPHOS 72  BILITOT 0.9  PROT 5.3*  ALBUMIN 2.5*    Recent Labs Lab 09/02/15 0920  LIPASE 99*   No results for input(s): AMMONIA in the last 168  hours.  CBC:  Recent Labs Lab 09/02/15 0920 09/02/15 0937  WBC 17.9*  --   NEUTROABS 14.6*  --   HGB 8.3* 8.8*  HCT 25.0* 26.0*  MCV 92.6  --   PLT 358  --     Cardiac Enzymes: No results for input(s): CKTOTAL, CKMB, CKMBINDEX, TROPONINI in the last 168 hours.  BNP (last 3 results) No results for input(s): BNP in the last 8760 hours.  ProBNP (last 3 results) No results for input(s): PROBNP in the last 8760 hours.   Creatinine clearance cannot be calculated (Unknown ideal weight.)  CBG:  Recent Labs Lab 09/02/15 0900  GLUCAP  166*    Radiological Exams on Admission: Ct Abdomen Pelvis W Contrast  09/02/2015  CLINICAL DATA:  Abdominal pain, discomfort for 2-3 days EXAM: CT ABDOMEN AND PELVIS WITH CONTRAST TECHNIQUE: Multidetector CT imaging of the abdomen and pelvis was performed using the standard protocol following bolus administration of intravenous contrast. CONTRAST:  147m OMNIPAQUE IOHEXOL 300 MG/ML  SOLN COMPARISON:  Ultrasound 01/02/2015.  CT 05/03/2013. FINDINGS: Lung bases are clear. No effusions. Heart is normal size. Coronary artery calcifications. Small hypodensities scattered throughout the liver, similar prior CT compatible with cysts. Spleen, adrenals are unremarkable. Small benign cyst in the lower pole of the right kidney. No hydronephrosis. Diffuse calcifications throughout the pancreas as seen on prior CT compatible with chronic calcific pancreatitis. In addition, there are inflammatory changes around the pancreas and in the left anterior para renal space compatible with acute pancreatitis. Fluid collection posterior to the pancreatic tail in the left upper quadrant measures up to 3.3 cm, likely developing pseudo cyst. Smaller adjacent 11 mm cystic area. Anterior to the pancreas is a solid-appearing mass measuring 5.6 x 4.6 cm. This was not present on prior study nor seen on prior ultrasound. Bowel grossly unremarkable. Small amount of free fluid in the pelvis.  No free air. No adenopathy. Urinary bladder is grossly unremarkable. Aorta is calcified, non aneurysmal. IMPRESSION: Changes of calcific pancreatitis. Inflammatory changes around the pancreas compatible with acute pancreatitis. 3.3 cm an 11 mm cystic areas adjacent to the pancreatic tail compatible with pseudocysts. 5.6 cm solid-appearing mass anterior to the pancreas. This conceivably could represent a hemorrhagic pseudo cyst, but a solid pancreatic mass or mass arising from adjacent structures cannot be completely excluded such as gist tumor. This should be further evaluated with MRI with and without contrast after acute symptoms abate and patient can tolerate the study. Hepatic cysts. Electronically Signed   By: KRolm BaptiseM.D.   On: 09/02/2015 11:28   Dg Chest Port 1 View  09/02/2015  CLINICAL DATA:  Weakness.  Abdominal pain, rectal bleeding, nausea. EXAM: PORTABLE CHEST 1 VIEW COMPARISON:  07/25/2012 FINDINGS: The heart size and mediastinal contours are within normal limits. Both lungs are clear. The visualized skeletal structures are unremarkable. IMPRESSION: No active disease. Electronically Signed   By: KRolm BaptiseM.D.   On: 09/02/2015 10:00        Assessment/Plan Principal Problem:   UGI bleed Active Problems:   Cirrhosis (HCC)   Alcoholic pancreatitis   Protein-calorie malnutrition, severe (HCC)   Abdominal pain in male   Alcohol abuse  Gastro Intestinal Bleed Likely (higher GI as is dark red blood per rectum) He has a history of GIB in the past. Baseline Hgb is 10-11 , current Hgb is 8.3. BUN is 15 Will type and screen, check serial CBCs, and transfuse 2 units now in preparation for endoscopy, and later for  for Hgb less than 8.  Placed on IV protonix. IVF NPO  IV Protonix and Octreotide [facility] ShapeGastroenterology has been consulted for EGD and  MRCP later today( after hemodynamically stable) No NSAIDs  History of pancreatitis in the setting of ETOH-Lipase 99, mildly  elevated LFTs, CT abd/ pelvisshowing inflammatory changes around the pancreas, compatible with acute pancreatitis. In addition, pseudocysts around in the pancreatic tail around 5.6 cm solid appearing mass anterior to the pancreas was noted. Hepatic cysts were seen as well.    -check triglycerides, amylase -IVFs, pain control -NPO for now until GI eval ETOH cessation program.   ETOH cirrhosis CT  abd/ pelvis showing Hepatic cysts  Check PT/INR. Anticipating elevated values   Due to  History of ETOH cirrhosis in the setting of stat procedure, he is to receive Vit k x 1 to minimize risk of bleeding   Malnutrition due to pancreatic pain, rule out malignancy, GIB, and ETOH abuse. Lost 10-15 lbs over the last month Albumin 2.5  Check PAB Nutrition evaluation while in hospital   Code Status: Full Code   DVT Prophylaxis: SCDs Family Communication: wife at bedside Disposition Plan: Pending Improvement. Admitted forstepdown. Expected greater than 48 hras   [facility] E,PA-C [facility] [email or web address] Password [email or web address]

## 2015-09-02 NOTE — ED Provider Notes (Signed)
CSN: [id number]     Arrival date & time 09/02/15  0845 History   First MD Initiated Contact with Patient 09/02/15 [phone]     Chief Complaint  Patient presents with  . Abdominal Pain  . Rectal Bleeding  . Near Syncope     (Consider location/radiation/quality/duration/timing/severity/associated sxs/prior Treatment) Patient is a 65 y.o. male presenting with abdominal pain, hematochezia, and near-syncope. The history is provided by the patient (Patient complains of abdominal pain for 3 or 4 days with blood per rectum dark red blood. Patient drinks about a gallon of wine a day).  Abdominal Pain Pain location:  LUQ Pain quality: aching   Pain radiates to:  Does not radiate Pain severity:  Moderate Onset quality:  Sudden Timing:  Constant Progression:  Worsening Chronicity:  Recurrent Associated symptoms: hematochezia   Associated symptoms: no chest pain, no cough, no diarrhea, no fatigue and no hematuria   Rectal Bleeding Associated symptoms: abdominal pain   Near Syncope Associated symptoms include abdominal pain. Pertinent negatives include no chest pain and no headaches.    Past Medical History  Diagnosis Date  . Intestine disorder     intestinal blockage  . History of stomach ulcers   . Pancreatitis   . GERD (gastroesophageal reflux disease)     occasional   . Arthritis     left shoulder   . Cirrhosis ([place])     hx of   . Swelling of joint of right knee     05-11-14 has informed DR., awaitng for instructions-denies warmth, has intrmittent pain or discomfort   Past Surgical History  Procedure Laterality Date  . Inguinal hernia repair Right 09/29/2012    Procedure: HERNIA REPAIR INGUINAL ADULT;  Surgeon: Imogene Burn. Tsuei, MD;  Location: [facility];  Service: General;  Laterality: Right;  . Umbilical hernia repair N/A 09/29/2012    Procedure: HERNIA REPAIR UMBILICAL ADULT;  Surgeon: Imogene Burn. Georgette Dover, MD;  Location: [facility];  Service: General;  Laterality: N/A;  . Insertion of mesh  Right 09/29/2012    Procedure: INSERTION OF MESH;  Surgeon: Imogene Burn. Georgette Dover, MD;  Location: [facility];  Service: General;  Laterality: Right;  . Eus N/A 07/13/2013    Procedure: ESOPHAGEAL ENDOSCOPIC ULTRASOUND (EUS) RADIAL;  Surgeon: Arta Silence, MD;  Location: [facility];  Service: Endoscopy;  Laterality: N/A;   Family History  Problem Relation Age of Onset  . Hypertension Father    Social History  Substance Use Topics  . Smoking status: Light Tobacco Smoker -- 0.25 packs/day for 40 years    Types: Cigarettes  . Smokeless tobacco: Never Used     Comment: 2-3 cigarettes per day  . Alcohol Use: Yes     Comment: states stopped 3 months ago, none since    Review of Systems  Constitutional: Negative for appetite change and fatigue.  HENT: Negative for congestion, ear discharge and sinus pressure.   Eyes: Negative for discharge.  Respiratory: Negative for cough.   Cardiovascular: Positive for near-syncope. Negative for chest pain.  Gastrointestinal: Positive for abdominal pain and hematochezia. Negative for diarrhea.  Genitourinary: Negative for frequency and hematuria.  Musculoskeletal: Negative for back pain.  Skin: Negative for rash.  Neurological: Negative for seizures and headaches.  Psychiatric/Behavioral: Negative for hallucinations.      Allergies  Review of patient's allergies indicates no known allergies.  Home Medications   Prior to Admission medications   Medication Sig Start Date End Date Taking? Authorizing Provider  acetaminophen (TYLENOL) 325 MG tablet  Take 650 mg by mouth every 6 (six) hours as needed (pain).   Yes Historical Provider, MD  bismuth subsalicylate (PEPTO BISMOL) 262 MG/15ML suspension Take 30 mLs by mouth every 6 (six) hours as needed for indigestion or diarrhea or loose stools.   Yes Historical Provider, MD  ibuprofen (ADVIL,MOTRIN) 200 MG tablet Take 200 mg by mouth every 6 (six) hours as needed for mild pain.   Yes Historical Provider, MD   magnesium hydroxide (MILK OF MAGNESIA) 400 MG/5ML suspension Take 5 mLs by mouth daily as needed for mild constipation.   Yes Historical Provider, MD   BP 114/74 mmHg  Pulse 90  Temp(Src) 97.6 F (36.4 C) (Oral)  Resp 16  SpO2 100% Physical Exam  Constitutional: He is oriented to person, place, and time. He appears well-developed.  HENT:  Head: Normocephalic.  Eyes: Conjunctivae and EOM are normal. No scleral icterus.  Neck: Neck supple. No thyromegaly present.  Cardiovascular: Normal rate and regular rhythm.  Exam reveals no gallop and no friction rub.   No murmur heard. Pulmonary/Chest: No stridor. He has no wheezes. He has no rales. He exhibits no tenderness.  Abdominal: He exhibits no distension. There is tenderness. There is no rebound.  Moderate left upper quadrant tenderness  Genitourinary:  Rectal exam dark red stool heme positive  Musculoskeletal: Normal range of motion. He exhibits no edema.  Lymphadenopathy:    He has no cervical adenopathy.  Neurological: He is oriented to person, place, and time. He exhibits normal muscle tone. Coordination normal.  Skin: No rash noted. No erythema.  Psychiatric: He has a normal mood and affect. His behavior is normal.    ED Course  Procedures (including critical care time) Labs Review Labs Reviewed  COMPREHENSIVE METABOLIC PANEL - Abnormal; Notable for the following:    Sodium 133 (*)    Chloride 95 (*)    Glucose, Bld 239 (*)    Calcium 8.7 (*)    Total Protein 5.3 (*)    Albumin 2.5 (*)    AST 57 (*)    Anion gap 16 (*)    All other components within normal limits  LIPASE, BLOOD - Abnormal; Notable for the following:    Lipase 99 (*)    All other components within normal limits  CBC WITH DIFFERENTIAL/PLATELET - Abnormal; Notable for the following:    WBC 17.9 (*)    RBC 2.70 (*)    Hemoglobin 8.3 (*)    HCT 25.0 (*)    Neutro Abs 14.6 (*)    Monocytes Absolute 2.0 (*)    All other components within normal limits   RETICULOCYTES - Abnormal; Notable for the following:    RBC. 2.70 (*)    All other components within normal limits  POC OCCULT BLOOD, ED - Abnormal; Notable for the following:    Fecal Occult Bld POSITIVE (*)    All other components within normal limits  CBG MONITORING, ED - Abnormal; Notable for the following:    Glucose-Capillary 166 (*)    All other components within normal limits  I-STAT CHEM 8, ED - Abnormal; Notable for the following:    Sodium 131 (*)    Chloride 93 (*)    Glucose, Bld 244 (*)    Calcium, Ion 1.05 (*)    Hemoglobin 8.8 (*)    HCT 26.0 (*)    All other components within normal limits  I-STAT CG4 LACTIC ACID, ED - Abnormal; Notable for the following:    Lactic  Acid, Venous 5.28 (*)    All other components within normal limits  URINALYSIS, ROUTINE W REFLEX MICROSCOPIC (NOT AT [facility])  VITAMIN B12  FOLATE  IRON AND TIBC  FERRITIN  OCCULT BLOOD X 1 CARD TO LAB, STOOL  ETHANOL  I-STAT CG4 LACTIC ACID, ED  TYPE AND SCREEN  ABO/RH    Imaging Review Ct Abdomen Pelvis W Contrast  09/02/2015  CLINICAL DATA:  Abdominal pain, discomfort for 2-3 days EXAM: CT ABDOMEN AND PELVIS WITH CONTRAST TECHNIQUE: Multidetector CT imaging of the abdomen and pelvis was performed using the standard protocol following bolus administration of intravenous contrast. CONTRAST:  134mL OMNIPAQUE IOHEXOL 300 MG/ML  SOLN COMPARISON:  Ultrasound 01/02/2015.  CT 05/03/2013. FINDINGS: Lung bases are clear. No effusions. Heart is normal size. Coronary artery calcifications. Small hypodensities scattered throughout the liver, similar prior CT compatible with cysts. Spleen, adrenals are unremarkable. Small benign cyst in the lower pole of the right kidney. No hydronephrosis. Diffuse calcifications throughout the pancreas as seen on prior CT compatible with chronic calcific pancreatitis. In addition, there are inflammatory changes around the pancreas and in the left anterior para renal space compatible  with acute pancreatitis. Fluid collection posterior to the pancreatic tail in the left upper quadrant measures up to 3.3 cm, likely developing pseudo cyst. Smaller adjacent 11 mm cystic area. Anterior to the pancreas is a solid-appearing mass measuring 5.6 x 4.6 cm. This was not present on prior study nor seen on prior ultrasound. Bowel grossly unremarkable. Small amount of free fluid in the pelvis. No free air. No adenopathy. Urinary bladder is grossly unremarkable. Aorta is calcified, non aneurysmal. IMPRESSION: Changes of calcific pancreatitis. Inflammatory changes around the pancreas compatible with acute pancreatitis. 3.3 cm an 11 mm cystic areas adjacent to the pancreatic tail compatible with pseudocysts. 5.6 cm solid-appearing mass anterior to the pancreas. This conceivably could represent a hemorrhagic pseudo cyst, but a solid pancreatic mass or mass arising from adjacent structures cannot be completely excluded such as gist tumor. This should be further evaluated with MRI with and without contrast after acute symptoms abate and patient can tolerate the study. Hepatic cysts. Electronically Signed   By: Rolm Baptise M.D.   On: 09/02/2015 11:28   Dg Chest Port 1 View  09/02/2015  CLINICAL DATA:  Weakness.  Abdominal pain, rectal bleeding, nausea. EXAM: PORTABLE CHEST 1 VIEW COMPARISON:  07/25/2012 FINDINGS: The heart size and mediastinal contours are within normal limits. Both lungs are clear. The visualized skeletal structures are unremarkable. IMPRESSION: No active disease. Electronically Signed   By: Rolm Baptise M.D.   On: 09/02/2015 10:00   I have personally reviewed and evaluated these images and lab results as part of my medical decision-making.   EKG Interpretation None     CRITICAL CARE Performed by: Kalib Bhagat L Total critical care time: 35 minutes Critical care time was exclusive of separately billable procedures and treating other patients. Critical care was necessary to treat or  prevent imminent or life-threatening deterioration. Critical care was time spent personally by me on the following activities: development of treatment plan with patient and/or surrogate as well as nursing, discussions with consultants, evaluation of patient's response to treatment, examination of patient, obtaining history from patient or surrogate, ordering and performing treatments and interventions, ordering and review of laboratory studies, ordering and review of radiographic studies, pulse oximetry and re-evaluation of patient's condition.   MDM   Final diagnoses:  Abdominal pain in male    Patient with upper GI  bleed initially hypotensive but resolved with normal saline. Also patient has pancreatitis and pseudocysts. Patient will be admitted to stepdown by medicine with consult by GI    Milton Ferguson, MD 09/02/15 1220

## 2015-09-02 NOTE — Consult Note (Signed)
Consultation  Referring Provider:      Primary Care Physician:  Marcy Salvo, NP Primary Gastroenterologist:        None currently - patient previously followed by Dr. Paulita Fujita of [facility] Reason for Consultation:   GI bleed, pancreatic pseudocyst           HPI:   Alexander Schmitt is a 65 y.o. male male with history of alcoholism and suspected cirrhosis, pancreatitis with pseudocysts, peptic ulcer disease with history of reported GI bleeds, presenting with 2-3 days worth of epigastric to LUQ pain along with blood in his stools. He reports his stools have been "black mixed with red blood", having 3 BMs per day like this for the past 2-3 days. He reports he has not been able to tolerate anything PO due to his pain. He is not vomiting. He drinks a gallon of wine per day. His last drink was a week ago. He is not aware of a history of esophageal varices but he has no recollection of his last endoscopies. He has never had a prior colonoscopy. Thus far in the ER he has received 2 L IVF bolus and now getting unit of PRBC x 2. He is on protonix drip with octreotide pending. He has prescriptions for NSAID use although unclear how often he uses them. He has lost 10-15 lbs in the past few months and is not trying to lose weight. CT done in the ER showing 5.6cm mass vs. Cystic structure anterior to the pancreas, with a 3cm cyst at the tail of the pancreas, along with changes consistent with chronic pancreatitis.      Past Medical History  Diagnosis Date  . Intestine disorder     intestinal blockage  . History of stomach ulcers   . Pancreatitis   . GERD (gastroesophageal reflux disease)     occasional   . Arthritis     left shoulder   . Cirrhosis ([place])     hx of   . Swelling of joint of right knee     05-11-14 has informed DR., awaitng for instructions-denies warmth, has intrmittent pain or discomfort    Past Surgical History  Procedure Laterality Date  . Inguinal hernia repair Right 09/29/2012     Procedure: HERNIA REPAIR INGUINAL ADULT;  Surgeon: Imogene Burn. Tsuei, MD;  Location: [facility];  Service: General;  Laterality: Right;  . Umbilical hernia repair N/A 09/29/2012    Procedure: HERNIA REPAIR UMBILICAL ADULT;  Surgeon: Imogene Burn. Georgette Dover, MD;  Location: [facility];  Service: General;  Laterality: N/A;  . Insertion of mesh Right 09/29/2012    Procedure: INSERTION OF MESH;  Surgeon: Imogene Burn. Georgette Dover, MD;  Location: [facility];  Service: General;  Laterality: Right;  . Eus N/A 07/13/2013    Procedure: ESOPHAGEAL ENDOSCOPIC ULTRASOUND (EUS) RADIAL;  Surgeon: Arta Silence, MD;  Location: [facility];  Service: Endoscopy;  Laterality: N/A;    Family History  Problem Relation Age of Onset  . Hypertension Father      Social History  Substance Use Topics  . Smoking status: Light Tobacco Smoker -- 0.25 packs/day for 40 years    Types: Cigarettes  . Smokeless tobacco: Never Used     Comment: 2-3 cigarettes per day  . Alcohol Use: Yes     Comment: states stopped 3 months ago, none since  Alcohol use as outlined above - significant wine use daily (gallon)  Prior to Admission medications   Medication Sig Start Date End  Date Taking? Authorizing Provider  acetaminophen (TYLENOL) 325 MG tablet Take 650 mg by mouth every 6 (six) hours as needed (pain).   Yes Historical Provider, MD  bismuth subsalicylate (PEPTO BISMOL) 262 MG/15ML suspension Take 30 mLs by mouth every 6 (six) hours as needed for indigestion or diarrhea or loose stools.   Yes Historical Provider, MD  ibuprofen (ADVIL,MOTRIN) 200 MG tablet Take 200 mg by mouth every 6 (six) hours as needed for mild pain.   Yes Historical Provider, MD  magnesium hydroxide (MILK OF MAGNESIA) 400 MG/5ML suspension Take 5 mLs by mouth daily as needed for mild constipation.   Yes Historical Provider, MD    Current Facility-Administered Medications  Medication Dose Route Frequency Provider Last Rate Last Dose  . acetaminophen (TYLENOL) tablet 650 mg  650  mg Oral Once Rondel Jumbo, PA-C   650 mg at 09/02/15 1300  . diphenhydrAMINE (BENADRYL) capsule 25 mg  25 mg Oral Once Rondel Jumbo, PA-C   25 mg at 09/02/15 1300  . folic acid (FOLVITE) tablet 1 mg  1 mg Oral Daily Rondel Jumbo, PA-C   1 mg at 09/02/15 1411  . LORazepam (ATIVAN) tablet 1 mg  1 mg Oral Q6H PRN Rondel Jumbo, PA-C       Or  . LORazepam (ATIVAN) injection 1 mg  1 mg Intravenous Q6H PRN Rondel Jumbo, PA-C      . multivitamin with minerals tablet 1 tablet  1 tablet Oral Daily Rondel Jumbo, PA-C   1 tablet at 09/02/15 1411  . octreotide (SANDOSTATIN) 500 mcg in sodium chloride 0.9 % 250 mL (2 mcg/mL) infusion  50 mcg/hr Intravenous Continuous Rondel Jumbo, PA-C      . pantoprazole (PROTONIX) 80 mg in sodium chloride 0.9 % 250 mL (0.32 mg/mL) infusion  8 mg/hr Intravenous Continuous Reyne Dumas, MD 25 mL/hr at 09/02/15 1409 8 mg/hr at 09/02/15 1409  . [START ON 09/06/2015] pantoprazole (PROTONIX) injection 40 mg  40 mg Intravenous Q12H Reyne Dumas, MD      . phytonadione (VITAMIN K) 5 mg in dextrose 5 % 50 mL IVPB  5 mg Intravenous Once Reyne Dumas, MD      . thiamine (VITAMIN B-1) tablet 100 mg  100 mg Oral Daily Rondel Jumbo, PA-C       Or  . thiamine (B-1) injection 100 mg  100 mg Intravenous Daily Rondel Jumbo, PA-C       Current Outpatient Prescriptions  Medication Sig Dispense Refill  . acetaminophen (TYLENOL) 325 MG tablet Take 650 mg by mouth every 6 (six) hours as needed (pain).    . bismuth subsalicylate (PEPTO BISMOL) 262 MG/15ML suspension Take 30 mLs by mouth every 6 (six) hours as needed for indigestion or diarrhea or loose stools.    Marland Kitchen ibuprofen (ADVIL,MOTRIN) 200 MG tablet Take 200 mg by mouth every 6 (six) hours as needed for mild pain.    . magnesium hydroxide (MILK OF MAGNESIA) 400 MG/5ML suspension Take 5 mLs by mouth daily as needed for mild constipation.      Allergies as of 09/02/2015  . (No Known Allergies)     Review of Systems:    As  per HPI, otherwise negative    Physical Exam:  Vital signs in last 24 hours: Temp:  [97.6 F (36.4 C)-98.5 F (36.9 C)] 98.4 F (36.9 C) (02/26 1400) Pulse Rate:  [80-115] 80 (02/26 1445) Resp:  [14-23] 14 (02/26 1445) BP: (95-114)/(55-74) 95/66  mmHg (02/26 1445) SpO2:  [99 %-100 %] 100 % (02/26 1445)   General:   [place] AA male sitting in bed Head:  Normocephalic and atraumatic. Eyes:   No icterus.   Conjunctiva pale. Ears:  Normal auditory acuity. Neck:  Supple Lungs:  Respirations even and unlabored. Lungs clear to auscultation bilaterally Heart:  Regular rate and rhythm; no MRG Abdomen:  Soft, nondistended, mild epigastric TTP without rebound or guarding. Normal bowel sounds. No appreciable masses or hepatomegaly.  Rectal:  Dark stool with red blood per ER Msk:  Symmetrical without gross deformities.  Extremities:  Without edema. Neurologic:  Alert and  oriented x4;  grossly normal neurologically. Skin:  Intact without significant lesions or rashes. Psych:  Alert and cooperative. Normal affect.  LAB RESULTS:  Recent Labs  09/02/15 0920 09/02/15 0937  WBC 17.9*  --   HGB 8.3* 8.8*  HCT 25.0* 26.0*  PLT 358  --    BMET  Recent Labs  09/02/15 0920 09/02/15 0937  NA 133* 131*  K 4.8 4.4  CL 95* 93*  CO2 22  --   GLUCOSE 239* 244*  BUN 13 15  CREATININE 1.05 0.70  CALCIUM 8.7*  --    LFT  Recent Labs  09/02/15 0920  PROT 5.3*  ALBUMIN 2.5*  AST 57*  ALT 17  ALKPHOS 72  BILITOT 0.9   PT/INR No results for input(s): LABPROT, INR in the last 72 hours.  STUDIES: Ct Abdomen Pelvis W Contrast  09/02/2015  CLINICAL DATA:  Abdominal pain, discomfort for 2-3 days EXAM: CT ABDOMEN AND PELVIS WITH CONTRAST TECHNIQUE: Multidetector CT imaging of the abdomen and pelvis was performed using the standard protocol following bolus administration of intravenous contrast. CONTRAST:  158mL OMNIPAQUE IOHEXOL 300 MG/ML  SOLN COMPARISON:  Ultrasound 01/02/2015.  CT  05/03/2013. FINDINGS: Lung bases are clear. No effusions. Heart is normal size. Coronary artery calcifications. Small hypodensities scattered throughout the liver, similar prior CT compatible with cysts. Spleen, adrenals are unremarkable. Small benign cyst in the lower pole of the right kidney. No hydronephrosis. Diffuse calcifications throughout the pancreas as seen on prior CT compatible with chronic calcific pancreatitis. In addition, there are inflammatory changes around the pancreas and in the left anterior para renal space compatible with acute pancreatitis. Fluid collection posterior to the pancreatic tail in the left upper quadrant measures up to 3.3 cm, likely developing pseudo cyst. Smaller adjacent 11 mm cystic area. Anterior to the pancreas is a solid-appearing mass measuring 5.6 x 4.6 cm. This was not present on prior study nor seen on prior ultrasound. Bowel grossly unremarkable. Small amount of free fluid in the pelvis. No free air. No adenopathy. Urinary bladder is grossly unremarkable. Aorta is calcified, non aneurysmal. IMPRESSION: Changes of calcific pancreatitis. Inflammatory changes around the pancreas compatible with acute pancreatitis. 3.3 cm an 11 mm cystic areas adjacent to the pancreatic tail compatible with pseudocysts. 5.6 cm solid-appearing mass anterior to the pancreas. This conceivably could represent a hemorrhagic pseudo cyst, but a solid pancreatic mass or mass arising from adjacent structures cannot be completely excluded such as gist tumor. This should be further evaluated with MRI with and without contrast after acute symptoms abate and patient can tolerate the study. Hepatic cysts. Electronically Signed   By: Rolm Baptise M.D.   On: 09/02/2015 11:28   Dg Chest Port 1 View  09/02/2015  CLINICAL DATA:  Weakness.  Abdominal pain, rectal bleeding, nausea. EXAM: PORTABLE CHEST 1 VIEW COMPARISON:  07/25/2012 FINDINGS:  The heart size and mediastinal contours are within normal limits.  Both lungs are clear. The visualized skeletal structures are unremarkable. IMPRESSION: No active disease. Electronically Signed   By: Rolm Baptise M.D.   On: 09/02/2015 10:00     PREVIOUS ENDOSCOPIES:             EUS with Dr. Paulita Fujita in 2015 with changes consistent with chronic pancreatitis.    Impression / Plan:  65 y/o male with history of alcohol use with suspected cirrhosis, chronic pancreatitis with history of pseudocysts, and prior GI bleeding, presenting to the ER with 2-3 days worth of abdominal pain, decreased PO intake, and passing "black stools with red blood". He was anemic on presentation with hypotension and tachycardia which improved with IVF and PRBC transfusion. I am currently awaiting his coagulation studies which are pending. BUN at 15 but is actually high for this patient who normally has an undetectably low BUN. Given history of PUD with bleeding and his melena in the setting of liver disease, need to rule out upper tract source initially and recommend EGD now to rule out variceal bleed, PUD, etc. He will be transfused with PRBC and given IV protonix and octreotide until EGD is done. I have counseled him on the risks of endoscopy to include cardiopulmonary compromise, bleeding, perforation, etc, and he agrees to proceed. I think he is hemodynamically stable for endoscopy at this time as his vitals have responded to blood products. Will give FFP if needed pending results of INR. He should also receive Rocephin given reported history of cirrhosis with GI bleeding.   Otherwise, CT scan images as above, and the differential for this includes a pseudocyst or perhaps pseudoaneurysm which can rarely present with GI bleeding such as this. If the EGD is negative for bleeding, we will need to proceed with a tagged RBC scan. Otherwise, once his acute bleeding has been addressed and he is stablized, I agree with either MRCP or EUS to further evaluate lesion anterior to the pancreas. Further  recommendations pending EGD result.  [place] Cellar, MD [facility] Pager [phone]    LOS: 0 days   Renelda Loma Kadian Barcellos  09/02/2015, 3:05 PM

## 2015-09-02 NOTE — ED Notes (Signed)
Pt from home with c/o abdominal pain and rectal bleeding x "a couple of days" with nausea.  Pt reports falling several times today from being so weak.  Denies V/D.  NAD, A&O.

## 2015-09-02 NOTE — Op Note (Signed)
[facility] [place], [address]   ENDOSCOPY PROCEDURE REPORT  PATIENT: Alexander, Schmitt  MR#: [id number] BIRTHDATE: Jan 27, 1951 , 64  yrs. old GENDER: male ENDOSCOPIST: Yetta Flock, MD REFERRED BY: PROCEDURE DATE:  09/02/2015 PROCEDURE:  EGD, diagnostic ASA CLASS:     Class III INDICATIONS:  GI bleed, history of pancreatitis / pseudocyst and alcoholic liver disease. MEDICATIONS: Fentanyl 75 mcg IV, Benadryl 50 mg IV, and Versed 4 mg IV TOPICAL ANESTHETIC:  DESCRIPTION OF PROCEDURE: After the risks benefits and alternatives of the procedure were thoroughly explained, informed consent was obtained.  The PENTAX GASTOROSCOPE [id number] endoscope was introduced through the mouth and advanced to the second portion of the duodenum , Without limitations.  The instrument was slowly withdrawn as the mucosa was fully examined.    The esophagus was normal in appearance, there were no esophageal varices.  The DH, GEJ, and SCJ were noted 38cm from the incisors. The stomach was remarkable for suspected gastric varices in the findus however no active bleeding or stigmata of bleeding was noted.  The remainder of the examined stomach appeared normal without focal ulcerations or erosions.  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  The 2nd portion of the duodenum appeared normal.  I suspect I saw the ampulla and did not see any bleeding, although it is possible this was not the ampulla and possibly adenomatous tissue.  Biopsies not taken given his recent bleeding to not confound anything.  There was no evidence of old or fresh blood noted in the stomach.  Retroflexed views revealed as previously described.     The scope was then withdrawn from the patient and the procedure completed.  COMPLICATIONS: There were no immediate complications.  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 normal ampulla - would consider repeat EGD once acute issues have resolved to biopsy  Overall, no active bleeding on this exam. Unclear if his prior bleeding was due to gastric varices, possible pancreatic pseudocyst/pseudoaneurym  vs. other small bowel or colonic source    RECOMMENDATIONS: Continue NPO Continue IV octreotide and protonix Serial H/H, transfuse PRBC if needed Please contact us with any evidence of rebleeding in which case a tagged RBC scan should be ordered Interventional radiology has been contacted about this patient in case he rebleeds Continue Rocephin GI service will continue to follow    eSigned:  Yetta Flock, MD 09/02/2015 6:04 PM    CC: the patient  PATIENT NAME:  Alexander, Schmitt MR#: [id number]

## 2015-09-03 ENCOUNTER — Encounter (HOSPITAL_COMMUNITY): Payer: Self-pay | Admitting: Gastroenterology

## 2015-09-03 DIAGNOSIS — K7030 Alcoholic cirrhosis of liver without ascites: Secondary | ICD-10-CM

## 2015-09-03 DIAGNOSIS — R935 Abnormal findings on diagnostic imaging of other abdominal regions, including retroperitoneum: Secondary | ICD-10-CM | POA: Insufficient documentation

## 2015-09-03 DIAGNOSIS — I864 Gastric varices: Secondary | ICD-10-CM | POA: Insufficient documentation

## 2015-09-03 DIAGNOSIS — K860 Alcohol-induced chronic pancreatitis: Secondary | ICD-10-CM

## 2015-09-03 DIAGNOSIS — K869 Disease of pancreas, unspecified: Secondary | ICD-10-CM

## 2015-09-03 DIAGNOSIS — D62 Acute posthemorrhagic anemia: Secondary | ICD-10-CM

## 2015-09-03 DIAGNOSIS — K863 Pseudocyst of pancreas: Secondary | ICD-10-CM

## 2015-09-03 DIAGNOSIS — K8689 Other specified diseases of pancreas: Secondary | ICD-10-CM | POA: Insufficient documentation

## 2015-09-03 LAB — CBC
HCT: 24.5 % — ABNORMAL LOW (ref 39.0–52.0)
HCT: 25.3 % — ABNORMAL LOW (ref 39.0–52.0)
HEMOGLOBIN: 8.1 g/dL — AB (ref 13.0–17.0)
Hemoglobin: 8.5 g/dL — ABNORMAL LOW (ref 13.0–17.0)
MCH: 30.1 pg (ref 26.0–34.0)
MCH: 30.4 pg (ref 26.0–34.0)
MCHC: 33.1 g/dL (ref 30.0–36.0)
MCHC: 33.6 g/dL (ref 30.0–36.0)
MCV: 91.1 fL (ref 78.0–100.0)
MCV: 90.4 fL (ref 78.0–100.0)
PLATELETS: 186 10*3/uL (ref 150–400)
Platelets: 198 10*3/uL (ref 150–400)
RBC: 2.69 MIL/uL — AB (ref 4.22–5.81)
RBC: 2.8 MIL/uL — ABNORMAL LOW (ref 4.22–5.81)
RDW: 13.9 % (ref 11.5–15.5)
RDW: 13.7 % (ref 11.5–15.5)
WBC: 10.4 10*3/uL (ref 4.0–10.5)
WBC: 10.3 10*3/uL (ref 4.0–10.5)

## 2015-09-03 MED ORDER — MAGNESIUM HYDROXIDE 400 MG/5ML PO SUSP
30.0000 mL | Freq: Every day | ORAL | Status: DC | PRN
  Administered 2015-09-05: 30 mL via ORAL
  Filled 2015-09-03: qty 30

## 2015-09-03 MED ORDER — MORPHINE SULFATE (PF) 2 MG/ML IV SOLN: 1.0000 mg | INTRAVENOUS | Status: DC | PRN

## 2015-09-03 MED ORDER — PANTOPRAZOLE SODIUM 40 MG PO TBEC
40.0000 mg | DELAYED_RELEASE_TABLET | Freq: Every day | ORAL | Status: DC
  Administered 2015-09-04 – 2015-09-06 (×3): 40 mg via ORAL
  Filled 2015-09-03 (×3): qty 1

## 2015-09-03 NOTE — Progress Notes (Signed)
PATIENT DETAILS Name: Alexander Schmitt Age: 65 y.o. Sex: male Date of Birth: [birth year] Admit Date: 09/02/2015 Admitting Physician Reyne Dumas, MD [id number], Sharyn Lull, NP  Subjective: No hematochezia/melena overnight.  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 Bleeding seems to have slowed down significantly. GI consulted, underwent EGD on 2/26 which did not show any stigmata of bleeding. GI is following, await further recommendations.  Active Problems: Acute blood loss anemia: Secondary to above, hemoglobin stable following 3 units of PRBC on admission. Continue to follow CBC and transfuse as needed.  5.6 cm solid mass anterior to the pancreas: Likely pseudocysts-need MRI or EUS when more stable.  Chronic pancreatitis with Pancreatic pseudocysts: Supportive care for now, EUS/MRCP per GI when more stable  Presumed alcoholic cirrhosis: Currently appears stable, no evidence of ascites. Follow.  Alcohol abuse: last drink one week prior to this admission. No evidence of withdrawal at this time.  Severe malnutrition: Nutrition evaluation  Disposition: Remain inpatient  Antimicrobial agents  See below  Anti-infectives    Start     Dose/Rate Route Frequency Ordered Stop   09/02/15 1830  cefTRIAXone (ROCEPHIN) 1 g in dextrose 5 % 50 mL IVPB     1 g 100 mL/hr over 30 Minutes Intravenous Every 24 hours 09/02/15 1808        DVT Prophylaxis:  SCD's  Code Status: Full code   Family Communication Spouse at bedside  Procedures: 2/26>> EGD  CONSULTS:  GI  Time spent 30 minutes-Greater than 50% of this time was spent in counseling, explanation of diagnosis, planning of further management, and coordination of care.  MEDICATIONS: Scheduled Meds: . acetaminophen  650 mg Oral Once  . cefTRIAXone (ROCEPHIN)  IV  1 g Intravenous Q24H  . diphenhydrAMINE  25 mg Oral Once  . folic acid  1 mg Oral Daily  .  multivitamin with minerals  1 tablet Oral Daily  . [START ON 09/06/2015] pantoprazole (PROTONIX) IV  40 mg Intravenous Q12H  . thiamine  100 mg Oral Daily   Continuous Infusions: . octreotide  (SANDOSTATIN)    IV infusion 50 mcg/hr (09/03/15 1221)  . pantoprozole (PROTONIX) infusion 8 mg/hr (09/03/15 1221)   PRN Meds:.LORazepam **OR** LORazepam, morphine injection    PHYSICAL EXAM: Vital signs in last 24 hours: Filed Vitals:   09/03/15 0800 09/03/15 0902 09/03/15 1000 09/03/15 1100  BP: 152/88 118/77 125/80 136/78  Pulse: 80 72 80 77  Temp:  97.3 F (36.3 C)    TempSrc:  Oral    Resp: 18 15 14 17   Height:      Weight:      SpO2: 100% 99% 99% 99%    Weight change:  Filed Weights   09/02/15 1807  Weight: 61.1 kg (134 lb 11.2 oz)   Body mass index is 18.26 kg/(m^2).   Gen Exam: Awake and alert with clear speech.   Neck: Supple, No JVD.   Chest: B/L Clear.   CVS: S1 S2 Regular, no murmurs.  Abdomen: soft, BS +, non tender, non distended.  Extremities: no edema, lower extremities warm to touch. Neurologic: Non Focal.   Skin: No Rash.   Wounds: N/A.   Intake/Output from previous day:  Intake/Output Summary (Last 24 hours) at 09/03/15 1248 Last data filed at 09/03/15 1000  Gross per 24 hour  Intake 5038.75 ml  Output    700 ml  Net 4338.75 ml  LAB RESULTS: CBC  Recent Labs Lab 09/02/15 0920 09/02/15 0937 09/02/15 1822 09/03/15 0206 09/03/15 0515  WBC 17.9*  --  11.8* 10.4 10.3  HGB 8.3* 8.8* 7.1* 8.1* 8.5*  HCT 25.0* 26.0* 22.0* 24.5* 25.3*  PLT 358  --  194 186 198  MCV 92.6  --  92.8 91.1 90.4  MCH 30.7  --  30.0 30.1 30.4  MCHC 33.2  --  32.3 33.1 33.6  RDW 13.9  --  14.2 13.9 13.7  LYMPHSABS 1.3  --   --   --   --   MONOABS 2.0*  --   --   --   --   EOSABS 0.0  --   --   --   --   BASOSABS 0.0  --   --   --   --     Chemistries   Recent Labs Lab 09/02/15 0920 09/02/15 0937  NA 133* 131*  K 4.8 4.4  CL 95* 93*  CO2 22  --   GLUCOSE  239* 244*  BUN 13 15  CREATININE 1.05 0.70  CALCIUM 8.7*  --     CBG:  Recent Labs Lab 09/02/15 0900  GLUCAP 166*    GFR Estimated Creatinine Clearance: 81.7 mL/min (by C-G formula based on Cr of 0.7).  Coagulation profile  Recent Labs Lab 09/02/15 1500  INR 1.40    Cardiac Enzymes No results for input(s): CKMB, TROPONINI, MYOGLOBIN in the last 168 hours.  Invalid input(s): CK  Invalid input(s): POCBNP No results for input(s): DDIMER in the last 72 hours. No results for input(s): HGBA1C in the last 72 hours. No results for input(s): CHOL, HDL, LDLCALC, TRIG, CHOLHDL, LDLDIRECT in the last 72 hours. No results for input(s): TSH, T4TOTAL, T3FREE, THYROIDAB in the last 72 hours.  Invalid input(s): FREET3  Recent Labs  09/02/15 0920 09/02/15 1822  VITAMINB12  --  163*  FOLATE  --  10.1  FERRITIN  --  179  TIBC  --  176*  IRON  --  16*  RETICCTPCT 2.7  --     Recent Labs  09/02/15 0920  LIPASE 99*    Urine Studies No results for input(s): UHGB, CRYS in the last 72 hours.  Invalid input(s): UACOL, UAPR, USPG, UPH, UTP, UGL, UKET, UBIL, UNIT, UROB, ULEU, UEPI, UWBC, URBC, UBAC, CAST, UCOM, BILUA  MICROBIOLOGY: Recent Results (from the past 240 hour(s))  MRSA PCR Screening     Status: None   Collection Time: 09/02/15  6:10 PM  Result Value Ref Range Status   MRSA by PCR NEGATIVE NEGATIVE Final    Comment:        The GeneXpert MRSA Assay (FDA approved for NASAL specimens only), is one component of a comprehensive MRSA colonization surveillance program. It is not intended to diagnose MRSA infection nor to guide or monitor treatment for MRSA infections.     RADIOLOGY STUDIES/RESULTS: Ct Abdomen Pelvis W Contrast  09/02/2015  CLINICAL DATA:  Abdominal pain, discomfort for 2-3 days EXAM: CT ABDOMEN AND PELVIS WITH CONTRAST TECHNIQUE: Multidetector CT imaging of the abdomen and pelvis was performed using the standard protocol following bolus  administration of intravenous contrast. CONTRAST:  113mL OMNIPAQUE IOHEXOL 300 MG/ML  SOLN COMPARISON:  Ultrasound 01/02/2015.  CT 05/03/2013. FINDINGS: Lung bases are clear. No effusions. Heart is normal size. Coronary artery calcifications. Small hypodensities scattered throughout the liver, similar prior CT compatible with cysts. Spleen, adrenals are unremarkable. Small benign cyst in the lower pole  of the right kidney. No hydronephrosis. Diffuse calcifications throughout the pancreas as seen on prior CT compatible with chronic calcific pancreatitis. In addition, there are inflammatory changes around the pancreas and in the left anterior para renal space compatible with acute pancreatitis. Fluid collection posterior to the pancreatic tail in the left upper quadrant measures up to 3.3 cm, likely developing pseudo cyst. Smaller adjacent 11 mm cystic area. Anterior to the pancreas is a solid-appearing mass measuring 5.6 x 4.6 cm. This was not present on prior study nor seen on prior ultrasound. Bowel grossly unremarkable. Small amount of free fluid in the pelvis. No free air. No adenopathy. Urinary bladder is grossly unremarkable. Aorta is calcified, non aneurysmal. IMPRESSION: Changes of calcific pancreatitis. Inflammatory changes around the pancreas compatible with acute pancreatitis. 3.3 cm an 11 mm cystic areas adjacent to the pancreatic tail compatible with pseudocysts. 5.6 cm solid-appearing mass anterior to the pancreas. This conceivably could represent a hemorrhagic pseudo cyst, but a solid pancreatic mass or mass arising from adjacent structures cannot be completely excluded such as gist tumor. This should be further evaluated with MRI with and without contrast after acute symptoms abate and patient can tolerate the study. Hepatic cysts. Electronically Signed   By: Rolm Baptise M.D.   On: 09/02/2015 11:28   Dg Chest Port 1 View  09/02/2015  CLINICAL DATA:  Weakness.  Abdominal pain, rectal bleeding,  nausea. EXAM: PORTABLE CHEST 1 VIEW COMPARISON:  07/25/2012 FINDINGS: The heart size and mediastinal contours are within normal limits. Both lungs are clear. The visualized skeletal structures are unremarkable. IMPRESSION: No active disease. Electronically Signed   By: Rolm Baptise M.D.   On: 09/02/2015 10:00    Oren Binet, MD  [facility] Pager:[phone]  If 7PM-7AM, please contact night-coverage [email or web address] Password [email or web address] 09/03/2015, 12:48 PM   LOS: 1 day

## 2015-09-03 NOTE — Progress Notes (Signed)
Utilization review complete. Elius Etheredge RN CCM Case Mgmt phone [phone] 

## 2015-09-03 NOTE — Progress Notes (Signed)
Daily Rounding Note  09/03/2015, 12:24 PM  LOS: 1 day   SUBJECTIVE:       No BMs or bleeding.  Tends to constipation and prn use of MOM.  No nausea.  Drinks fortified wine (wild irish rose) up to 3 pints daily.  No abdominal pain.  Eating regular diet for >24 hours.    OBJECTIVE:         Vital signs in last 24 hours:    Temp:  [97.3 F (36.3 C)-98.7 F (37.1 C)] 97.3 F (36.3 C) (02/27 0902) Pulse Rate:  [66-88] 77 (02/27 1100) Resp:  [11-19] 17 (02/27 1100) BP: (93-155)/(60-96) 136/78 mmHg (02/27 1100) SpO2:  [97 %-100 %] 99 % (02/27 1100) Weight:  [61.1 kg (134 lb 11.2 oz)] 61.1 kg (134 lb 11.2 oz) (02/26 1807) Last BM Date: 09/02/15 Filed Weights   09/02/15 1807  Weight: 61.1 kg (134 lb 11.2 oz)   General: pale, cachectic appearing   Heart: RRR Chest: clear bil.  No labored resps or cough Abdomen: soft, NT, no mass.  Active BS  Extremities: no CCE Neuro/Psych:  Oriented x 3.  Alert, appropriate.   Intake/Output from previous day: 02/26 0701 - 02/27 0700 In: 4598.8 [P.O.:480; I.V.:1083.8; Blood:335; IV Piggyback:2700] Out: 700 [Urine:700]  Intake/Output this shift: Total I/O In: 440 [P.O.:240; I.V.:200] Out: -   Lab Results:  Recent Labs  09/02/15 1822 09/03/15 0206 09/03/15 0515  WBC 11.8* 10.4 10.3  HGB 7.1* 8.1* 8.5*  HCT 22.0* 24.5* 25.3*  PLT 194 186 198   BMET  Recent Labs  09/02/15 0920 09/02/15 0937  NA 133* 131*  K 4.8 4.4  CL 95* 93*  CO2 22  --   GLUCOSE 239* 244*  BUN 13 15  CREATININE 1.05 0.70  CALCIUM 8.7*  --    LFT  Recent Labs  09/02/15 0920  PROT 5.3*  ALBUMIN 2.5*  AST 57*  ALT 17  ALKPHOS 72  BILITOT 0.9   PT/INR  Recent Labs  09/02/15 1500  LABPROT 17.3*  INR 1.40   Hepatitis Panel No results for input(s): HEPBSAG, HCVAB, HEPAIGM, HEPBIGM in the last 72 hours.  Studies/Results: Ct Abdomen Pelvis W Contrast  09/02/2015  CLINICAL DATA:   Abdominal pain, discomfort for 2-3 days EXAM: CT ABDOMEN AND PELVIS WITH CONTRAST TECHNIQUE: Multidetector CT imaging of the abdomen and pelvis was performed using the standard protocol following bolus administration of intravenous contrast. CONTRAST:  159mL OMNIPAQUE IOHEXOL 300 MG/ML  SOLN COMPARISON:  Ultrasound 01/02/2015.  CT 05/03/2013. FINDINGS: Lung bases are clear. No effusions. Heart is normal size. Coronary artery calcifications. Small hypodensities scattered throughout the liver, similar prior CT compatible with cysts. Spleen, adrenals are unremarkable. Small benign cyst in the lower pole of the right kidney. No hydronephrosis. Diffuse calcifications throughout the pancreas as seen on prior CT compatible with chronic calcific pancreatitis. In addition, there are inflammatory changes around the pancreas and in the left anterior para renal space compatible with acute pancreatitis. Fluid collection posterior to the pancreatic tail in the left upper quadrant measures up to 3.3 cm, likely developing pseudo cyst. Smaller adjacent 11 mm cystic area. Anterior to the pancreas is a solid-appearing mass measuring 5.6 x 4.6 cm. This was not present on prior study nor seen on prior ultrasound. Bowel grossly unremarkable. Small amount of free fluid in the pelvis. No free air. No adenopathy. Urinary bladder is grossly unremarkable. Aorta is calcified, non aneurysmal. IMPRESSION: Changes  of calcific pancreatitis. Inflammatory changes around the pancreas compatible with acute pancreatitis. 3.3 cm an 11 mm cystic areas adjacent to the pancreatic tail compatible with pseudocysts. 5.6 cm solid-appearing mass anterior to the pancreas. This conceivably could represent a hemorrhagic pseudo cyst, but a solid pancreatic mass or mass arising from adjacent structures cannot be completely excluded such as gist tumor. This should be further evaluated with MRI with and without contrast after acute symptoms abate and patient can  tolerate the study. Hepatic cysts. Electronically Signed   By: Rolm Baptise M.D.   On: 09/02/2015 11:28   Dg Chest Port 1 View  09/02/2015  CLINICAL DATA:  Weakness.  Abdominal pain, rectal bleeding, nausea. EXAM: PORTABLE CHEST 1 VIEW COMPARISON:  07/25/2012 FINDINGS: The heart size and mediastinal contours are within normal limits. Both lungs are clear. The visualized skeletal structures are unremarkable. IMPRESSION: No active disease. Electronically Signed   By: Rolm Baptise M.D.   On: 09/02/2015 10:00   Scheduled Meds: . acetaminophen  650 mg Oral Once  . cefTRIAXone (ROCEPHIN)  IV  1 g Intravenous Q24H  . diphenhydrAMINE  25 mg Oral Once  . folic acid  1 mg Oral Daily  . multivitamin with minerals  1 tablet Oral Daily  . [START ON 09/06/2015] pantoprazole (PROTONIX) IV  40 mg Intravenous Q12H  . thiamine  100 mg Oral Daily   Continuous Infusions: . octreotide  (SANDOSTATIN)    IV infusion 50 mcg/hr (09/03/15 1221)  . pantoprozole (PROTONIX) infusion 8 mg/hr (09/03/15 1221)   PRN Meds:.LORazepam **OR** LORazepam, morphine injection   ASSESMENT:   *  06/2015 acute, recurrent ETOH pancreatitis and early pseudocyst. Calcification c/w chronic pancreatitis. Solid appearing mass on 2/26 CT, ? Hemorrhagic pseudocyst/cn not rule out GIST.   *  Bleeding PR.  "Dark stool with red blood per ER" 09/02/15 EGD: suspected gastric varices, suspected benign ectopic gastric mucosa of the duodenal bulb, possible irregular mucosa in the duodenum vs. normal ampulla.  No active bleeding.   Day 2 Octreotide   *  Cirrhosis.  Hx ascites: 4 liter paracentesis 05/2012, no SBP.  Platelets normal.  PT increased to 17.3.    *  Alcoholism.  Not in remission.   *  Anemia, normocytic.  S/p PRBC x 2. ? If only due to GI blood loss? Hgb 06/2015: 13.0. Iron and iron sat low, ferritin 179.  B 12 low.      *  DM.  Glucose to 244.   *  SBO 11/2005.     PLAN   *  Await completion of the ordered MRI.  MRI confirms  this is on slate for today.   *  If recurrent re-bleeding: tagged RBC scan should be ordered.  Dr Havery Moros contacted IR for heads up in case he rebleeds  *  Given his age and hx BPR, should he undergo colonoscopy? Outpt?  *  Advised pt of seriousness of cirrhosis and pancreatitis and need to abstain from all ETOH.  Suggested he attend AA meetings (familiar with AA but not actively participating).   *  MOM for constipation.   *  Stop octreotide and PPI drip.  Start daily PPI.     Azucena Freed  09/03/2015, 12:24 PM Pager: [phone]  GI ATTENDING  Interval history and data reviewed. Case discussed with Dr. Havery Moros. Patient personally seen and examined. Complicated patient with complicated pancreatic disease (chronic pancreatitis secondary to alcohol) and possible underlying cirrhosis. Presents with GI bleeding. Upper endoscopy without  blood but did suggest isolated gastric varices. Though not on the CT scan report, Dr. 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 Other concerns would be pseudoaneurysm with hemosuccus pancreaticus. Cystic and possible solid lesions of pancreas need clarified. MRI ordered but not performed. Patient clinically stable this afternoon without leading or other particular complaints. Family member in room. Continue with supportive care. Plans for tagged red blood cell scan and possible angiography for recurrent bleeding. Await additional imaging. Could conceivably need EUS and surgery.  Docia Chuck. Geri Seminole., M.D. [facility] Division of Gastroenterology

## 2015-09-04 ENCOUNTER — Inpatient Hospital Stay (HOSPITAL_COMMUNITY): Payer: Medicaid Other

## 2015-09-04 DIAGNOSIS — I82890 Acute embolism and thrombosis of other specified veins: Secondary | ICD-10-CM | POA: Insufficient documentation

## 2015-09-04 DIAGNOSIS — K852 Alcohol induced acute pancreatitis without necrosis or infection: Secondary | ICD-10-CM

## 2015-09-04 DIAGNOSIS — Q8901 Asplenia (congenital): Secondary | ICD-10-CM | POA: Insufficient documentation

## 2015-09-04 DIAGNOSIS — R109 Unspecified abdominal pain: Secondary | ICD-10-CM | POA: Insufficient documentation

## 2015-09-04 DIAGNOSIS — K921 Melena: Secondary | ICD-10-CM | POA: Insufficient documentation

## 2015-09-04 LAB — CBC
HCT: 24.4 % — ABNORMAL LOW (ref 39.0–52.0)
Hemoglobin: 8.1 g/dL — ABNORMAL LOW (ref 13.0–17.0)
MCH: 30.2 pg (ref 26.0–34.0)
MCHC: 33.2 g/dL (ref 30.0–36.0)
MCV: 91 fL (ref 78.0–100.0)
PLATELETS: 253 10*3/uL (ref 150–400)
RBC: 2.68 MIL/uL — ABNORMAL LOW (ref 4.22–5.81)
RDW: 13.6 % (ref 11.5–15.5)
WBC: 7.2 10*3/uL (ref 4.0–10.5)

## 2015-09-04 LAB — COMPREHENSIVE METABOLIC PANEL
ALK PHOS: 57 U/L (ref 38–126)
ALT: 9 U/L — ABNORMAL LOW (ref 17–63)
ANION GAP: 7 (ref 5–15)
AST: 12 U/L — ABNORMAL LOW (ref 15–41)
Albumin: 2.1 g/dL — ABNORMAL LOW (ref 3.5–5.0)
BUN: <5 mg/dL — ABNORMAL LOW (ref 6–20)
CALCIUM: 8.2 mg/dL — AB (ref 8.9–10.3)
CHLORIDE: 102 mmol/L (ref 101–111)
CO2: 27 mmol/L (ref 22–32)
Creatinine, Ser: 0.69 mg/dL (ref 0.61–1.24)
GFR calc non Af Amer: >60 mL/min (ref >60)
Glucose, Bld: 107 mg/dL — ABNORMAL HIGH (ref 65–99)
Potassium: 3.6 mmol/L (ref 3.5–5.1)
SODIUM: 136 mmol/L (ref 135–145)
Total Bilirubin: 0.4 mg/dL (ref 0.3–1.2)
Total Protein: 4.5 g/dL — ABNORMAL LOW (ref 6.5–8.1)

## 2015-09-04 LAB — MAGNESIUM: Magnesium: 1.7 mg/dL (ref 1.7–2.4)

## 2015-09-04 MED ORDER — GADOBENATE DIMEGLUMINE 529 MG/ML IV SOLN
15.0000 mL | Freq: Once | INTRAVENOUS | Status: AC
Start: 2015-09-04 — End: 2015-09-04
  Administered 2015-09-04: 12 mL via INTRAVENOUS

## 2015-09-04 NOTE — Consult Note (Signed)
Date of Admission:  09/02/2015  Date of Consult:  09/04/2015  Reason for Consult: Recommendations for vaccines related to a patient with planned splenectomy Referring Physician: Dr. Henrene Pastor   HPI: Alexander Schmitt is an 65 y.o. male. Past mental history significant for alcoholism and pancreatitis pancreatic cysts liver cirrhosis who was admitted with 4 day history of progressive abdominal pain dark red blood per rectum and melena. He is admitted after being seen in the [facility] department. CT scan of the abdomen and pelvis showed inflammatory changes around the pancreas consistent with acute pancreatitis along with pseudocysts and a 5.6 cm solid mass and to the pancreas which could could represent a hemorrhagic pseudo cyst, but a solidpancreatic mass. He was treated with IV octreotide as well as proton pump inhibitors and underwent EGD which at that time did not show any further active though it showed gastric varices. 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 Dr. Henrene Pastor is concerned that the splenic vein thrombosis could be causing the gastric varices. The pancreatic eyebrow is a thought to be due to bleeding into the area around the pancreas. General surgery being consulted for possible splenectomy and we are asked to give recommendations with regards to vaccines needed in a patient who will undergo splenectomy    Past Medical History  Diagnosis Date  . Intestine disorder     intestinal blockage  . History of stomach ulcers   . Pancreatitis 11/2011    pseudocyst and pancreatic calcifications 11/2011  . GERD (gastroesophageal reflux disease)     occasional   . Arthritis     left shoulder   . Cirrhosis ([place]) 2013    hx of   . Swelling of joint of right knee     05-11-14 has informed DR., awaitng for instructions-denies warmth, has intrmittent pain or discomfort  . Ascites 05/2012    4 liter paracentesis.   . Diabetes mellitus ([place])     08/2015 sugars into 200s but  hyperglycemia dates to at least 2013.     Past Surgical History  Procedure Laterality Date  . Inguinal hernia repair Right 09/29/2012    Procedure: HERNIA REPAIR INGUINAL ADULT;  Surgeon: Imogene Burn. Tsuei, MD;  Location: [facility];  Service: General;  Laterality: Right;  . Umbilical hernia repair N/A 09/29/2012    Procedure: HERNIA REPAIR UMBILICAL ADULT;  Surgeon: Imogene Burn. Georgette Dover, MD;  Location: [facility];  Service: General;  Laterality: N/A;  . Insertion of mesh Right 09/29/2012    Procedure: INSERTION OF MESH;  Surgeon: Imogene Burn. Georgette Dover, MD;  Location: [facility];  Service: General;  Laterality: Right;  . Eus N/A 07/13/2013    Procedure: ESOPHAGEAL ENDOSCOPIC ULTRASOUND (EUS) RADIAL;  Surgeon: Arta Silence, MD;  Location: [facility];  Service: Endoscopy;  Laterality: N/A;  . Esophagogastroduodenoscopy Left 09/02/2015    Procedure: ESOPHAGOGASTRODUODENOSCOPY (EGD);  Surgeon: Manus Gunning, MD;  Location: [facility];  Service: Gastroenterology;  Laterality: Left;    Social History:  reports that he has been smoking Cigarettes.  He has a 10 pack-year smoking history. He has never used smokeless tobacco. He reports that he drinks alcohol. He reports that he does not use illicit drugs.   Family History  Problem Relation Age of Onset  . Hypertension Father     No Known Allergies   Medications: I have reviewed patients current medications as documented in Epic Anti-infectives    Start     Dose/Rate Route Frequency  Ordered Stop   09/02/15 1830  cefTRIAXone (ROCEPHIN) 1 g in dextrose 5 % 50 mL IVPB     1 g 100 mL/hr over 30 Minutes Intravenous Every 24 hours 09/02/15 1808           ROS: as in HPI otherwise remainder o+ abdominal pain, blood per rectum dizziness, lightheadedness otherwise  f 12 point Review of Systems for is negative    Blood pressure 115/90, pulse 79, temperature 98.8 F (37.1 C), temperature source Oral, resp. rate 20, height 6' (1.829 m), weight 134 lb 11.2  oz (61.1 kg), SpO2 98 %. General: Alert and awake, oriented x3, not in any acute distress.underweight HEENT: ,  EOMI, oropharynx clear and without exudate Cardiovascular: Tachycardic,  no murmur rubs or gallops Pulmonary: clear to auscultation bilaterally, no wheezing, rales or rhonchi Gastrointestinal: soft tender palpation diffusely positive bowel sounds, Musculoskeletal: no  clubbing or edema noted bilaterally Skin, soft tissue: no rashes Neuro: nonfocal, strength and sensation intact   Results for orders placed or performed during the hospital encounter of 09/02/15 (from the past 48 hour(s))  Prepare RBC     Status: None   Collection Time: 09/02/15  4:31 PM  Result Value Ref Range   Order Confirmation ORDER PROCESSED BY BLOOD BANK   MRSA PCR Screening     Status: None   Collection Time: 09/02/15  6:10 PM  Result Value Ref Range   MRSA by PCR NEGATIVE NEGATIVE    Comment:        The GeneXpert MRSA Assay (FDA approved for NASAL specimens only), is one component of a comprehensive MRSA colonization surveillance program. It is not intended to diagnose MRSA infection nor to guide or monitor treatment for MRSA infections.   Vitamin B12     Status: Abnormal   Collection Time: 09/02/15  6:22 PM  Result Value Ref Range   Vitamin B-12 163 (L) 180 - 914 pg/mL    Comment: (NOTE) This assay is not validated for testing neonatal or myeloproliferative syndrome specimens for Vitamin B12 levels.   Folate     Status: None   Collection Time: 09/02/15  6:22 PM  Result Value Ref Range   Folate 10.1 >5.9 ng/mL  Iron and TIBC     Status: Abnormal   Collection Time: 09/02/15  6:22 PM  Result Value Ref Range   Iron 16 (L) 45 - 182 ug/dL   TIBC 176 (L) 250 - 450 ug/dL   Saturation Ratios 9 (L) 17.9 - 39.5 %   UIBC 160 ug/dL  Ferritin     Status: None   Collection Time: 09/02/15  6:22 PM  Result Value Ref Range   Ferritin 179 24 - 336 ng/mL  Ethanol     Status: None   Collection Time:  09/02/15  6:22 PM  Result Value Ref Range   Alcohol, Ethyl (B) <5 <5 mg/dL    Comment:        LOWEST DETECTABLE LIMIT FOR SERUM ALCOHOL IS 5 mg/dL FOR MEDICAL PURPOSES ONLY   CBC     Status: Abnormal   Collection Time: 09/02/15  6:22 PM  Result Value Ref Range   WBC 11.8 (H) 4.0 - 10.5 K/uL    Comment: WHITE COUNT CONFIRMED ON SMEAR   RBC 2.37 (L) 4.22 - 5.81 MIL/uL   Hemoglobin 7.1 (L) 13.0 - 17.0 g/dL   HCT 22.0 (L) 39.0 - 52.0 %   MCV 92.8 78.0 - 100.0 fL   MCH 30.0 26.0 -  34.0 pg   MCHC 32.3 30.0 - 36.0 g/dL   RDW 14.2 11.5 - 15.5 %   Platelets 194 150 - 400 K/uL  CBC     Status: Abnormal   Collection Time: 09/03/15  2:06 AM  Result Value Ref Range   WBC 10.4 4.0 - 10.5 K/uL   RBC 2.69 (L) 4.22 - 5.81 MIL/uL   Hemoglobin 8.1 (L) 13.0 - 17.0 g/dL   HCT 24.5 (L) 39.0 - 52.0 %   MCV 91.1 78.0 - 100.0 fL   MCH 30.1 26.0 - 34.0 pg   MCHC 33.1 30.0 - 36.0 g/dL   RDW 13.9 11.5 - 15.5 %   Platelets 186 150 - 400 K/uL  CBC     Status: Abnormal   Collection Time: 09/03/15  5:15 AM  Result Value Ref Range   WBC 10.3 4.0 - 10.5 K/uL   RBC 2.80 (L) 4.22 - 5.81 MIL/uL   Hemoglobin 8.5 (L) 13.0 - 17.0 g/dL   HCT 25.3 (L) 39.0 - 52.0 %   MCV 90.4 78.0 - 100.0 fL   MCH 30.4 26.0 - 34.0 pg   MCHC 33.6 30.0 - 36.0 g/dL   RDW 13.7 11.5 - 15.5 %   Platelets 198 150 - 400 K/uL  CBC     Status: Abnormal   Collection Time: 09/04/15  2:22 AM  Result Value Ref Range   WBC 7.2 4.0 - 10.5 K/uL   RBC 2.68 (L) 4.22 - 5.81 MIL/uL   Hemoglobin 8.1 (L) 13.0 - 17.0 g/dL   HCT 24.4 (L) 39.0 - 52.0 %   MCV 91.0 78.0 - 100.0 fL   MCH 30.2 26.0 - 34.0 pg   MCHC 33.2 30.0 - 36.0 g/dL   RDW 13.6 11.5 - 15.5 %   Platelets 253 150 - 400 K/uL  Comprehensive metabolic panel     Status: Abnormal   Collection Time: 09/04/15  2:22 AM  Result Value Ref Range   Sodium 136 135 - 145 mmol/L   Potassium 3.6 3.5 - 5.1 mmol/L    Comment: DELTA CHECK NOTED   Chloride 102 101 - 111 mmol/L   CO2 27 22 -  32 mmol/L   Glucose, Bld 107 (H) 65 - 99 mg/dL   BUN <5 (L) 6 - 20 mg/dL   Creatinine, Ser 0.69 0.61 - 1.24 mg/dL   Calcium 8.2 (L) 8.9 - 10.3 mg/dL   Total Protein 4.5 (L) 6.5 - 8.1 g/dL   Albumin 2.1 (L) 3.5 - 5.0 g/dL   AST 12 (L) 15 - 41 U/L   ALT 9 (L) 17 - 63 U/L   Alkaline Phosphatase 57 38 - 126 U/L   Total Bilirubin 0.4 0.3 - 1.2 mg/dL   GFR calc non Af Amer >60 >60 mL/min   GFR calc Af Amer >60 >60 mL/min    Comment: (NOTE) The eGFR has been calculated using the CKD EPI equation. This calculation has not been validated in all clinical situations. eGFR's persistently <60 mL/min signify possible Chronic Kidney Disease.    Anion gap 7 5 - 15  Magnesium     Status: None   Collection Time: 09/04/15  2:22 AM  Result Value Ref Range   Magnesium 1.7 1.7 - 2.4 mg/dL   '@BRIEFLABTABLE' (sdes,specrequest,cult,reptstatus)   ) Recent Results (from the past 720 hour(s))  MRSA PCR Screening     Status: None   Collection Time: 09/02/15  6:10 PM  Result Value Ref Range Status   MRSA  by PCR NEGATIVE NEGATIVE Final    Comment:        The GeneXpert MRSA Assay (FDA approved for NASAL specimens only), is one component of a comprehensive MRSA colonization surveillance program. It is not intended to diagnose MRSA infection nor to guide or monitor treatment for MRSA infections.      Impression/Recommendation  Principal Problem:   UGI bleed Active Problems:   Cirrhosis ([place])   Alcoholic pancreatitis   Protein-calorie malnutrition, severe ([facility])   Abdominal pain in male   Alcohol abuse   Abnormal CT of the abdomen   Gastric varices   Pancreatic mass   Acute blood loss anemia   Alexander Schmitt is a 65 y.o. male with  lying vein thrombosis and gastric varices bleeding around the pancreas in whom a splenectomy is being considered.  I.  Need for Vaccines in patient awaiting possible splenectomy:  If the patient is going to have splenectomy electively and it will be several  weeks receommendation is to give the following vaccines 14 days prior to splenectomy  #1: 13-valent pneumococcal conjugate vaccine (Prevnar) now  8 weeks later  PPSV23: 23-valent pneumococcal polysaccharide vaccine (Pneumovax).  #2 Haemophilus b conjugate vaccine now  #3 quadrivalent conjugate vaccine Meningococcal vaccine Menactra or Menveo now  If instead splenectomy will be done DURING THIS ADMISSION and fairly urgently  One should wait until 14 DAYS POST SPLENECTOMY and then give:  #1 13-valent pneumococcal conjugate vaccine (Prevnar)   8 weeks later  PPSV23: 23-valent pneumococcal polysaccharide vaccine (Pneumovax).  #2 Haemophilus b conjugate vaccine   #3 quadrivalent conjugate vaccine Meningococcal vaccine Menactra or Menveo  The  PPSV3 and Meningococcal vaccine will need to be repeated every 5 years    II Screening: would check HIV, HCV antibodies along with Hep B  I will sign off for now  Please call with further questions.    09/04/2015, 4:07 PM   Thank you so much for this interesting consult  [place] [facility] [place] [phone] (pager) [phone] (office) 09/04/2015, 4:07 PM  Rhina [place] 09/04/2015, 4:07 PM

## 2015-09-04 NOTE — Consult Note (Signed)
[facility] Surgery Consult Note  Alexander Schmitt 05/03/1951  [id number].    Requesting MD:  Chief Complaint/Reason for Consult: Portal vein thrombosis/enlarged spleen  HPI:  65 y/o male with PMH of alcoholism, cirrhosis, chronic pancreatitis with pseudocysts, peptic ulcer disease with history of reported GI bleeds, presenting with epigastric to LUQ pain, hematochezia, and melana since Wednesday. He reports his stools had old and new liquid blood in them without clots, having 3 BMs per day.  He thought it was something he ate/drank, but the bleeding and pain didn't resolve.  Admits to recent anorexia, no N/V.  Pain radiates to his back, precipitating factors are alcohol and eating.  No alleviating factors.  Admits to 10-15lb unintentional weight loss.  He reportedly drank 1 gallon of wine daily, but 1.5 weeks ago quit drinking and smoking.  He said he was diagnosed with pancreatitis about 5 years ago (I see notes from 2013 in epic).  He has never had a prior colonoscopy.  He is followed by Dr. Paulita Fujita as an outpatient.    He was resuscitated with IVF, and 2 units of blood.  He is on protonix drip and octreotide.  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  GI is considering a repeat EGD at some point for biopsies.  CT (2/26) showed 5.6cm mass vs.cystic structure anterior to the pancreas, with a 3cm cyst at the tail of the pancreas, along with changes consistent with chronic pancreatitis. 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  This fluid collection connects to a dorsal collection 3.6 x 2.1 cm which has an appearence of a pseudocyst.  Also noted an enlarged spleen and left adrenal fluid collection measuring 4.2 x 1.7cm.  H/o open right  inguinal and right umbilical hernia repair by Dr. Georgette Dover 09/01/2012.  He is disabled secondary to his chronic pancreatitis.  ROS: All systems reviewed and otherwise negative except for as above  Family History  Problem Relation Age of Onset  . Hypertension Father     Past Medical History  Diagnosis Date  . Intestine disorder     intestinal blockage  . History of stomach ulcers   . Pancreatitis 11/2011    pseudocyst and pancreatic calcifications 11/2011  . GERD (gastroesophageal reflux disease)     occasional   . Arthritis     left shoulder   . Cirrhosis ([place]) 2013    hx of   . Swelling of joint of right knee     05-11-14 has informed DR., awaitng for instructions-denies warmth, has intrmittent pain or discomfort  . Ascites 05/2012    4 liter paracentesis.   . Diabetes mellitus ([place])     08/2015 sugars into 200s but hyperglycemia dates to at least 2013.     Past Surgical History  Procedure Laterality Date  . Inguinal hernia repair Right 09/29/2012    Procedure: HERNIA REPAIR INGUINAL ADULT;  Surgeon: Imogene Burn. Tsuei, MD;  Location: [facility];  Service: General;  Laterality: Right;  . Umbilical hernia repair N/A 09/29/2012    Procedure: HERNIA REPAIR UMBILICAL ADULT;  Surgeon: Imogene Burn. Georgette Dover, MD;  Location: [facility];  Service: General;  Laterality: N/A;  . Insertion of mesh Right 09/29/2012    Procedure: INSERTION OF MESH;  Surgeon: Imogene Burn. Tsuei, MD;  Location: [facility];  Service: General;  Laterality: Right;  . Eus N/A 07/13/2013    Procedure: ESOPHAGEAL ENDOSCOPIC ULTRASOUND (EUS) RADIAL;  Surgeon: Arta Silence, MD;  Location: [facility];  Service: Endoscopy;  Laterality: N/A;  . Esophagogastroduodenoscopy Left 09/02/2015    Procedure: ESOPHAGOGASTRODUODENOSCOPY (EGD);  Surgeon: Manus Gunning, MD;  Location: [place];  Service: Gastroenterology;  Laterality: Left;    Social History:  reports that he has been smoking Cigarettes.  He has a 10 pack-year smoking history.  He has never used smokeless tobacco. He reports that he drinks alcohol. He reports that he does not use illicit drugs.  Allergies: No Known Allergies  Medications Prior to Admission  Medication Sig Dispense Refill  . acetaminophen (TYLENOL) 325 MG tablet Take 650 mg by mouth every 6 (six) hours as needed (pain).    . bismuth subsalicylate (PEPTO BISMOL) 262 MG/15ML suspension Take 30 mLs by mouth every 6 (six) hours as needed for indigestion or diarrhea or loose stools.    Marland Kitchen ibuprofen (ADVIL,MOTRIN) 200 MG tablet Take 200 mg by mouth every 6 (six) hours as needed for mild pain.    . magnesium hydroxide (MILK OF MAGNESIA) 400 MG/5ML suspension Take 5 mLs by mouth daily as needed for mild constipation.      Blood pressure 95/64, pulse 72, temperature 98.8 F (37.1 C), temperature source Oral, resp. rate 20, height 6' (1.829 m), weight 61.1 kg (134 lb 11.2 oz), SpO2 98 %. Physical Exam: General: pleasant, WD/WN male who is laying in bed in NAD HEENT: head is normocephalic, atraumatic.  Sclera are noninjected.  PERRL.  No scleral icterus.  Ears and nose without any masses or lesions.  Mouth is pink and moist Heart: regular, rate, and rhythm.  No obvious murmurs, gallops, or rubs noted.  Palpable pedal pulses bilaterally Lungs: CTAB, no wheezes, rhonchi, or rales noted.  Respiratory effort nonlabored Abd: soft, distended, NT, +BS, no hernias, scars not visible, left abdomen is more firm on left MS: all 4 extremities are symmetrical with no cyanosis, clubbing, or edema. Skin: warm and dry with no masses, lesions, or rashes, olive skin tone Psych: A&Ox3 with an appropriate affect.   Results for orders placed or performed during the hospital encounter of 09/02/15 (from the past 48 hour(s))  Protime-INR     Status: Abnormal   Collection Time: 09/02/15  3:00 PM  Result Value Ref Range   Prothrombin Time 17.3 (H) 11.6 - 15.2 seconds   INR 1.40 0.00 - 1.49  Prepare RBC     Status: None    Collection Time: 09/02/15  4:31 PM  Result Value Ref Range   Order Confirmation ORDER PROCESSED BY BLOOD BANK   MRSA PCR Screening     Status: None   Collection Time: 09/02/15  6:10 PM  Result Value Ref Range   MRSA by PCR NEGATIVE NEGATIVE    Comment:        The GeneXpert MRSA Assay (FDA approved for NASAL specimens only), is one component of a comprehensive MRSA colonization surveillance program. It is not intended to diagnose MRSA infection nor to guide or monitor treatment for MRSA infections.   Vitamin B12     Status: Abnormal   Collection Time: 09/02/15  6:22 PM  Result Value Ref Range   Vitamin B-12 163 (L) 180 - 914 pg/mL    Comment: (NOTE) This assay is not validated for testing neonatal or myeloproliferative syndrome specimens for Vitamin B12 levels.   Folate  Status: None   Collection Time: 09/02/15  6:22 PM  Result Value Ref Range   Folate 10.1 >5.9 ng/mL  Iron and TIBC     Status: Abnormal   Collection Time: 09/02/15  6:22 PM  Result Value Ref Range   Iron 16 (L) 45 - 182 ug/dL   TIBC 176 (L) 250 - 450 ug/dL   Saturation Ratios 9 (L) 17.9 - 39.5 %   UIBC 160 ug/dL  Ferritin     Status: None   Collection Time: 09/02/15  6:22 PM  Result Value Ref Range   Ferritin 179 24 - 336 ng/mL  Ethanol     Status: None   Collection Time: 09/02/15  6:22 PM  Result Value Ref Range   Alcohol, Ethyl (B) <5 <5 mg/dL    Comment:        LOWEST DETECTABLE LIMIT FOR SERUM ALCOHOL IS 5 mg/dL FOR MEDICAL PURPOSES ONLY   CBC     Status: Abnormal   Collection Time: 09/02/15  6:22 PM  Result Value Ref Range   WBC 11.8 (H) 4.0 - 10.5 K/uL    Comment: WHITE COUNT CONFIRMED ON SMEAR   RBC 2.37 (L) 4.22 - 5.81 MIL/uL   Hemoglobin 7.1 (L) 13.0 - 17.0 g/dL   HCT 22.0 (L) 39.0 - 52.0 %   MCV 92.8 78.0 - 100.0 fL   MCH 30.0 26.0 - 34.0 pg   MCHC 32.3 30.0 - 36.0 g/dL   RDW 14.2 11.5 - 15.5 %   Platelets 194 150 - 400 K/uL  CBC     Status: Abnormal   Collection Time:  09/03/15  2:06 AM  Result Value Ref Range   WBC 10.4 4.0 - 10.5 K/uL   RBC 2.69 (L) 4.22 - 5.81 MIL/uL   Hemoglobin 8.1 (L) 13.0 - 17.0 g/dL   HCT 24.5 (L) 39.0 - 52.0 %   MCV 91.1 78.0 - 100.0 fL   MCH 30.1 26.0 - 34.0 pg   MCHC 33.1 30.0 - 36.0 g/dL   RDW 13.9 11.5 - 15.5 %   Platelets 186 150 - 400 K/uL  CBC     Status: Abnormal   Collection Time: 09/03/15  5:15 AM  Result Value Ref Range   WBC 10.3 4.0 - 10.5 K/uL   RBC 2.80 (L) 4.22 - 5.81 MIL/uL   Hemoglobin 8.5 (L) 13.0 - 17.0 g/dL   HCT 25.3 (L) 39.0 - 52.0 %   MCV 90.4 78.0 - 100.0 fL   MCH 30.4 26.0 - 34.0 pg   MCHC 33.6 30.0 - 36.0 g/dL   RDW 13.7 11.5 - 15.5 %   Platelets 198 150 - 400 K/uL  CBC     Status: Abnormal   Collection Time: 09/04/15  2:22 AM  Result Value Ref Range   WBC 7.2 4.0 - 10.5 K/uL   RBC 2.68 (L) 4.22 - 5.81 MIL/uL   Hemoglobin 8.1 (L) 13.0 - 17.0 g/dL   HCT 24.4 (L) 39.0 - 52.0 %   MCV 91.0 78.0 - 100.0 fL   MCH 30.2 26.0 - 34.0 pg   MCHC 33.2 30.0 - 36.0 g/dL   RDW 13.6 11.5 - 15.5 %   Platelets 253 150 - 400 K/uL  Comprehensive metabolic panel     Status: Abnormal   Collection Time: 09/04/15  2:22 AM  Result Value Ref Range   Sodium 136 135 - 145 mmol/L   Potassium 3.6 3.5 - 5.1 mmol/L    Comment: DELTA CHECK  NOTED   Chloride 102 101 - 111 mmol/L   CO2 27 22 - 32 mmol/L   Glucose, Bld 107 (H) 65 - 99 mg/dL   BUN <5 (L) 6 - 20 mg/dL   Creatinine, Ser 0.69 0.61 - 1.24 mg/dL   Calcium 8.2 (L) 8.9 - 10.3 mg/dL   Total Protein 4.5 (L) 6.5 - 8.1 g/dL   Albumin 2.1 (L) 3.5 - 5.0 g/dL   AST 12 (L) 15 - 41 U/L   ALT 9 (L) 17 - 63 U/L   Alkaline Phosphatase 57 38 - 126 U/L   Total Bilirubin 0.4 0.3 - 1.2 mg/dL   GFR calc non Af Amer >60 >60 mL/min   GFR calc Af Amer >60 >60 mL/min    Comment: (NOTE) The eGFR has been calculated using the CKD EPI equation. This calculation has not been validated in all clinical situations. eGFR's persistently <60 mL/min signify possible Chronic  Kidney Disease.    Anion gap 7 5 - 15  Magnesium     Status: None   Collection Time: 09/04/15  2:22 AM  Result Value Ref Range   Magnesium 1.7 1.7 - 2.4 mg/dL   Mr 3d Recon At Scanner  09/04/2015  ADDENDUM REPORT: 09/04/2015 13:39 ADDENDUM: In discussion with Dr. 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 The splenic vein does not fill with the IV contrast. The superior mesenteric vein is opacified with IV contrast. The proximal portal vein is slightly constricted and the more distal main and LEFT and RIGHT portal vein are patent. Additionally there are mild venous collaterals in the gastrohepatic ligament and again in the splenic hilum. 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 Findings conveyed toJohn Henrene Pastor, [place] 09/04/2015  at13:36. Electronically Signed   By: Suzy Bouchard M.D.   On: 09/04/2015 13:39  09/04/2015  CLINICAL DATA:  Pancreatic cysts. Alcoholic pancreatitis. Pancreatic peripancreatic mass lesion on CT. EXAM: MRI ABDOMEN WITHOUT AND WITH CONTRAST (INCLUDING MRCP) TECHNIQUE: Multiplanar multisequence MR imaging of the abdomen was performed both before and after the administration of intravenous contrast. Heavily T2-weighted images of the biliary and pancreatic ducts were obtained, and three-dimensional MRCP images were rendered by post processing. CONTRAST:  16m MULTIHANCE GADOBENATE DIMEGLUMINE 529 MG/ML IV SOLN COMPARISON:  CT 09/02/2015, 04/25/2013 FINDINGS: Lower chest:  Lung bases are clear. Hepatobiliary: Multiple nonenhancing cysts within the LEFT and RIGHT hepatic lobe. No focal enhancing hepatic lesion. No biliary duct dilatation. Gallbladder is normal. Pancreas: Pancreatic parenchyma is difficult define on the background of retroperitoneal and peritoneal inflammation. The pancreatic duct is chronically dilated. This ductal dilatation seen on image 35, series 1403). There is a  communicating bilobed fluid collection ventral and dorsal to the pancreatic tail. The ventral portion is rounded measuring 5.0 by 4.0 cm corresponds to the questionable mass on CT. This lesion has some peripheral high signal intensity on T1 weighted imaging (image 30, series 1400) indicating blood product or proteinaceous products. No significant internal enhancement. This lesion communicates to a dorsal fluid collection measuring 3.6 by 2.1 cm which is more typical appearance of a pseudocyst. These bilobed cystic collections communicate through an isthmus on image 28, series 1402. There is a less well organized fluid collection in the LEFT adrenal region measuring 4.2 by 1.7 cm on image 37, series 1403). Spleen: Spleen is mildly enlarged. There venous collaterals in the splenic hilum. Adrenals/urinary tract: Adrenal glands and kidneys are normal. Stomach/Bowel: Stomach and limited  of the small bowel is unremarkable Vascular/Lymphatic: Abdominal aortic normal caliber. No retroperitoneal periportal lymphadenopathy. Musculoskeletal: No aggressive osseous lesion IMPRESSION: 1. Lesion in question on comparison CT corresponds to complex nonenhancing mass most consistent with an hemorrhagic or infected pseudocyst. This complex lesion communicates with a more typical pseudocyst ventral to the pancreatic tail. Recommend follow-up CT with contrast in to re-evaluate these complex cystic collections. 2. A second less well organized fluid collection in the LEFT super renal location. 3. Evidence of acute on chronic pancreatitis. 4. No evidence of biliary duct dilatation or enhancing liver lesion. Electronically Signed: By: Suzy Bouchard M.D. On: 09/04/2015 11:03   Mr Jeananne Rama W/wo Cm/mrcp  09/04/2015  ADDENDUM REPORT: 09/04/2015 13:39 ADDENDUM: In discussion with Dr. 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 The splenic vein does not fill with the IV contrast. The superior mesenteric vein is opacified with IV  contrast. The proximal portal vein is slightly constricted and the more distal main and LEFT and RIGHT portal vein are patent. Additionally there are mild venous collaterals in the gastrohepatic ligament and again in the splenic hilum. 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 Findings conveyed toJohn Henrene Pastor, [place] 09/04/2015  at13:36. Electronically Signed   By: Suzy Bouchard M.D.   On: 09/04/2015 13:39  09/04/2015  CLINICAL DATA:  Pancreatic cysts. Alcoholic pancreatitis. Pancreatic peripancreatic mass lesion on CT. EXAM: MRI ABDOMEN WITHOUT AND WITH CONTRAST (INCLUDING MRCP) TECHNIQUE: Multiplanar multisequence MR imaging of the abdomen was performed both before and after the administration of intravenous contrast. Heavily T2-weighted images of the biliary and pancreatic ducts were obtained, and three-dimensional MRCP images were rendered by post processing. CONTRAST:  30m MULTIHANCE GADOBENATE DIMEGLUMINE 529 MG/ML IV SOLN COMPARISON:  CT 09/02/2015, 04/25/2013 FINDINGS: Lower chest:  Lung bases are clear. Hepatobiliary: Multiple nonenhancing cysts within the LEFT and RIGHT hepatic lobe. No focal enhancing hepatic lesion. No biliary duct dilatation. Gallbladder is normal. Pancreas: Pancreatic parenchyma is difficult define on the background of retroperitoneal and peritoneal inflammation. The pancreatic duct is chronically dilated. This ductal dilatation seen on image 35, series 1403). There is a communicating bilobed fluid collection ventral and dorsal to the pancreatic tail. The ventral portion is rounded measuring 5.0 by 4.0 cm corresponds to the questionable mass on CT. This lesion has some peripheral high signal intensity on T1 weighted imaging (image 30, series 1400) indicating blood product or proteinaceous products. No significant internal enhancement. This lesion communicates to a dorsal fluid collection measuring 3.6  by 2.1 cm which is more typical appearance of a pseudocyst. These bilobed cystic collections communicate through an isthmus on image 28, series 1402. There is a less well organized fluid collection in the LEFT adrenal region measuring 4.2 by 1.7 cm on image 37, series 1403). Spleen: Spleen is mildly enlarged. There venous collaterals in the splenic hilum. Adrenals/urinary tract: Adrenal glands and kidneys are normal. Stomach/Bowel: Stomach and limited of the small bowel is unremarkable Vascular/Lymphatic: Abdominal aortic normal caliber. No retroperitoneal periportal lymphadenopathy. Musculoskeletal: No aggressive osseous lesion IMPRESSION: 1. Lesion in question on comparison CT corresponds to complex nonenhancing mass most consistent with an hemorrhagic or infected pseudocyst. This complex lesion communicates with a more typical pseudocyst ventral to the pancreatic tail. Recommend follow-up CT with contrast in to re-evaluate these complex cystic collections. 2. A second less well organized fluid collection in the LEFT super renal location. 3. Evidence of acute on chronic pancreatitis. 4. No evidence of  biliary duct dilatation or enhancing liver lesion. Electronically Signed: By: Suzy Bouchard M.D. On: 09/04/2015 11:03      Assessment/Plan Splenomegaly and portal vein thrombosis -We were asked by GI to see for consideration of splenectomy.  Will discuss with Dr. Rosendo Gros and he will give recs -Ardine Eng score is B based off his current labs -Will need to discuss peri-operative clearance prior to decision to proceed with surgery or not -ABL anemia likely from gastric varices or possibly ulceration of duodenum.  May also be having blood per MRI into his pseudocyst. -Ongoing alcoholism is complicating factor although he says he's quit this time (no drinking for 1.5 week) -If acute bleeding may need angio/GI, tRBC are usually less helpful Casandra Doffing, [facility]  Surgery 09/04/2015, 2:53 PM Pager: [phone] (7am - 4:30pm M-F; 7am - 11:30am Sa/Su)

## 2015-09-04 NOTE — Progress Notes (Signed)
Daily Rounding Note  09/04/2015, 8:10 AM  LOS: 2 days   SUBJECTIVE:       Stool yesterday dark with some red blood.  No nausea, eating solids without problems.  No pain.  No dyspnea  OBJECTIVE:         Vital signs in last 24 hours:    Temp:  [97.3 F (36.3 C)-98.6 F (37 C)] 98 F (36.7 C) (02/28 0700) Pulse Rate:  [64-87] 73 (02/28 0700) Resp:  [12-19] 15 (02/28 0700) BP: (96-136)/(65-80) 123/79 mmHg (02/28 0700) SpO2:  [97 %-100 %] 98 % (02/28 0700) Last BM Date: 09/03/15 Filed Weights   09/02/15 1807  Weight: 61.1 kg (134 lb 11.2 oz)   General: comfortable, cachectic but otherwise not ill looking   Heart: RRR Chest: clear bil.  No labored breathing or cough. Abdomen: thin, soft, active BS.  NT, ND  Extremities: no CCE Neuro/Psych:  Oriented x 3.  Appropriate.  No gross deficits.  No tremor or asterixis.   Intake/Output from previous day: 02/27 0701 - 02/28 0700 In: 920 [P.O.:720; I.V.:200] Out: 525 [Urine:525]  Intake/Output this shift:    Lab Results:  Recent Labs  09/03/15 0206 09/03/15 0515 09/04/15 0222  WBC 10.4 10.3 7.2  HGB 8.1* 8.5* 8.1*  HCT 24.5* 25.3* 24.4*  PLT 186 198 253   BMET  Recent Labs  09/02/15 0920 09/02/15 0937 09/04/15 0222  NA 133* 131* 136  K 4.8 4.4 3.6  CL 95* 93* 102  CO2 22  --  27  GLUCOSE 239* 244* 107*  BUN 13 15 <5*  CREATININE 1.05 0.70 0.69  CALCIUM 8.7*  --  8.2*   LFT  Recent Labs  09/02/15 0920 09/04/15 0222  PROT 5.3* 4.5*  ALBUMIN 2.5* 2.1*  AST 57* 12*  ALT 17 9*  ALKPHOS 72 57  BILITOT 0.9 0.4   PT/INR  Recent Labs  09/02/15 1500  LABPROT 17.3*  INR 1.40   Hepatitis Panel No results for input(s): HEPBSAG, HCVAB, HEPAIGM, HEPBIGM in the last 72 hours.  Studies/Results: Ct Abdomen Pelvis W Contrast  09/02/2015  CLINICAL DATA:  Abdominal pain, discomfort for 2-3 days EXAM: CT ABDOMEN AND PELVIS WITH CONTRAST TECHNIQUE:  Multidetector CT imaging of the abdomen and pelvis was performed using the standard protocol following bolus administration of intravenous contrast. CONTRAST:  158mL OMNIPAQUE IOHEXOL 300 MG/ML  SOLN COMPARISON:  Ultrasound 01/02/2015.  CT 05/03/2013. FINDINGS: Lung bases are clear. No effusions. Heart is normal size. Coronary artery calcifications. Small hypodensities scattered throughout the liver, similar prior CT compatible with cysts. Spleen, adrenals are unremarkable. Small benign cyst in the lower pole of the right kidney. No hydronephrosis. Diffuse calcifications throughout the pancreas as seen on prior CT compatible with chronic calcific pancreatitis. In addition, there are inflammatory changes around the pancreas and in the left anterior para renal space compatible with acute pancreatitis. Fluid collection posterior to the pancreatic tail in the left upper quadrant measures up to 3.3 cm, likely developing pseudo cyst. Smaller adjacent 11 mm cystic area. Anterior to the pancreas is a solid-appearing mass measuring 5.6 x 4.6 cm. This was not present on prior study nor seen on prior ultrasound. Bowel grossly unremarkable. Small amount of free fluid in the pelvis. No free air. No adenopathy. Urinary bladder is grossly unremarkable. Aorta is calcified, non aneurysmal. IMPRESSION: Changes of calcific pancreatitis. Inflammatory changes around the pancreas compatible with acute pancreatitis. 3.3 cm an 11 mm  cystic areas adjacent to the pancreatic tail compatible with pseudocysts. 5.6 cm solid-appearing mass anterior to the pancreas. This conceivably could represent a hemorrhagic pseudo cyst, but a solid pancreatic mass or mass arising from adjacent structures cannot be completely excluded such as gist tumor. This should be further evaluated with MRI with and without contrast after acute symptoms abate and patient can tolerate the study. Hepatic cysts. Electronically Signed   By: Rolm Baptise M.D.   On: 09/02/2015  11:28   Dg Chest Port 1 View  09/02/2015  CLINICAL DATA:  Weakness.  Abdominal pain, rectal bleeding, nausea. EXAM: PORTABLE CHEST 1 VIEW COMPARISON:  07/25/2012 FINDINGS: The heart size and mediastinal contours are within normal limits. Both lungs are clear. The visualized skeletal structures are unremarkable. IMPRESSION: No active disease. Electronically Signed   By: Rolm Baptise M.D.   On: 09/02/2015 10:00   Scheduled Meds: . acetaminophen  650 mg Oral Once  . cefTRIAXone (ROCEPHIN)  IV  1 g Intravenous Q24H  . diphenhydrAMINE  25 mg Oral Once  . folic acid  1 mg Oral Daily  . multivitamin with minerals  1 tablet Oral Daily  . pantoprazole  40 mg Oral Q0600  . thiamine  100 mg Oral Daily   Continuous Infusions:  PRN Meds:.LORazepam **OR** LORazepam, magnesium hydroxide, morphine injection   ASSESMENT:   * 06/2015 acute, recurrent ETOH pancreatitis and early pseudocyst. Calcification c/w chronic pancreatitis. Solid appearing mass on 2/26 CT, ? Hemorrhagic pseudocyst/cn not rule out GIST.   * Bleeding PR. Dark stool with red blood. 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 normal ampulla. No active bleeding. Octreotide stopped 2/27 after 2 days.  On daily oral PPI.   * Cirrhosis. Hx ascites: 4 liter paracentesis 05/2012, no SBP. Platelets normal. PT increased to 17.3.   * Alcoholism. Not in remission.   * Anemia, normocytic. S/p PRBC x 2.   Hgb stable. ? If only due to GI blood loss? Hgb 06/2015: 13.0. Iron and iron sat low, ferritin 179. B 12 low.   * DM. Glucose to 244.   * SBO 11/2005.     PLAN   *  Waiting on MRI/MRCP, just completed, no report yet.      Alexander Schmitt  09/04/2015, 8:10 AM Pager: [phone]  Addendum 1320 MRI/MRCP:  Lower chest: Lung bases are clear. Hepatobiliary: Multiple nonenhancing cysts within the LEFT and  RIGHT hepatic lobe. No focal enhancing hepatic lesion. No biliary duct dilatation. Gallbladder is normal. Pancreas: Pancreatic parenchyma is difficult define on the background of retroperitoneal and peritoneal inflammation. The pancreatic duct is chronically dilated. This ductal dilatation seen on image 35, series 1403). There is a communicating bilobed fluid collection ventral and dorsal to the pancreatic tail. The ventral portion is rounded measuring 5.0 by 4.0 cm corresponds to the questionable mass on CT. This lesion has some peripheral high signal intensity on T1 weighted imaging (image 30, series 1400) indicating blood product or proteinaceous products. No significant internal enhancement. This lesion communicates to a dorsal fluid collection measuring 3.6 by 2.1 cm which is more typical appearance of a pseudocyst. These bilobed cystic collections communicate through an isthmus on image 28, series 1402. There is a less well organized fluid collection in the LEFT adrenal region measuring 4.2 by 1.7 cm on image 37, series 1403). Spleen: Spleen is mildly enlarged. There venous collaterals in the splenic hilum. Adrenals/urinary tract: Adrenal  glands and kidneys are normal. Stomach/Bowel: Stomach and limited of the small bowel is unremarkable Vascular/Lymphatic: Abdominal aortic normal caliber. No retroperitoneal periportal lymphadenopathy. Musculoskeletal: No aggressive osseous lesion IMPRESSION: 1. Lesion in question on comparison CT corresponds to complex nonenhancing mass most consistent with an hemorrhagic or infected pseudocyst. This complex lesion communicates with a more typical pseudocyst ventral to the pancreatic tail. Recommend follow-up CT with contrast in to re-evaluate these complex cystic collections. 2. A second less well organized fluid collection in the LEFT super renal location. 3. Evidence of acute on chronic pancreatitis. 4. No evidence of biliary  duct dilatation or enhancing liver lesion.   GI ATTENDING  Interval history data reviewed. Patient seen and examined. Agree with interval progress note as outlined. I have reviewed both the CT scan and the MRI with Dr. Suzy Bouchard. 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 Certainly, this could have been the source for his GI bleed. Next, the masslike lesions on CT appear to be benign on MRI (blood and proteinaceous debris as opposed to tumor). If the patient's bleeding was secondary to gastric varices, then splenectomy, after appropriate vaccination series, his treatment of choice. However, timing would be the issue. I would like general surgery to see him regarding an opinion on this matter. In the meantime, advance diet, increase activity, monitor clinical status. For rebleeding, tagged red blood cell scan and possible angiography would continue to be the interval step. We will ask ID to recommend appropriate vaccination series, should splenectomy required.  Docia Chuck. Geri Seminole., M.D. [facility] Division of Gastroenterology

## 2015-09-04 NOTE — Progress Notes (Signed)
PATIENT DETAILS Name: Alexander Schmitt Age: 65 y.o. Sex: male Date of Birth: 29-Sep-1950 Admit Date: 09/02/2015 Admitting Physician Reyne Dumas, MD [id number], Sharyn Lull, NP  Subjective: . 2 small bowel movements with some dark/red blood yesterday. None today.  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 Bleeding seems to have slowed down significantly. GI consulted, underwent EGD on 2/26 which did not show any stigmata of bleeding. GI is following, await further recommendations.  Active Problems: Acute blood loss anemia: Secondary to above, hemoglobin stable following 3 units of PRBC on admission. Continue to follow CBC and transfuse as needed.  5.6 cm solid mass anterior to the pancreas: Likely pseudocysts-await results of MRI.   Chronic pancreatitis with Pancreatic pseudocysts: Supportive care for now-tolerating diet. Await results of MRI abdomen.  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 On IV Rocephin for SBP prophylaxis. Follow.  Alcohol abuse: last drink one week prior to this admission. No evidence of withdrawal at this time.  Severe malnutrition: Nutrition evaluation  Disposition: Remain inpatient-stable to be transferred to MedSurg unit today.  Antimicrobial agents  See below  Anti-infectives    Start     Dose/Rate Route Frequency Ordered Stop   09/02/15 1830  cefTRIAXone (ROCEPHIN) 1 g in dextrose 5 % 50 mL IVPB     1 g 100 mL/hr over 30 Minutes Intravenous Every 24 hours 09/02/15 1808        DVT Prophylaxis:  SCD's  Code Status: Full code   Family Communication None at bedside  Procedures: 2/26>> EGD  CONSULTS:  GI  Time spent 25 minutes-Greater than 50% of this time was spent in counseling, explanation of diagnosis, planning of further management, and coordination of care.  MEDICATIONS: Scheduled Meds: . acetaminophen  650 mg Oral Once  .  cefTRIAXone (ROCEPHIN)  IV  1 g Intravenous Q24H  . diphenhydrAMINE  25 mg Oral Once  . folic acid  1 mg Oral Daily  . multivitamin with minerals  1 tablet Oral Daily  . pantoprazole  40 mg Oral Q0600  . thiamine  100 mg Oral Daily   Continuous Infusions:   PRN Meds:.LORazepam **OR** LORazepam, magnesium hydroxide, morphine injection    PHYSICAL EXAM: Vital signs in last 24 hours: Filed Vitals:   09/04/15 0200 09/04/15 0344 09/04/15 0400 09/04/15 0700  BP: 110/70  118/65 123/79  Pulse: 67  70 73  Temp:  98.4 F (36.9 C)  98 F (36.7 C)  TempSrc:  Oral  Oral  Resp: 15  14 15   Height:      Weight:      SpO2: 99%  99% 98%    Weight change:  Filed Weights   09/02/15 1807  Weight: 61.1 kg (134 lb 11.2 oz)   Body mass index is 18.26 kg/(m^2).   Gen Exam: Awake and alert with clear speech.   Neck: Supple, No JVD.   Chest: B/L Clear.   CVS: S1 S2 Regular, no murmurs.  Abdomen: soft, BS +, non tender, non distended.  Extremities: no edema, lower extremities warm to touch. Neurologic: Non Focal.   Skin: No Rash.   Wounds: N/A.   Intake/Output from previous day:  Intake/Output Summary (Last 24 hours) at 09/04/15 1134 Last data filed at 09/04/15 1000  Gross per 24 hour  Intake   1080 ml  Output    525 ml  Net  555 ml     LAB RESULTS: CBC  Recent Labs Lab 09/02/15 0920 09/02/15 0937 09/02/15 1822 09/03/15 0206 09/03/15 0515 09/04/15 0222  WBC 17.9*  --  11.8* 10.4 10.3 7.2  HGB 8.3* 8.8* 7.1* 8.1* 8.5* 8.1*  HCT 25.0* 26.0* 22.0* 24.5* 25.3* 24.4*  PLT 358  --  194 186 198 253  MCV 92.6  --  92.8 91.1 90.4 91.0  MCH 30.7  --  30.0 30.1 30.4 30.2  MCHC 33.2  --  32.3 33.1 33.6 33.2  RDW 13.9  --  14.2 13.9 13.7 13.6  LYMPHSABS 1.3  --   --   --   --   --   MONOABS 2.0*  --   --   --   --   --   EOSABS 0.0  --   --   --   --   --   BASOSABS 0.0  --   --   --   --   --     Chemistries   Recent Labs Lab 09/02/15 0920 09/02/15 0937 09/04/15 0222   NA 133* 131* 136  K 4.8 4.4 3.6  CL 95* 93* 102  CO2 22  --  27  GLUCOSE 239* 244* 107*  BUN 13 15 <5*  CREATININE 1.05 0.70 0.69  CALCIUM 8.7*  --  8.2*  MG  --   --  1.7    CBG:  Recent Labs Lab 09/02/15 0900  GLUCAP 166*    GFR Estimated Creatinine Clearance: 81.7 mL/min (by C-G formula based on Cr of 0.69).  Coagulation profile  Recent Labs Lab 09/02/15 1500  INR 1.40    Cardiac Enzymes No results for input(s): CKMB, TROPONINI, MYOGLOBIN in the last 168 hours.  Invalid input(s): CK  Invalid input(s): POCBNP No results for input(s): DDIMER in the last 72 hours. No results for input(s): HGBA1C in the last 72 hours. No results for input(s): CHOL, HDL, LDLCALC, TRIG, CHOLHDL, LDLDIRECT in the last 72 hours. No results for input(s): TSH, T4TOTAL, T3FREE, THYROIDAB in the last 72 hours.  Invalid input(s): FREET3  Recent Labs  09/02/15 0920 09/02/15 1822  VITAMINB12  --  163*  FOLATE  --  10.1  FERRITIN  --  179  TIBC  --  176*  IRON  --  16*  RETICCTPCT 2.7  --     Recent Labs  09/02/15 0920  LIPASE 99*    Urine Studies No results for input(s): UHGB, CRYS in the last 72 hours.  Invalid input(s): UACOL, UAPR, USPG, UPH, UTP, UGL, UKET, UBIL, UNIT, UROB, ULEU, UEPI, UWBC, URBC, UBAC, CAST, UCOM, BILUA  MICROBIOLOGY: Recent Results (from the past 240 hour(s))  MRSA PCR Screening     Status: None   Collection Time: 09/02/15  6:10 PM  Result Value Ref Range Status   MRSA by PCR NEGATIVE NEGATIVE Final    Comment:        The GeneXpert MRSA Assay (FDA approved for NASAL specimens only), is one component of a comprehensive MRSA colonization surveillance program. It is not intended to diagnose MRSA infection nor to guide or monitor treatment for MRSA infections.     RADIOLOGY STUDIES/RESULTS: Ct Abdomen Pelvis W Contrast  09/02/2015  CLINICAL DATA:  Abdominal pain, discomfort for 2-3 days EXAM: CT ABDOMEN AND PELVIS WITH CONTRAST  TECHNIQUE: Multidetector CT imaging of the abdomen and pelvis was performed using the standard protocol following bolus administration of intravenous contrast. CONTRAST:  14mL OMNIPAQUE IOHEXOL 300  MG/ML  SOLN COMPARISON:  Ultrasound 01/02/2015.  CT 05/03/2013. FINDINGS: Lung bases are clear. No effusions. Heart is normal size. Coronary artery calcifications. Small hypodensities scattered throughout the liver, similar prior CT compatible with cysts. Spleen, adrenals are unremarkable. Small benign cyst in the lower pole of the right kidney. No hydronephrosis. Diffuse calcifications throughout the pancreas as seen on prior CT compatible with chronic calcific pancreatitis. In addition, there are inflammatory changes around the pancreas and in the left anterior para renal space compatible with acute pancreatitis. Fluid collection posterior to the pancreatic tail in the left upper quadrant measures up to 3.3 cm, likely developing pseudo cyst. Smaller adjacent 11 mm cystic area. Anterior to the pancreas is a solid-appearing mass measuring 5.6 x 4.6 cm. This was not present on prior study nor seen on prior ultrasound. Bowel grossly unremarkable. Small amount of free fluid in the pelvis. No free air. No adenopathy. Urinary bladder is grossly unremarkable. Aorta is calcified, non aneurysmal. IMPRESSION: Changes of calcific pancreatitis. Inflammatory changes around the pancreas compatible with acute pancreatitis. 3.3 cm an 11 mm cystic areas adjacent to the pancreatic tail compatible with pseudocysts. 5.6 cm solid-appearing mass anterior to the pancreas. This conceivably could represent a hemorrhagic pseudo cyst, but a solid pancreatic mass or mass arising from adjacent structures cannot be completely excluded such as gist tumor. This should be further evaluated with MRI with and without contrast after acute symptoms abate and patient can tolerate the study. Hepatic cysts. Electronically Signed   By: Rolm Baptise M.D.   On:  09/02/2015 11:28   Mr 3d Recon At Scanner  09/04/2015  CLINICAL DATA:  Pancreatic cysts. Alcoholic pancreatitis. Pancreatic peripancreatic mass lesion on CT. EXAM: MRI ABDOMEN WITHOUT AND WITH CONTRAST (INCLUDING MRCP) TECHNIQUE: Multiplanar multisequence MR imaging of the abdomen was performed both before and after the administration of intravenous contrast. Heavily T2-weighted images of the biliary and pancreatic ducts were obtained, and three-dimensional MRCP images were rendered by post processing. CONTRAST:  52mL MULTIHANCE GADOBENATE DIMEGLUMINE 529 MG/ML IV SOLN COMPARISON:  CT 09/02/2015, 04/25/2013 FINDINGS: Lower chest:  Lung bases are clear. Hepatobiliary: Multiple nonenhancing cysts within the LEFT and RIGHT hepatic lobe. No focal enhancing hepatic lesion. No biliary duct dilatation. Gallbladder is normal. Pancreas: Pancreatic parenchyma is difficult define on the background of retroperitoneal and peritoneal inflammation. The pancreatic duct is chronically dilated. This ductal dilatation seen on image 35, series 1403). There is a communicating bilobed fluid collection ventral and dorsal to the pancreatic tail. The ventral portion is rounded measuring 5.0 by 4.0 cm corresponds to the questionable mass on CT. This lesion has some peripheral high signal intensity on T1 weighted imaging (image 30, series 1400) indicating blood product or proteinaceous products. No significant internal enhancement. This lesion communicates to a dorsal fluid collection measuring 3.6 by 2.1 cm which is more typical appearance of a pseudocyst. These bilobed cystic collections communicate through an isthmus on image 28, series 1402. There is a less well organized fluid collection in the LEFT adrenal region measuring 4.2 by 1.7 cm on image 37, series 1403). Spleen: Spleen is mildly enlarged. There venous collaterals in the splenic hilum. Adrenals/urinary tract: Adrenal glands and kidneys are normal. Stomach/Bowel: Stomach and  limited of the small bowel is unremarkable Vascular/Lymphatic: Abdominal aortic normal caliber. No retroperitoneal periportal lymphadenopathy. Musculoskeletal: No aggressive osseous lesion IMPRESSION: 1. Lesion in question on comparison CT corresponds to complex nonenhancing mass most consistent with an hemorrhagic or infected pseudocyst. This complex lesion communicates with  a more typical pseudocyst ventral to the pancreatic tail. Recommend follow-up CT with contrast in to re-evaluate these complex cystic collections. 2. A second less well organized fluid collection in the LEFT super renal location. 3. Evidence of acute on chronic pancreatitis. 4. No evidence of biliary duct dilatation or enhancing liver lesion. Electronically Signed   By: Suzy Bouchard M.D.   On: 09/04/2015 11:03   Dg Chest Port 1 View  09/02/2015  CLINICAL DATA:  Weakness.  Abdominal pain, rectal bleeding, nausea. EXAM: PORTABLE CHEST 1 VIEW COMPARISON:  07/25/2012 FINDINGS: The heart size and mediastinal contours are within normal limits. Both lungs are clear. The visualized skeletal structures are unremarkable. IMPRESSION: No active disease. Electronically Signed   By: Rolm Baptise M.D.   On: 09/02/2015 10:00   Mr Abd W/wo Cm/mrcp  09/04/2015  CLINICAL DATA:  Pancreatic cysts. Alcoholic pancreatitis. Pancreatic peripancreatic mass lesion on CT. EXAM: MRI ABDOMEN WITHOUT AND WITH CONTRAST (INCLUDING MRCP) TECHNIQUE: Multiplanar multisequence MR imaging of the abdomen was performed both before and after the administration of intravenous contrast. Heavily T2-weighted images of the biliary and pancreatic ducts were obtained, and three-dimensional MRCP images were rendered by post processing. CONTRAST:  55mL MULTIHANCE GADOBENATE DIMEGLUMINE 529 MG/ML IV SOLN COMPARISON:  CT 09/02/2015, 04/25/2013 FINDINGS: Lower chest:  Lung bases are clear. Hepatobiliary: Multiple nonenhancing cysts within the LEFT and RIGHT hepatic lobe. No focal  enhancing hepatic lesion. No biliary duct dilatation. Gallbladder is normal. Pancreas: Pancreatic parenchyma is difficult define on the background of retroperitoneal and peritoneal inflammation. The pancreatic duct is chronically dilated. This ductal dilatation seen on image 35, series 1403). There is a communicating bilobed fluid collection ventral and dorsal to the pancreatic tail. The ventral portion is rounded measuring 5.0 by 4.0 cm corresponds to the questionable mass on CT. This lesion has some peripheral high signal intensity on T1 weighted imaging (image 30, series 1400) indicating blood product or proteinaceous products. No significant internal enhancement. This lesion communicates to a dorsal fluid collection measuring 3.6 by 2.1 cm which is more typical appearance of a pseudocyst. These bilobed cystic collections communicate through an isthmus on image 28, series 1402. There is a less well organized fluid collection in the LEFT adrenal region measuring 4.2 by 1.7 cm on image 37, series 1403). Spleen: Spleen is mildly enlarged. There venous collaterals in the splenic hilum. Adrenals/urinary tract: Adrenal glands and kidneys are normal. Stomach/Bowel: Stomach and limited of the small bowel is unremarkable Vascular/Lymphatic: Abdominal aortic normal caliber. No retroperitoneal periportal lymphadenopathy. Musculoskeletal: No aggressive osseous lesion IMPRESSION: 1. Lesion in question on comparison CT corresponds to complex nonenhancing mass most consistent with an hemorrhagic or infected pseudocyst. This complex lesion communicates with a more typical pseudocyst ventral to the pancreatic tail. Recommend follow-up CT with contrast in to re-evaluate these complex cystic collections. 2. A second less well organized fluid collection in the LEFT super renal location. 3. Evidence of acute on chronic pancreatitis. 4. No evidence of biliary duct dilatation or enhancing liver lesion. Electronically Signed   By:  Suzy Bouchard M.D.   On: 09/04/2015 11:03    Oren Binet, MD  [facility] Pager:[phone]  If 7PM-7AM, please contact night-coverage [email or web address] Password [email or web address] 09/04/2015, 11:34 AM   LOS: 2 days

## 2015-09-05 DIAGNOSIS — K921 Melena: Secondary | ICD-10-CM

## 2015-09-05 DIAGNOSIS — D735 Infarction of spleen: Secondary | ICD-10-CM

## 2015-09-05 LAB — CBC
HEMATOCRIT: 25.7 % — AB (ref 39.0–52.0)
HEMATOCRIT: 24.5 % — AB (ref 39.0–52.0)
HEMOGLOBIN: 7.9 g/dL — AB (ref 13.0–17.0)
Hemoglobin: 8.4 g/dL — ABNORMAL LOW (ref 13.0–17.0)
MCH: 29.7 pg (ref 26.0–34.0)
MCH: 29.5 pg (ref 26.0–34.0)
MCHC: 32.7 g/dL (ref 30.0–36.0)
MCHC: 32.2 g/dL (ref 30.0–36.0)
MCV: 90.8 fL (ref 78.0–100.0)
MCV: 91.4 fL (ref 78.0–100.0)
Platelets: 326 10*3/uL (ref 150–400)
Platelets: 319 10*3/uL (ref 150–400)
RBC: 2.83 MIL/uL — ABNORMAL LOW (ref 4.22–5.81)
RBC: 2.68 MIL/uL — AB (ref 4.22–5.81)
RDW: 13.5 % (ref 11.5–15.5)
RDW: 13.6 % (ref 11.5–15.5)
WBC: 6.3 10*3/uL (ref 4.0–10.5)
WBC: 5.6 10*3/uL (ref 4.0–10.5)

## 2015-09-05 LAB — BASIC METABOLIC PANEL
Anion gap: 10 (ref 5–15)
CHLORIDE: 102 mmol/L (ref 101–111)
CO2: 26 mmol/L (ref 22–32)
Calcium: 8.4 mg/dL — ABNORMAL LOW (ref 8.9–10.3)
Creatinine, Ser: 0.76 mg/dL (ref 0.61–1.24)
GFR calc Af Amer: >60 mL/min (ref >60)
GFR calc non Af Amer: >60 mL/min (ref >60)
GLUCOSE: 104 mg/dL — AB (ref 65–99)
POTASSIUM: 3.4 mmol/L — AB (ref 3.5–5.1)
Sodium: 138 mmol/L (ref 135–145)

## 2015-09-05 LAB — MAGNESIUM: MAGNESIUM: 1.8 mg/dL (ref 1.7–2.4)

## 2015-09-05 LAB — HIV ANTIBODY (ROUTINE TESTING W REFLEX): HIV Screen 4th Generation wRfx: NONREACTIVE

## 2015-09-05 MED ORDER — PEG-KCL-NACL-NASULF-NA ASC-C 100 G PO SOLR
0.5000 | Freq: Once | ORAL | Status: DC
  Filled 2015-09-05: qty 1

## 2015-09-05 MED ORDER — BISACODYL 5 MG PO TBEC
10.0000 mg | DELAYED_RELEASE_TABLET | Freq: Two times a day (BID) | ORAL | Status: AC
  Administered 2015-09-05 – 2015-09-06 (×2): 10 mg via ORAL
  Filled 2015-09-05 (×2): qty 2

## 2015-09-05 MED ORDER — PEG-KCL-NACL-NASULF-NA ASC-C 100 G PO SOLR: 1.0000 | Freq: Once | ORAL | Status: DC

## 2015-09-05 NOTE — Progress Notes (Signed)
3 Days Post-Op  Subjective: No complaints, no BM and no further blood noted.  Tolerating diet.  Objective: Vital signs in last 24 hours: Temp:  [97.7 F (36.5 C)-98.8 F (37.1 C)] 98.4 F (36.9 C) (03/01 0418) Pulse Rate:  [72-80] 77 (03/01 0418) Resp:  [18-20] 19 (03/01 0418) BP: (90-115)/(60-90) 90/60 mmHg (03/01 0418) SpO2:  [98 %-100 %] 100 % (03/01 0418) Last BM Date: 09/03/15 990 PO  Cardiac diet Afebrile, VSS K+ 3.4 H/H is stable MRI:  Lesion in question on comparison CT corresponds to complex nonenhancing mass most consistent with an hemorrhagic or infected pseudocyst. This complex lesion communicates with a more typical pseudocyst ventral to the pancreatic tail. A second less well organized fluid collection in the LEFT super renal location.  Evidence of acute on chronic pancreatitis.  No evidence of biliary duct dilatation or enhancing liver lesion.   Intake/Output from previous day: 02/28 0701 - 03/01 0700 In: 990 [P.O.:990] Out: 200 [Urine:200] Intake/Output this shift:    General appearance: alert, cooperative and no distress GI: soft, non-tender; bowel sounds normal; no masses,  no organomegaly  Lab Results:   Recent Labs  09/04/15 0222 09/05/15 0536  WBC 7.2 6.3  HGB 8.1* 8.4*  HCT 24.4* 25.7*  PLT 253 326    BMET  Recent Labs  09/04/15 0222 09/05/15 0536  NA 136 138  K 3.6 3.4*  CL 102 102  CO2 27 26  GLUCOSE 107* 104*  BUN <5* <5*  CREATININE 0.69 0.76  CALCIUM 8.2* 8.4*   PT/INR  Recent Labs  09/02/15 1500  LABPROT 17.3*  INR 1.40     Recent Labs Lab 09/02/15 0920 09/04/15 0222  AST 57* 12*  ALT 17 9*  ALKPHOS 72 57  BILITOT 0.9 0.4  PROT 5.3* 4.5*  ALBUMIN 2.5* 2.1*     Lipase     Component Value Date/Time   LIPASE 99* 09/02/2015 0920     Studies/Results: Mr 3d Recon At Scanner  09/04/2015  ADDENDUM REPORT: 09/04/2015 13:39 ADDENDUM: In discussion with Dr. 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  The splenic vein does not fill with the IV contrast. The superior mesenteric vein is opacified with IV contrast. The proximal portal vein is slightly constricted and the more distal main and LEFT and RIGHT portal vein are patent. Additionally there are mild venous collaterals in the gastrohepatic ligament and again in the splenic hilum. 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 Findings conveyed toJohn Henrene Pastor, [place] 09/04/2015  at13:36. Electronically Signed   By: Suzy Bouchard M.D.   On: 09/04/2015 13:39  09/04/2015  CLINICAL DATA:  Pancreatic cysts. Alcoholic pancreatitis. Pancreatic peripancreatic mass lesion on CT. EXAM: MRI ABDOMEN WITHOUT AND WITH CONTRAST (INCLUDING MRCP) TECHNIQUE: Multiplanar multisequence MR imaging of the abdomen was performed both before and after the administration of intravenous contrast. Heavily T2-weighted images of the biliary and pancreatic ducts were obtained, and three-dimensional MRCP images were rendered by post processing. CONTRAST:  56mL MULTIHANCE GADOBENATE DIMEGLUMINE 529 MG/ML IV SOLN COMPARISON:  CT 09/02/2015, 04/25/2013 FINDINGS: Lower chest:  Lung bases are clear. Hepatobiliary: Multiple nonenhancing cysts within the LEFT and RIGHT hepatic lobe. No focal enhancing hepatic lesion. No biliary duct dilatation. Gallbladder is normal. Pancreas: Pancreatic parenchyma is difficult define on the background of retroperitoneal and peritoneal inflammation. The pancreatic duct is chronically dilated. This ductal dilatation seen on image 35, series 1403). There is a communicating bilobed  fluid collection ventral and dorsal to the pancreatic tail. The ventral portion is rounded measuring 5.0 by 4.0 cm corresponds to the questionable mass on CT. This lesion has some peripheral high signal intensity on T1 weighted imaging (image 30, series 1400) indicating blood product or proteinaceous products. No  significant internal enhancement. This lesion communicates to a dorsal fluid collection measuring 3.6 by 2.1 cm which is more typical appearance of a pseudocyst. These bilobed cystic collections communicate through an isthmus on image 28, series 1402. There is a less well organized fluid collection in the LEFT adrenal region measuring 4.2 by 1.7 cm on image 37, series 1403). Spleen: Spleen is mildly enlarged. There venous collaterals in the splenic hilum. Adrenals/urinary tract: Adrenal glands and kidneys are normal. Stomach/Bowel: Stomach and limited of the small bowel is unremarkable Vascular/Lymphatic: Abdominal aortic normal caliber. No retroperitoneal periportal lymphadenopathy. Musculoskeletal: No aggressive osseous lesion IMPRESSION: 1. Lesion in question on comparison CT corresponds to complex nonenhancing mass most consistent with an hemorrhagic or infected pseudocyst. This complex lesion communicates with a more typical pseudocyst ventral to the pancreatic tail. Recommend follow-up CT with contrast in to re-evaluate these complex cystic collections. 2. A second less well organized fluid collection in the LEFT super renal location. 3. Evidence of acute on chronic pancreatitis. 4. No evidence of biliary duct dilatation or enhancing liver lesion. Electronically Signed: By: Suzy Bouchard M.D. On: 09/04/2015 11:03   Mr Jeananne Rama W/wo Cm/mrcp  09/04/2015  ADDENDUM REPORT: 09/04/2015 13:39 ADDENDUM: In discussion with Dr. 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 The splenic vein does not fill with the IV contrast. The superior mesenteric vein is opacified with IV contrast. The proximal portal vein is slightly constricted and the more distal main and LEFT and RIGHT portal vein are patent. Additionally there are mild venous collaterals in the gastrohepatic ligament and again in the splenic hilum. Additional Impression: - occlusion of the splenic vein - perigastric venous collaterals along the lesser  curvature of stomach. Findings conveyed toJohn Henrene Pastor, [place] 09/04/2015  at13:36. Electronically Signed   By: Suzy Bouchard M.D.   On: 09/04/2015 13:39  09/04/2015  CLINICAL DATA:  Pancreatic cysts. Alcoholic pancreatitis. Pancreatic peripancreatic mass lesion on CT. EXAM: MRI ABDOMEN WITHOUT AND WITH CONTRAST (INCLUDING MRCP) TECHNIQUE: Multiplanar multisequence MR imaging of the abdomen was performed both before and after the administration of intravenous contrast. Heavily T2-weighted images of the biliary and pancreatic ducts were obtained, and three-dimensional MRCP images were rendered by post processing. CONTRAST:  28mL MULTIHANCE GADOBENATE DIMEGLUMINE 529 MG/ML IV SOLN COMPARISON:  CT 09/02/2015, 04/25/2013 FINDINGS: Lower chest:  Lung bases are clear. Hepatobiliary: Multiple nonenhancing cysts within the LEFT and RIGHT hepatic lobe. No focal enhancing hepatic lesion. No biliary duct dilatation. Gallbladder is normal. Pancreas: Pancreatic parenchyma is difficult define on the background of retroperitoneal and peritoneal inflammation. The pancreatic duct is chronically dilated. This ductal dilatation seen on image 35, series 1403). There is a communicating bilobed fluid collection ventral and dorsal to the pancreatic tail. The ventral portion is rounded measuring 5.0 by 4.0 cm corresponds to the questionable mass on CT. This lesion has some peripheral high signal intensity on T1 weighted imaging (image 30, series 1400) indicating blood product or proteinaceous products. No significant internal enhancement. This lesion communicates to a dorsal fluid collection measuring 3.6 by 2.1 cm which is more typical appearance of a pseudocyst. These bilobed cystic collections communicate through an isthmus on image 28, series 1402. There is a less well organized  fluid collection in the LEFT adrenal region measuring 4.2 by 1.7 cm on image 37, series 1403). Spleen: Spleen is mildly enlarged. There venous collaterals in  the splenic hilum. Adrenals/urinary tract: Adrenal glands and kidneys are normal. Stomach/Bowel: Stomach and limited of the small bowel is unremarkable Vascular/Lymphatic: Abdominal aortic normal caliber. No retroperitoneal periportal lymphadenopathy. Musculoskeletal: No aggressive osseous lesion IMPRESSION: 1. Lesion in question on comparison CT corresponds to complex nonenhancing mass most consistent with an hemorrhagic or infected pseudocyst. This complex lesion communicates with a more typical pseudocyst ventral to the pancreatic tail. Recommend follow-up CT with contrast in to re-evaluate these complex cystic collections. 2. A second less well organized fluid collection in the LEFT super renal location. 3. Evidence of acute on chronic pancreatitis. 4. No evidence of biliary duct dilatation or enhancing liver lesion. Electronically Signed: By: Suzy Bouchard M.D. On: 09/04/2015 11:03    Medications: . acetaminophen  650 mg Oral Once  . cefTRIAXone (ROCEPHIN)  IV  1 g Intravenous Q24H  . diphenhydrAMINE  25 mg Oral Once  . folic acid  1 mg Oral Daily  . multivitamin with minerals  1 tablet Oral Daily  . pantoprazole  40 mg Oral Q0600  . thiamine  100 mg Oral Daily    Assessment/Plan Splenomegaly and portal vein thrombosis GI bleed Hx of heavy alcohol use  Cirrhosis Chronic pancreatitis with pancreatic pseudocyst Malnutrition/Weight loss Antibiotics:  Day 4 Ceftriaxone DVT:  SCD   Plan: observation and avoid surgery if possible with his cirrhosis.  H/H is stable.     LOS: 3 days    Darneisha Windhorst 09/05/2015

## 2015-09-05 NOTE — Progress Notes (Signed)
PATIENT DETAILS Name: Alexander Schmitt Age: 65 y.o. Sex: male Date of Birth: Mar 26, 1951 Admit Date: 09/02/2015 Admitting Physician Reyne Dumas, MD [id number], Sharyn Lull, NP  Subjective: 1 small bloody bowel movement this morning. But otherwise doing well.  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 Bleeding seems to have slowed down significantly. GI consulted, underwent EGD on 2/26 which did not show any stigmata of bleeding. GI is following, plans are for colonoscopy-to rule out a lower GI bleed source- on Friday.   Active Problems: Acute blood loss anemia: Secondary to above, hemoglobin stable following 3 units of PRBC on admission. Continue to follow CBC and transfuse as needed.  Splenic vein thrombosis: GI following, recommended consideration of splenectomy. Subsequently seen by general surgery, who does not recommend a splenectomy at this time. Continue to follow, await further recommendations from GI and general surgery.   5.6 cm solid mass anterior to the pancreas: Initially seen on CT abdomen-subsequently underwent a MRI abdomen which is suggestive of a hemorrhagic/infected pseudocyst. However patient without any signs of infection, abdomen is nontender-no abdominal pain as well. Follow-supportive care only at this time.   Chronic pancreatitis with Pancreatic pseudocysts: Seen on CT the mid-MRI confirms pseudocysts. Supportive care for now-tolerating diet.   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 On IV Rocephin for SBP prophylaxis. Follow.  Alcohol abuse: last drink one week prior to this admission. No evidence of withdrawal at this time.  Severe malnutrition: Nutrition evaluation  Disposition: Remain inpatient-home once bleeding has stopped and colonoscopy is completed.  Antimicrobial agents  See below  Anti-infectives    Start     Dose/Rate Route Frequency Ordered  Stop   09/02/15 1830  cefTRIAXone (ROCEPHIN) 1 g in dextrose 5 % 50 mL IVPB     1 g 100 mL/hr over 30 Minutes Intravenous Every 24 hours 09/02/15 1808        DVT Prophylaxis:  SCD's  Code Status: Full code   Family Communication None at bedside  Procedures: 2/26>> EGD  CONSULTS:  GI  Time spent 25 minutes-Greater than 50% of this time was spent in counseling, explanation of diagnosis, planning of further management, and coordination of care.  MEDICATIONS: Scheduled Meds: . acetaminophen  650 mg Oral Once  . cefTRIAXone (ROCEPHIN)  IV  1 g Intravenous Q24H  . diphenhydrAMINE  25 mg Oral Once  . folic acid  1 mg Oral Daily  . multivitamin with minerals  1 tablet Oral Daily  . pantoprazole  40 mg Oral Q0600  . thiamine  100 mg Oral Daily   Continuous Infusions:   PRN Meds:.LORazepam **OR** LORazepam, magnesium hydroxide, morphine injection    PHYSICAL EXAM: Vital signs in last 24 hours: Filed Vitals:   09/04/15 1645 09/04/15 2110 09/05/15 0418 09/05/15 1152  BP: 115/86 98/62 90/60  104/66  Pulse: 80 73 77 76  Temp: 97.7 F (36.5 C) 98.8 F (37.1 C) 98.4 F (36.9 C) 98.8 F (37.1 C)  TempSrc: Oral Oral Oral Oral  Resp: 19 18 19 18   Height:      Weight:      SpO2: 100% 100% 100% 100%    Weight change:  Filed Weights   09/02/15 1807  Weight: 61.1 kg (134 lb 11.2 oz)   Body mass index is 18.26 kg/(m^2).   Gen Exam: Awake and alert with clear speech.   Neck: Supple, No JVD.  Chest: B/L Clear.   CVS: S1 S2 Regular, no murmurs.  Abdomen: soft, BS +, non tender, non distended.  Extremities: no edema, lower extremities warm to touch. Neurologic: Non Focal.   Skin: No Rash.   Wounds: N/A.   Intake/Output from previous day:  Intake/Output Summary (Last 24 hours) at 09/05/15 1404 Last data filed at 09/05/15 0933  Gross per 24 hour  Intake    510 ml  Output    200 ml  Net    310 ml     LAB RESULTS: CBC  Recent Labs Lab 09/02/15 0920   09/02/15 1822 09/03/15 0206 09/03/15 0515 09/04/15 0222 09/05/15 0536  WBC 17.9*  --  11.8* 10.4 10.3 7.2 6.3  HGB 8.3*  < > 7.1* 8.1* 8.5* 8.1* 8.4*  HCT 25.0*  < > 22.0* 24.5* 25.3* 24.4* 25.7*  PLT 358  --  194 186 198 253 326  MCV 92.6  --  92.8 91.1 90.4 91.0 90.8  MCH 30.7  --  30.0 30.1 30.4 30.2 29.7  MCHC 33.2  --  32.3 33.1 33.6 33.2 32.7  RDW 13.9  --  14.2 13.9 13.7 13.6 13.5  LYMPHSABS 1.3  --   --   --   --   --   --   MONOABS 2.0*  --   --   --   --   --   --   EOSABS 0.0  --   --   --   --   --   --   BASOSABS 0.0  --   --   --   --   --   --   < > = values in this interval not displayed.  Chemistries   Recent Labs Lab 09/02/15 0920 09/02/15 0937 09/04/15 0222 09/05/15 0536  NA 133* 131* 136 138  K 4.8 4.4 3.6 3.4*  CL 95* 93* 102 102  CO2 22  --  27 26  GLUCOSE 239* 244* 107* 104*  BUN 13 15 <5* <5*  CREATININE 1.05 0.70 0.69 0.76  CALCIUM 8.7*  --  8.2* 8.4*  MG  --   --  1.7 1.8    CBG:  Recent Labs Lab 09/02/15 0900  GLUCAP 166*    GFR Estimated Creatinine Clearance: 81.7 mL/min (by C-G formula based on Cr of 0.76).  Coagulation profile  Recent Labs Lab 09/02/15 1500  INR 1.40    Cardiac Enzymes No results for input(s): CKMB, TROPONINI, MYOGLOBIN in the last 168 hours.  Invalid input(s): CK  Invalid input(s): POCBNP No results for input(s): DDIMER in the last 72 hours. No results for input(s): HGBA1C in the last 72 hours. No results for input(s): CHOL, HDL, LDLCALC, TRIG, CHOLHDL, LDLDIRECT in the last 72 hours. No results for input(s): TSH, T4TOTAL, T3FREE, THYROIDAB in the last 72 hours.  Invalid input(s): FREET3  Recent Labs  09/02/15 1822  VITAMINB12 163*  FOLATE 10.1  FERRITIN 179  TIBC 176*  IRON 16*   No results for input(s): LIPASE, AMYLASE in the last 72 hours.  Urine Studies No results for input(s): UHGB, CRYS in the last 72 hours.  Invalid input(s): UACOL, UAPR, USPG, UPH, UTP, UGL, UKET, UBIL, UNIT,  UROB, ULEU, UEPI, UWBC, URBC, UBAC, CAST, UCOM, BILUA  MICROBIOLOGY: Recent Results (from the past 240 hour(s))  MRSA PCR Screening     Status: None   Collection Time: 09/02/15  6:10 PM  Result Value Ref Range Status   MRSA by PCR NEGATIVE  NEGATIVE Final    Comment:        The GeneXpert MRSA Assay (FDA approved for NASAL specimens only), is one component of a comprehensive MRSA colonization surveillance program. It is not intended to diagnose MRSA infection nor to guide or monitor treatment for MRSA infections.     RADIOLOGY STUDIES/RESULTS: Ct Abdomen Pelvis W Contrast  09/02/2015  CLINICAL DATA:  Abdominal pain, discomfort for 2-3 days EXAM: CT ABDOMEN AND PELVIS WITH CONTRAST TECHNIQUE: Multidetector CT imaging of the abdomen and pelvis was performed using the standard protocol following bolus administration of intravenous contrast. CONTRAST:  140mL OMNIPAQUE IOHEXOL 300 MG/ML  SOLN COMPARISON:  Ultrasound 01/02/2015.  CT 05/03/2013. FINDINGS: Lung bases are clear. No effusions. Heart is normal size. Coronary artery calcifications. Small hypodensities scattered throughout the liver, similar prior CT compatible with cysts. Spleen, adrenals are unremarkable. Small benign cyst in the lower pole of the right kidney. No hydronephrosis. Diffuse calcifications throughout the pancreas as seen on prior CT compatible with chronic calcific pancreatitis. In addition, there are inflammatory changes around the pancreas and in the left anterior para renal space compatible with acute pancreatitis. Fluid collection posterior to the pancreatic tail in the left upper quadrant measures up to 3.3 cm, likely developing pseudo cyst. Smaller adjacent 11 mm cystic area. Anterior to the pancreas is a solid-appearing mass measuring 5.6 x 4.6 cm. This was not present on prior study nor seen on prior ultrasound. Bowel grossly unremarkable. Small amount of free fluid in the pelvis. No free air. No adenopathy. Urinary  bladder is grossly unremarkable. Aorta is calcified, non aneurysmal. IMPRESSION: Changes of calcific pancreatitis. Inflammatory changes around the pancreas compatible with acute pancreatitis. 3.3 cm an 11 mm cystic areas adjacent to the pancreatic tail compatible with pseudocysts. 5.6 cm solid-appearing mass anterior to the pancreas. This conceivably could represent a hemorrhagic pseudo cyst, but a solid pancreatic mass or mass arising from adjacent structures cannot be completely excluded such as gist tumor. This should be further evaluated with MRI with and without contrast after acute symptoms abate and patient can tolerate the study. Hepatic cysts. Electronically Signed   By: Rolm Baptise M.D.   On: 09/02/2015 11:28   Mr 3d Recon At Scanner  09/04/2015  ADDENDUM REPORT: 09/04/2015 13:39 ADDENDUM: In discussion with Dr. 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 The splenic vein does not fill with the IV contrast. The superior mesenteric vein is opacified with IV contrast. The proximal portal vein is slightly constricted and the more distal main and LEFT and RIGHT portal vein are patent. Additionally there are mild venous collaterals in the gastrohepatic ligament and again in the splenic hilum. 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 Findings conveyed toJohn Henrene Pastor, [place] 09/04/2015  at13:36. Electronically Signed   By: Suzy Bouchard M.D.   On: 09/04/2015 13:39  09/04/2015  CLINICAL DATA:  Pancreatic cysts. Alcoholic pancreatitis. Pancreatic peripancreatic mass lesion on CT. EXAM: MRI ABDOMEN WITHOUT AND WITH CONTRAST (INCLUDING MRCP) TECHNIQUE: Multiplanar multisequence MR imaging of the abdomen was performed both before and after the administration of intravenous contrast. Heavily T2-weighted images of the biliary and pancreatic ducts were obtained, and three-dimensional MRCP images were rendered by post  processing. CONTRAST:  79mL MULTIHANCE GADOBENATE DIMEGLUMINE 529 MG/ML IV SOLN COMPARISON:  CT 09/02/2015, 04/25/2013 FINDINGS: Lower chest:  Lung bases are clear. Hepatobiliary: Multiple nonenhancing cysts within the LEFT and RIGHT hepatic lobe. No focal enhancing hepatic  lesion. No biliary duct dilatation. Gallbladder is normal. Pancreas: Pancreatic parenchyma is difficult define on the background of retroperitoneal and peritoneal inflammation. The pancreatic duct is chronically dilated. This ductal dilatation seen on image 35, series 1403). There is a communicating bilobed fluid collection ventral and dorsal to the pancreatic tail. The ventral portion is rounded measuring 5.0 by 4.0 cm corresponds to the questionable mass on CT. This lesion has some peripheral high signal intensity on T1 weighted imaging (image 30, series 1400) indicating blood product or proteinaceous products. No significant internal enhancement. This lesion communicates to a dorsal fluid collection measuring 3.6 by 2.1 cm which is more typical appearance of a pseudocyst. These bilobed cystic collections communicate through an isthmus on image 28, series 1402. There is a less well organized fluid collection in the LEFT adrenal region measuring 4.2 by 1.7 cm on image 37, series 1403). Spleen: Spleen is mildly enlarged. There venous collaterals in the splenic hilum. Adrenals/urinary tract: Adrenal glands and kidneys are normal. Stomach/Bowel: Stomach and limited of the small bowel is unremarkable Vascular/Lymphatic: Abdominal aortic normal caliber. No retroperitoneal periportal lymphadenopathy. Musculoskeletal: No aggressive osseous lesion IMPRESSION: 1. Lesion in question on comparison CT corresponds to complex nonenhancing mass most consistent with an hemorrhagic or infected pseudocyst. This complex lesion communicates with a more typical pseudocyst ventral to the pancreatic tail. Recommend follow-up CT with contrast in to re-evaluate these  complex cystic collections. 2. A second less well organized fluid collection in the LEFT super renal location. 3. Evidence of acute on chronic pancreatitis. 4. No evidence of biliary duct dilatation or enhancing liver lesion. Electronically Signed: By: Suzy Bouchard M.D. On: 09/04/2015 11:03   Dg Chest Port 1 View  09/02/2015  CLINICAL DATA:  Weakness.  Abdominal pain, rectal bleeding, nausea. EXAM: PORTABLE CHEST 1 VIEW COMPARISON:  07/25/2012 FINDINGS: The heart size and mediastinal contours are within normal limits. Both lungs are clear. The visualized skeletal structures are unremarkable. IMPRESSION: No active disease. Electronically Signed   By: Rolm Baptise M.D.   On: 09/02/2015 10:00   Mr Abd W/wo Cm/mrcp  09/04/2015  ADDENDUM REPORT: 09/04/2015 13:39 ADDENDUM: In discussion with Dr. 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 The splenic vein does not fill with the IV contrast. The superior mesenteric vein is opacified with IV contrast. The proximal portal vein is slightly constricted and the more distal main and LEFT and RIGHT portal vein are patent. Additionally there are mild venous collaterals in the gastrohepatic ligament and again in the splenic hilum. 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 Findings conveyed toJohn Henrene Pastor, [place] 09/04/2015  at13:36. Electronically Signed   By: Suzy Bouchard M.D.   On: 09/04/2015 13:39  09/04/2015  CLINICAL DATA:  Pancreatic cysts. Alcoholic pancreatitis. Pancreatic peripancreatic mass lesion on CT. EXAM: MRI ABDOMEN WITHOUT AND WITH CONTRAST (INCLUDING MRCP) TECHNIQUE: Multiplanar multisequence MR imaging of the abdomen was performed both before and after the administration of intravenous contrast. Heavily T2-weighted images of the biliary and pancreatic ducts were obtained, and three-dimensional MRCP images were rendered by post processing. CONTRAST:  34mL  MULTIHANCE GADOBENATE DIMEGLUMINE 529 MG/ML IV SOLN COMPARISON:  CT 09/02/2015, 04/25/2013 FINDINGS: Lower chest:  Lung bases are clear. Hepatobiliary: Multiple nonenhancing cysts within the LEFT and RIGHT hepatic lobe. No focal enhancing hepatic lesion. No biliary duct dilatation. Gallbladder is normal. Pancreas: Pancreatic parenchyma is difficult define on the background of retroperitoneal and peritoneal inflammation. The pancreatic duct  is chronically dilated. This ductal dilatation seen on image 35, series 1403). There is a communicating bilobed fluid collection ventral and dorsal to the pancreatic tail. The ventral portion is rounded measuring 5.0 by 4.0 cm corresponds to the questionable mass on CT. This lesion has some peripheral high signal intensity on T1 weighted imaging (image 30, series 1400) indicating blood product or proteinaceous products. No significant internal enhancement. This lesion communicates to a dorsal fluid collection measuring 3.6 by 2.1 cm which is more typical appearance of a pseudocyst. These bilobed cystic collections communicate through an isthmus on image 28, series 1402. There is a less well organized fluid collection in the LEFT adrenal region measuring 4.2 by 1.7 cm on image 37, series 1403). Spleen: Spleen is mildly enlarged. There venous collaterals in the splenic hilum. Adrenals/urinary tract: Adrenal glands and kidneys are normal. Stomach/Bowel: Stomach and limited of the small bowel is unremarkable Vascular/Lymphatic: Abdominal aortic normal caliber. No retroperitoneal periportal lymphadenopathy. Musculoskeletal: No aggressive osseous lesion IMPRESSION: 1. Lesion in question on comparison CT corresponds to complex nonenhancing mass most consistent with an hemorrhagic or infected pseudocyst. This complex lesion communicates with a more typical pseudocyst ventral to the pancreatic tail. Recommend follow-up CT with contrast in to re-evaluate these complex cystic collections. 2.  A second less well organized fluid collection in the LEFT super renal location. 3. Evidence of acute on chronic pancreatitis. 4. No evidence of biliary duct dilatation or enhancing liver lesion. Electronically Signed: By: Suzy Bouchard M.D. On: 09/04/2015 11:03    Oren Binet, MD  [facility] Pager:[phone]  If 7PM-7AM, please contact night-coverage [email or web address] Password [email or web address] 09/05/2015, 2:04 PM   LOS: 3 days

## 2015-09-05 NOTE — Progress Notes (Signed)
Daily Rounding Note  09/05/2015, 10:02 AM  LOS: 3 days   SUBJECTIVE:       Feels well.  Stools large, mostly brown but leaching blood into commode water.  No pain, no fever.  Fells well.    OBJECTIVE:         Vital signs in last 24 hours:    Temp:  [97.7 F (36.5 C)-98.8 F (37.1 C)] 98.4 F (36.9 C) (03/01 0418) Pulse Rate:  [72-80] 77 (03/01 0418) Resp:  [18-20] 19 (03/01 0418) BP: (90-115)/(60-90) 90/60 mmHg (03/01 0418) SpO2:  [98 %-100 %] 100 % (03/01 0418) Last BM Date: 09/03/15 Filed Weights   09/02/15 1807  Weight: 61.1 kg (134 lb 11.2 oz)   General: pleasant, looks comfortable and not acutely ill   Heart: RRR.  No mrg Chest: clear bil Abdomen: soft, NT, ND.  Active BS  Extremities: no CCE Neuro/Psych:  Pleasant, cooperative, alert/oriented x 3.    Intake/Output from previous day: 02/28 0701 - 03/01 0700 In: 990 [P.O.:990] Out: 200 [Urine:200]  Intake/Output this shift: Total I/O In: 360 [P.O.:360] Out: -   Lab Results:  Recent Labs  09/03/15 0515 09/04/15 0222 09/05/15 0536  WBC 10.3 7.2 6.3  HGB 8.5* 8.1* 8.4*  HCT 25.3* 24.4* 25.7*  PLT 198 253 326   BMET  Recent Labs  09/04/15 0222 09/05/15 0536  NA 136 138  K 3.6 3.4*  CL 102 102  CO2 27 26  GLUCOSE 107* 104*  BUN <5* <5*  CREATININE 0.69 0.76  CALCIUM 8.2* 8.4*   LFT  Recent Labs  09/04/15 0222  PROT 4.5*  ALBUMIN 2.1*  AST 12*  ALT 9*  ALKPHOS 57  BILITOT 0.4   PT/INR  Recent Labs  09/02/15 1500  LABPROT 17.3*  INR 1.40   Hepatitis Panel No results for input(s): HEPBSAG, HCVAB, HEPAIGM, HEPBIGM in the last 72 hours.  Studies/Results: Mr 3d Recon At Scanner  09/04/2015  ADDENDUM REPORT: 09/04/2015 13:39 ADDENDUM: In discussion with Dr. 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 The splenic vein does not fill with the IV contrast. The superior mesenteric vein is opacified with IV contrast. The  proximal portal vein is slightly constricted and the more distal main and LEFT and RIGHT portal vein are patent. Additionally there are mild venous collaterals in the gastrohepatic ligament and again in the splenic hilum. 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 Findings conveyed toJohn Henrene Pastor, [place] 09/04/2015  at13:36. Electronically Signed   By: Suzy Bouchard M.D.   On: 09/04/2015 13:39  09/04/2015  CLINICAL DATA:  Pancreatic cysts. Alcoholic pancreatitis. Pancreatic peripancreatic mass lesion on CT. EXAM: MRI ABDOMEN WITHOUT AND WITH CONTRAST (INCLUDING MRCP) TECHNIQUE: Multiplanar multisequence MR imaging of the abdomen was performed both before and after the administration of intravenous contrast. Heavily T2-weighted images of the biliary and pancreatic ducts were obtained, and three-dimensional MRCP images were rendered by post processing. CONTRAST:  10mL MULTIHANCE GADOBENATE DIMEGLUMINE 529 MG/ML IV SOLN COMPARISON:  CT 09/02/2015, 04/25/2013 FINDINGS: Lower chest:  Lung bases are clear. Hepatobiliary: Multiple nonenhancing cysts within the LEFT and RIGHT hepatic lobe. No focal enhancing hepatic lesion. No biliary duct dilatation. Gallbladder is normal. Pancreas: Pancreatic parenchyma is difficult define on the background of retroperitoneal and peritoneal inflammation. The pancreatic duct is chronically dilated. This ductal dilatation seen on image 35, series 1403). There is a communicating bilobed  fluid collection ventral and dorsal to the pancreatic tail. The ventral portion is rounded measuring 5.0 by 4.0 cm corresponds to the questionable mass on CT. This lesion has some peripheral high signal intensity on T1 weighted imaging (image 30, series 1400) indicating blood product or proteinaceous products. No significant internal enhancement. This lesion communicates to a dorsal fluid collection measuring 3.6 by 2.1 cm  which is more typical appearance of a pseudocyst. These bilobed cystic collections communicate through an isthmus on image 28, series 1402. There is a less well organized fluid collection in the LEFT adrenal region measuring 4.2 by 1.7 cm on image 37, series 1403). Spleen: Spleen is mildly enlarged. There venous collaterals in the splenic hilum. Adrenals/urinary tract: Adrenal glands and kidneys are normal. Stomach/Bowel: Stomach and limited of the small bowel is unremarkable Vascular/Lymphatic: Abdominal aortic normal caliber. No retroperitoneal periportal lymphadenopathy. Musculoskeletal: No aggressive osseous lesion IMPRESSION: 1. Lesion in question on comparison CT corresponds to complex nonenhancing mass most consistent with an hemorrhagic or infected pseudocyst. This complex lesion communicates with a more typical pseudocyst ventral to the pancreatic tail. Recommend follow-up CT with contrast in to re-evaluate these complex cystic collections. 2. A second less well organized fluid collection in the LEFT super renal location. 3. Evidence of acute on chronic pancreatitis. 4. No evidence of biliary duct dilatation or enhancing liver lesion. Electronically Signed: By: Suzy Bouchard M.D. On: 09/04/2015 11:03   Mr Jeananne Rama W/wo Cm/mrcp  09/04/2015  ADDENDUM REPORT: 09/04/2015 13:39 ADDENDUM: In discussion with Dr. 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 The splenic vein does not fill with the IV contrast. The superior mesenteric vein is opacified with IV contrast. The proximal portal vein is slightly constricted and the more distal main and LEFT and RIGHT portal vein are patent. Additionally there are mild venous collaterals in the gastrohepatic ligament and again in the splenic hilum. 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 Findings conveyed toJohn Henrene Pastor, [place] 09/04/2015  at13:36. Electronically Signed   By: Suzy Bouchard M.D.   On: 09/04/2015 13:39  09/04/2015  CLINICAL DATA:  Pancreatic cysts. Alcoholic pancreatitis. Pancreatic peripancreatic mass lesion on CT. EXAM: MRI ABDOMEN WITHOUT AND WITH CONTRAST (INCLUDING MRCP) TECHNIQUE: Multiplanar multisequence MR imaging of the abdomen was performed both before and after the administration of intravenous contrast. Heavily T2-weighted images of the biliary and pancreatic ducts were obtained, and three-dimensional MRCP images were rendered by post processing. CONTRAST:  79mL MULTIHANCE GADOBENATE DIMEGLUMINE 529 MG/ML IV SOLN COMPARISON:  CT 09/02/2015, 04/25/2013 FINDINGS: Lower chest:  Lung bases are clear. Hepatobiliary: Multiple nonenhancing cysts within the LEFT and RIGHT hepatic lobe. No focal enhancing hepatic lesion. No biliary duct dilatation. Gallbladder is normal. Pancreas: Pancreatic parenchyma is difficult define on the background of retroperitoneal and peritoneal inflammation. The pancreatic duct is chronically dilated. This ductal dilatation seen on image 35, series 1403). There is a communicating bilobed fluid collection ventral and dorsal to the pancreatic tail. The ventral portion is rounded measuring 5.0 by 4.0 cm corresponds to the questionable mass on CT. This lesion has some peripheral high signal intensity on T1 weighted imaging (image 30, series 1400) indicating blood product or proteinaceous products. No significant internal enhancement. This lesion communicates to a dorsal fluid collection measuring 3.6 by 2.1 cm which is more typical appearance of a pseudocyst. These bilobed cystic collections communicate through an isthmus on image 28, series 1402. There is a less well organized  fluid collection in the LEFT adrenal region measuring 4.2 by 1.7 cm on image 37, series 1403). Spleen: Spleen is mildly enlarged. There venous collaterals in the splenic hilum. Adrenals/urinary tract: Adrenal glands and kidneys are normal. Stomach/Bowel: Stomach and limited  of the small bowel is unremarkable Vascular/Lymphatic: Abdominal aortic normal caliber. No retroperitoneal periportal lymphadenopathy. Musculoskeletal: No aggressive osseous lesion IMPRESSION: 1. Lesion in question on comparison CT corresponds to complex nonenhancing mass most consistent with an hemorrhagic or infected pseudocyst. This complex lesion communicates with a more typical pseudocyst ventral to the pancreatic tail. Recommend follow-up CT with contrast in to re-evaluate these complex cystic collections. 2. A second less well organized fluid collection in the LEFT super renal location. 3. Evidence of acute on chronic pancreatitis. 4. No evidence of biliary duct dilatation or enhancing liver lesion. Electronically Signed: By: Suzy Bouchard M.D. On: 09/04/2015 11:03    ASSESMENT:   * Bleeding PR. Dark stool with red blood. 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 normal ampulla. No active bleeding. Octreotide stopped 2/27 after 2 days. On daily oral PPI.   * Chronic pancreatitis.  06/2015 acute, recurrent ETOH pancreatitis and pseudocyst. Calcification c/w chronic pancreatitis. Solid appearing pancreatic mass on 2/26 CT; by 2/28 MRI/MRCP:  hemorrhagic vs infected pseudocyst.  Pt has no fever or abd pain.   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  Dr Henrene Pastor suspects this caused isolated gastric varices.   Surgery consult re: splenectomy.  They rec avoiding splenectomy in a cirrhotic Appreciate ID guidance re: pre splenectomy vaccinations in case he does go to splenectomy.   * Cirrhosis. Hx ascites: 4 liter paracentesis 05/2012, no SBP. Platelets normal. PT increased to 17.3.   * Alcoholism. Not in remission.   * Anemia, normocytic. S/p PRBC x 2. Hgb stable. ? If only due to GI blood loss? Hgb 06/2015: 13.0. Iron and iron sat low, ferritin 179. B 12  low.   * DM. Glucose to 244.   * SBO 11/2005.    *  Hypokalemia   PLAN   *  Per Dr Henrene Pastor.   *   potassium correction per attending.   *  If no plans for surgery or further testing, anticipate discharge within next 24 hours.   Azucena Freed  09/05/2015, 10:02 AM Pager: [phone]  GI ATTENDING  Interval history and data reviewed. Patient personally seen and examined. Agree with interval progress note.  IMPRESSIONS 1. Acute GI bleeding. Upper endoscopy with isolated gastric varices. No blood in upper gut 2. Acute on chronic pancreatitis with complex pseudocyst 3. 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 Chronic alcoholism. No evidence for significant alcohol-related liver disease. He does NOT have portal hypertension, thus TIPS would NOT be appropriate 5. More red blood with recent bowel movements without acute bleeding. Rule out colonic lesion PLANS 1. If gastric varices were were determined to cause bleeding, splenectomy would be the treatment of choice 2. Plan colonoscopy to rule out lower GI source for bleeding. This will be set up for Friday with MAC. Discussed with patient in detail.The nature of the procedure, as well as the risks, benefits, and alternatives were carefully and thoroughly reviewed with the patient. Ample time for discussion and questions allowed. The patient understood, was satisfied, and agreed to proceed.  Docia Chuck. Geri [place]in[place]., M.D. [facility]

## 2015-09-06 ENCOUNTER — Encounter (HOSPITAL_COMMUNITY): Payer: Self-pay | Admitting: Physician Assistant

## 2015-09-06 DIAGNOSIS — E538 Deficiency of other specified B group vitamins: Secondary | ICD-10-CM

## 2015-09-06 LAB — HEPATITIS B SURFACE ANTIGEN: HEP B S AG: NEGATIVE

## 2015-09-06 LAB — HCV COMMENT:

## 2015-09-06 LAB — TYPE AND SCREEN
ABO/RH(D): O POS
ANTIBODY SCREEN: NEGATIVE
UNIT DIVISION: 0
UNIT DIVISION: 0
Unit division: 0

## 2015-09-06 LAB — CBC
HCT: 25.4 % — ABNORMAL LOW (ref 39.0–52.0)
Hemoglobin: 8.6 g/dL — ABNORMAL LOW (ref 13.0–17.0)
MCH: 30.8 pg (ref 26.0–34.0)
MCHC: 33.9 g/dL (ref 30.0–36.0)
MCV: 91 fL (ref 78.0–100.0)
PLATELETS: 334 10*3/uL (ref 150–400)
RBC: 2.79 MIL/uL — ABNORMAL LOW (ref 4.22–5.81)
RDW: 13.7 % (ref 11.5–15.5)
WBC: 7.2 10*3/uL (ref 4.0–10.5)

## 2015-09-06 LAB — HEPATITIS C ANTIBODY (REFLEX)

## 2015-09-06 LAB — HEPATITIS B SURFACE ANTIBODY, QUANTITATIVE: Hepatitis B-Post: <3.1 m[IU]/mL — ABNORMAL LOW (ref >9.9)

## 2015-09-06 MED ORDER — PEG-KCL-NACL-NASULF-NA ASC-C 100 G PO SOLR
0.5000 | Freq: Once | ORAL | Status: AC
  Administered 2015-09-07: 100 g via ORAL

## 2015-09-06 MED ORDER — CYANOCOBALAMIN 1000 MCG/ML IJ SOLN
1000.0000 ug | Freq: Every day | INTRAMUSCULAR | Status: DC
Start: 2015-09-06 — End: 2015-09-07
  Administered 2015-09-06: 1000 ug via INTRAMUSCULAR
  Filled 2015-09-06 (×3): qty 1

## 2015-09-06 MED ORDER — BOOST / RESOURCE BREEZE PO LIQD
1.0000 | Freq: Three times a day (TID) | ORAL | Status: DC
  Administered 2015-09-06 – 2015-09-07 (×2): 1 via ORAL

## 2015-09-06 MED ORDER — PEG-KCL-NACL-NASULF-NA ASC-C 100 G PO SOLR
0.5000 | Freq: Once | ORAL | Status: AC
  Administered 2015-09-06: 100 g via ORAL
  Filled 2015-09-06: qty 1

## 2015-09-06 NOTE — Care Management Note (Signed)
Case Management Note  Patient Details  Name: GOBEL ASLIN MRN: [id number] Date of Birth: 1951/07/03  Subjective/Objective:                    Action/Plan:   Expected Discharge Date:  09/06/15               Expected Discharge Plan:  Home/Self Care  In-House Referral:     Discharge planning Services     Post Acute Care Choice:    Choice offered to:     DME Arranged:    DME Agency:     HH Arranged:    HH Agency:     Status of Service:  In process, will continue to follow  Medicare Important Message Given:    Date Medicare IM Given:    Medicare IM give by:    Date Additional Medicare IM Given:    Additional Medicare Important Message give by:     If discussed at [place] of Stay Meetings, dates discussed:    Additional Comments: UR updated  Marilu Favre, RN 09/06/2015, 10:40 AM

## 2015-09-06 NOTE — Progress Notes (Signed)
Initial Nutrition Assessment  DOCUMENTATION CODES:   Severe malnutrition in context of social or environmental circumstances  INTERVENTION:  -Provide Boost Breeze TID, 250 kcal, 9 grams of protein per bottle -Continue MVI daily  NUTRITION DIAGNOSIS:   Malnutrition related to social / environmental circumstances as evidenced by energy intake < or equal to 50% for > or equal to 1 month, severe depletion of muscle mass, severe depletion of body fat.  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  Pt seen for low BMI. Pt states his appetite is good and reported being hungry during time of visit. PTA pt's appetite was poor. He was only consuming 1- 1.5 meals daily, due to abdominal pain. Pt's decrease appetite was ongoing for one month. Pt reports drinking Ensure Enlive at home. Pt does not take multivitamins at home, but asked about what multivitamins are appropriate to use at home. RD provided suggestions for over the counter multivitamins. Pt was agreeable to consuming boost breeze TID, to help meet nutritional needs.   Pt reports a 15 lb weight loss over 1 month. Pt reports a usual body weight of 170 lb. Weight history within past year are not significant for time frame per chart.  Conducted nutrition focused physical exam, identified moderate/severe muscle and fat depletion.    Medications reviewed; cyanocobalamin 123XX123 mcg daily, Folic acid 1 mg daily, multivitamin 1 tablet daily, thiamine 100 mg daily.   Labs reviewed; potassium 3.4  Diet Order:   Diet clear liquid Room service appropriate?: Yes; Fluid consistency:: Thin Diet NPO time specified  Skin:  Reviewed, no issues  Last BM:  09/06/2015  Height:   Ht Readings from Last 1 Encounters:  09/02/15 6' (1.829 m)    Weight:   Wt Readings from Last 1 Encounters:  09/02/15 134 lb 11.2 oz (61.1 kg)    Ideal Body Weight:  80.9 kg  BMI:  Body mass index is 18.26 kg/(m^2).  Estimated Nutritional Needs:   Kcal:  1700-1900  Protein:  70-85 grams   Fluid:  1.7-1.9 L  EDUCATION NEEDS:   No education needs identified at this time  [place], Dietetic Intern Pager: [phone]

## 2015-09-06 NOTE — Progress Notes (Signed)
PATIENT DETAILS Name: Alexander Schmitt Age: 65 y.o. Sex: male Date of Birth: 1950/08/14 Admit Date: 09/02/2015 Admitting Physician Reyne Dumas, MD LRJ:[id number], Sharyn Lull, NP  Subjective: No hematochezia since yesterday.  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 Bleeding seems to have resolved. GI consulted, underwent EGD on 2/26 which did not show any stigmata of bleeding. GI is following, plans are for colonoscopy-to rule out a lower GI bleed source- on 3/4.   Active Problems: Acute blood loss anemia: Secondary to above, hemoglobin stable following 3 units of PRBC on admission. Continue to follow CBC and transfuse as needed.  Splenic vein thrombosis: GI following, recommended consideration of splenectomy. Subsequently seen by general surgery, who does not recommend a splenectomy at this time.   5.6 cm solid mass anterior to the pancreas: Initially seen on CT abdomen-subsequently underwent a MRI abdomen which is suggestive of a hemorrhagic/infected pseudocyst. However patient without any signs of infection, abdomen is nontender-no abdominal pain as well. Follow-supportive care only at this time. Will need outpatient follow-up.   Vitamin B12 deficiency: Continue vitamin B12 supplementation.  Chronic pancreatitis with Pancreatic pseudocysts: Seen on CT the mid-MRI confirms pseudocysts. Supportive care for now-tolerating diet.   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 On IV Rocephin for SBP prophylaxis. Follow.  Alcohol abuse: last drink one week prior to this admission. No evidence of withdrawal at this time.  Severe malnutrition: Nutrition evaluation  Disposition: Remain inpatient-home once bleeding has stopped and colonoscopy is completed.  Antimicrobial agents  See below  Anti-infectives    Start     Dose/Rate Route Frequency Ordered Stop   09/02/15 1830  cefTRIAXone  (ROCEPHIN) 1 g in dextrose 5 % 50 mL IVPB     1 g 100 mL/hr over 30 Minutes Intravenous Every 24 hours 09/02/15 1808 09/07/15 1829      DVT Prophylaxis:  SCD's  Code Status: Full code   Family Communication None at bedside  Procedures: 2/26>> EGD  CONSULTS:  GI  Time spent 25 minutes-Greater than 50% of this time was spent in counseling, explanation of diagnosis, planning of further management, and coordination of care.  MEDICATIONS: Scheduled Meds: . acetaminophen  650 mg Oral Once  . bisacodyl  10 mg Oral BID  . cefTRIAXone (ROCEPHIN)  IV  1 g Intravenous Q24H  . cyanocobalamin  1,000 mcg Intramuscular Daily  . diphenhydrAMINE  25 mg Oral Once  . folic acid  1 mg Oral Daily  . multivitamin with minerals  1 tablet Oral Daily  . pantoprazole  40 mg Oral Q0600  . [START ON 09/07/2015] peg 3350 powder  0.5 kit Oral Once   And  . peg 3350 powder  0.5 kit Oral Once  . thiamine  100 mg Oral Daily   Continuous Infusions:   PRN Meds:.magnesium hydroxide, morphine injection    PHYSICAL EXAM: Vital signs in last 24 hours: Filed Vitals:   09/05/15 1152 09/05/15 1800 09/05/15 2259 09/06/15 0510  BP: 104/66 109/66 115/64 91/59  Pulse: 76 74 76 70  Temp: 98.8 F (37.1 C) 98.5 F (36.9 C) 98.3 F (36.8 C) 98.2 F (36.8 C)  TempSrc: Oral Oral Oral Oral  Resp: '18 18 18 18  ' Height:      Weight:      SpO2: 100% 100% 100% 99%    Weight change:  Filed Weights   09/02/15 1807  Weight: 61.1 kg (134 lb 11.2 oz)   Body mass index is 18.26 kg/(m^2).   Gen Exam: Awake and alert with clear speech.   Neck: Supple, No JVD.   Chest: B/L Clear.   CVS: S1 S2 Regular, no murmurs.  Abdomen: soft, BS +, non tender, non distended.  Extremities: no edema, lower extremities warm to touch. Neurologic: Non Focal.   Skin: No Rash.   Wounds: N/A.   Intake/Output from previous day:  Intake/Output Summary (Last 24 hours) at 09/06/15 1435 Last data filed at 09/06/15 0511  Gross  per 24 hour  Intake    290 ml  Output    475 ml  Net   -185 ml     LAB RESULTS: CBC  Recent Labs Lab 09/02/15 0920  09/03/15 0515 09/04/15 0222 09/05/15 0536 09/05/15 1558 09/06/15 0530  WBC 17.9*  < > 10.3 7.2 6.3 5.6 7.2  HGB 8.3*  < > 8.5* 8.1* 8.4* 7.9* 8.6*  HCT 25.0*  < > 25.3* 24.4* 25.7* 24.5* 25.4*  PLT 358  < > 198 253 326 319 334  MCV 92.6  < > 90.4 91.0 90.8 91.4 91.0  MCH 30.7  < > 30.4 30.2 29.7 29.5 30.8  MCHC 33.2  < > 33.6 33.2 32.7 32.2 33.9  RDW 13.9  < > 13.7 13.6 13.5 13.6 13.7  LYMPHSABS 1.3  --   --   --   --   --   --   MONOABS 2.0*  --   --   --   --   --   --   EOSABS 0.0  --   --   --   --   --   --   BASOSABS 0.0  --   --   --   --   --   --   < > = values in this interval not displayed.  Chemistries   Recent Labs Lab 09/02/15 0920 09/02/15 0937 09/04/15 0222 09/05/15 0536  NA 133* 131* 136 138  K 4.8 4.4 3.6 3.4*  CL 95* 93* 102 102  CO2 22  --  27 26  GLUCOSE 239* 244* 107* 104*  BUN 13 15 <5* <5*  CREATININE 1.05 0.70 0.69 0.76  CALCIUM 8.7*  --  8.2* 8.4*  MG  --   --  1.7 1.8    CBG:  Recent Labs Lab 09/02/15 0900  GLUCAP 166*    GFR Estimated Creatinine Clearance: 81.7 mL/min (by C-G formula based on Cr of 0.76).  Coagulation profile  Recent Labs Lab 09/02/15 1500  INR 1.40    Cardiac Enzymes No results for input(s): CKMB, TROPONINI, MYOGLOBIN in the last 168 hours.  Invalid input(s): CK  Invalid input(s): POCBNP No results for input(s): DDIMER in the last 72 hours. No results for input(s): HGBA1C in the last 72 hours. No results for input(s): CHOL, HDL, LDLCALC, TRIG, CHOLHDL, LDLDIRECT in the last 72 hours. No results for input(s): TSH, T4TOTAL, T3FREE, THYROIDAB in the last 72 hours.  Invalid input(s): FREET3 No results for input(s): VITAMINB12, FOLATE, FERRITIN, TIBC, IRON, RETICCTPCT in the last 72 hours. No results for input(s): LIPASE, AMYLASE in the last 72 hours.  Urine Studies No results  for input(s): UHGB, CRYS in the last 72 hours.  Invalid input(s): UACOL, UAPR, USPG, UPH, UTP, UGL, UKET, UBIL, UNIT, UROB, ULEU, UEPI, UWBC, URBC, UBAC, CAST, UCOM, BILUA  MICROBIOLOGY: Recent Results (from the past 240 hour(s))  MRSA PCR Screening  Status: None   Collection Time: 09/02/15  6:10 PM  Result Value Ref Range Status   MRSA by PCR NEGATIVE NEGATIVE Final    Comment:        The GeneXpert MRSA Assay (FDA approved for NASAL specimens only), is one component of a comprehensive MRSA colonization surveillance program. It is not intended to diagnose MRSA infection nor to guide or monitor treatment for MRSA infections.     RADIOLOGY STUDIES/RESULTS: Ct Abdomen Pelvis W Contrast  09/02/2015  CLINICAL DATA:  Abdominal pain, discomfort for 2-3 days EXAM: CT ABDOMEN AND PELVIS WITH CONTRAST TECHNIQUE: Multidetector CT imaging of the abdomen and pelvis was performed using the standard protocol following bolus administration of intravenous contrast. CONTRAST:  18m OMNIPAQUE IOHEXOL 300 MG/ML  SOLN COMPARISON:  Ultrasound 01/02/2015.  CT 05/03/2013. FINDINGS: Lung bases are clear. No effusions. Heart is normal size. Coronary artery calcifications. Small hypodensities scattered throughout the liver, similar prior CT compatible with cysts. Spleen, adrenals are unremarkable. Small benign cyst in the lower pole of the right kidney. No hydronephrosis. Diffuse calcifications throughout the pancreas as seen on prior CT compatible with chronic calcific pancreatitis. In addition, there are inflammatory changes around the pancreas and in the left anterior para renal space compatible with acute pancreatitis. Fluid collection posterior to the pancreatic tail in the left upper quadrant measures up to 3.3 cm, likely developing pseudo cyst. Smaller adjacent 11 mm cystic area. Anterior to the pancreas is a solid-appearing mass measuring 5.6 x 4.6 cm. This was not present on prior study nor seen on prior  ultrasound. Bowel grossly unremarkable. Small amount of free fluid in the pelvis. No free air. No adenopathy. Urinary bladder is grossly unremarkable. Aorta is calcified, non aneurysmal. IMPRESSION: Changes of calcific pancreatitis. Inflammatory changes around the pancreas compatible with acute pancreatitis. 3.3 cm an 11 mm cystic areas adjacent to the pancreatic tail compatible with pseudocysts. 5.6 cm solid-appearing mass anterior to the pancreas. This conceivably could represent a hemorrhagic pseudo cyst, but a solid pancreatic mass or mass arising from adjacent structures cannot be completely excluded such as gist tumor. This should be further evaluated with MRI with and without contrast after acute symptoms abate and patient can tolerate the study. Hepatic cysts. Electronically Signed   By: KRolm BaptiseM.D.   On: 09/02/2015 11:28   Mr 3d Recon At Scanner  09/04/2015  ADDENDUM REPORT: 09/04/2015 13:39 ADDENDUM: In discussion with Dr. PHenrene Pastorconcern expressed for splenic vein thrombosis. The splenic vein does not fill with the IV contrast. The superior mesenteric vein is opacified with IV contrast. The proximal portal vein is slightly constricted and the more distal main and LEFT and RIGHT portal vein are patent. Additionally there are mild venous collaterals in the gastrohepatic ligament and again in the splenic hilum. 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 Findings conveyed toJohn PHenrene Pastor MHia[place]2/28/2017  at13:36. Electronically Signed   By: SSuzy BouchardM.D.   On: 09/04/2015 13:39  09/04/2015  CLINICAL DATA:  Pancreatic cysts. Alcoholic pancreatitis. Pancreatic peripancreatic mass lesion on CT. EXAM: MRI ABDOMEN WITHOUT AND WITH CONTRAST (INCLUDING MRCP) TECHNIQUE: Multiplanar multisequence MR imaging of the abdomen was performed both before and after the administration of intravenous contrast. Heavily T2-weighted  images of the biliary and pancreatic ducts were obtained, and three-dimensional MRCP images were rendered by post processing. CONTRAST:  166mMULTIHANCE GADOBENATE DIMEGLUMINE 529 MG/ML IV SOLN COMPARISON:  CT 09/02/2015, 04/25/2013 FINDINGS:  Lower chest:  Lung bases are clear. Hepatobiliary: Multiple nonenhancing cysts within the LEFT and RIGHT hepatic lobe. No focal enhancing hepatic lesion. No biliary duct dilatation. Gallbladder is normal. Pancreas: Pancreatic parenchyma is difficult define on the background of retroperitoneal and peritoneal inflammation. The pancreatic duct is chronically dilated. This ductal dilatation seen on image 35, series 1403). There is a communicating bilobed fluid collection ventral and dorsal to the pancreatic tail. The ventral portion is rounded measuring 5.0 by 4.0 cm corresponds to the questionable mass on CT. This lesion has some peripheral high signal intensity on T1 weighted imaging (image 30, series 1400) indicating blood product or proteinaceous products. No significant internal enhancement. This lesion communicates to a dorsal fluid collection measuring 3.6 by 2.1 cm which is more typical appearance of a pseudocyst. These bilobed cystic collections communicate through an isthmus on image 28, series 1402. There is a less well organized fluid collection in the LEFT adrenal region measuring 4.2 by 1.7 cm on image 37, series 1403). Spleen: Spleen is mildly enlarged. There venous collaterals in the splenic hilum. Adrenals/urinary tract: Adrenal glands and kidneys are normal. Stomach/Bowel: Stomach and limited of the small bowel is unremarkable Vascular/Lymphatic: Abdominal aortic normal caliber. No retroperitoneal periportal lymphadenopathy. Musculoskeletal: No aggressive osseous lesion IMPRESSION: 1. Lesion in question on comparison CT corresponds to complex nonenhancing mass most consistent with an hemorrhagic or infected pseudocyst. This complex lesion communicates with a more  typical pseudocyst ventral to the pancreatic tail. Recommend follow-up CT with contrast in to re-evaluate these complex cystic collections. 2. A second less well organized fluid collection in the LEFT super renal location. 3. Evidence of acute on chronic pancreatitis. 4. No evidence of biliary duct dilatation or enhancing liver lesion. Electronically Signed: By: Suzy Bouchard M.D. On: 09/04/2015 11:03   Dg Chest Port 1 View  09/02/2015  CLINICAL DATA:  Weakness.  Abdominal pain, rectal bleeding, nausea. EXAM: PORTABLE CHEST 1 VIEW COMPARISON:  07/25/2012 FINDINGS: The heart size and mediastinal contours are within normal limits. Both lungs are clear. The visualized skeletal structures are unremarkable. IMPRESSION: No active disease. Electronically Signed   By: Rolm Baptise M.D.   On: 09/02/2015 10:00   Mr Abd W/wo Cm/mrcp  09/04/2015  ADDENDUM REPORT: 09/04/2015 13:39 ADDENDUM: In discussion with Dr. 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 The splenic vein does not fill with the IV contrast. The superior mesenteric vein is opacified with IV contrast. The proximal portal vein is slightly constricted and the more distal main and LEFT and RIGHT portal vein are patent. Additionally there are mild venous collaterals in the gastrohepatic ligament and again in the splenic hilum. 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 Findings conveyed toJohn Henrene Pastor, [place] 09/04/2015  at13:36. Electronically Signed   By: Suzy Bouchard M.D.   On: 09/04/2015 13:39  09/04/2015  CLINICAL DATA:  Pancreatic cysts. Alcoholic pancreatitis. Pancreatic peripancreatic mass lesion on CT. EXAM: MRI ABDOMEN WITHOUT AND WITH CONTRAST (INCLUDING MRCP) TECHNIQUE: Multiplanar multisequence MR imaging of the abdomen was performed both before and after the administration of intravenous contrast. Heavily T2-weighted images of the biliary and pancreatic  ducts were obtained, and three-dimensional MRCP images were rendered by post processing. CONTRAST:  25m MULTIHANCE GADOBENATE DIMEGLUMINE 529 MG/ML IV SOLN COMPARISON:  CT 09/02/2015, 04/25/2013 FINDINGS: Lower chest:  Lung bases are clear. Hepatobiliary: Multiple nonenhancing cysts within the LEFT and RIGHT hepatic lobe. No focal enhancing hepatic lesion. No biliary  duct dilatation. Gallbladder is normal. Pancreas: Pancreatic parenchyma is difficult define on the background of retroperitoneal and peritoneal inflammation. The pancreatic duct is chronically dilated. This ductal dilatation seen on image 35, series 1403). There is a communicating bilobed fluid collection ventral and dorsal to the pancreatic tail. The ventral portion is rounded measuring 5.0 by 4.0 cm corresponds to the questionable mass on CT. This lesion has some peripheral high signal intensity on T1 weighted imaging (image 30, series 1400) indicating blood product or proteinaceous products. No significant internal enhancement. This lesion communicates to a dorsal fluid collection measuring 3.6 by 2.1 cm which is more typical appearance of a pseudocyst. These bilobed cystic collections communicate through an isthmus on image 28, series 1402. There is a less well organized fluid collection in the LEFT adrenal region measuring 4.2 by 1.7 cm on image 37, series 1403). Spleen: Spleen is mildly enlarged. There venous collaterals in the splenic hilum. Adrenals/urinary tract: Adrenal glands and kidneys are normal. Stomach/Bowel: Stomach and limited of the small bowel is unremarkable Vascular/Lymphatic: Abdominal aortic normal caliber. No retroperitoneal periportal lymphadenopathy. Musculoskeletal: No aggressive osseous lesion IMPRESSION: 1. Lesion in question on comparison CT corresponds to complex nonenhancing mass most consistent with an hemorrhagic or infected pseudocyst. This complex lesion communicates with a more typical pseudocyst ventral to the  pancreatic tail. Recommend follow-up CT with contrast in to re-evaluate these complex cystic collections. 2. A second less well organized fluid collection in the LEFT super renal location. 3. Evidence of acute on chronic pancreatitis. 4. No evidence of biliary duct dilatation or enhancing liver lesion. Electronically Signed: By: Suzy Bouchard M.D. On: 09/04/2015 11:03    Oren Binet, MD  [facility] Pager:[phone]  If 7PM-7AM, please contact night-coverage [email or web address] Password [email or web address] 09/06/2015, 2:35 PM   LOS: 4 days

## 2015-09-06 NOTE — Progress Notes (Signed)
Daily Rounding Note  09/06/2015, 8:32 AM  LOS: 4 days   SUBJECTIVE:       Last BM yesterday was not bloody.  Pt feels well, no complaints  OBJECTIVE:         Vital signs in last 24 hours:    Temp:  [98.2 F (36.8 C)-98.8 F (37.1 C)] 98.2 F (36.8 C) (03/02 0510) Pulse Rate:  [70-76] 70 (03/02 0510) Resp:  [18] 18 (03/02 0510) BP: (91-115)/(59-66) 91/59 mmHg (03/02 0510) SpO2:  [99 %-100 %] 99 % (03/02 0510) Last BM Date: 09/03/15 Filed Weights   09/02/15 1807  Weight: 61.1 kg (134 lb 11.2 oz)   General: looks malnourished but otherwise well.  Alert and comfortable   Heart: RRR Chest: clear bil.  No cough or dyspnea Abdomen: soft, NT, ND.  BS active  Extremities: no CCE Neuro/Psych:  Oriented x 3.  No limb weakness, no tremor.   Intake/Output from previous day: 03/01 0701 - 03/02 0700 In: 890 [P.O.:840; IV Piggyback:50] Out: 475 [Urine:475]  Intake/Output this shift:    Lab Results:  Recent Labs  09/05/15 0536 09/05/15 1558 09/06/15 0530  WBC 6.3 5.6 7.2  HGB 8.4* 7.9* 8.6*  HCT 25.7* 24.5* 25.4*  PLT 326 319 334   BMET  Recent Labs  09/04/15 0222 09/05/15 0536  NA 136 138  K 3.6 3.4*  CL 102 102  CO2 27 26  GLUCOSE 107* 104*  BUN <5* <5*  CREATININE 0.69 0.76  CALCIUM 8.2* 8.4*   LFT  Recent Labs  09/04/15 0222  PROT 4.5*  ALBUMIN 2.1*  AST 12*  ALT 9*  ALKPHOS 57  BILITOT 0.4   PT/INR No results for input(s): LABPROT, INR in the last 72 hours. Hepatitis Panel  Recent Labs  09/05/15 0536  HEPBSAG Negative    Studies/Results: Mr 3d Recon At Scanner  09/04/2015  ADDENDUM REPORT: 09/04/2015 13:39 ADDENDUM: In discussion with Dr. 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 The splenic vein does not fill with the IV contrast. The superior mesenteric vein is opacified with IV contrast. The proximal portal vein is slightly constricted and the more distal main and  LEFT and RIGHT portal vein are patent. Additionally there are mild venous collaterals in the gastrohepatic ligament and again in the splenic hilum. 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 Findings conveyed toJohn Henrene Pastor, [place] 09/04/2015  at13:36. Electronically Signed   By: Suzy Bouchard M.D.   On: 09/04/2015 13:39  09/04/2015  CLINICAL DATA:  Pancreatic cysts. Alcoholic pancreatitis. Pancreatic peripancreatic mass lesion on CT. EXAM: MRI ABDOMEN WITHOUT AND WITH CONTRAST (INCLUDING MRCP) TECHNIQUE: Multiplanar multisequence MR imaging of the abdomen was performed both before and after the administration of intravenous contrast. Heavily T2-weighted images of the biliary and pancreatic ducts were obtained, and three-dimensional MRCP images were rendered by post processing. CONTRAST:  33mL MULTIHANCE GADOBENATE DIMEGLUMINE 529 MG/ML IV SOLN COMPARISON:  CT 09/02/2015, 04/25/2013 FINDINGS: Lower chest:  Lung bases are clear. Hepatobiliary: Multiple nonenhancing cysts within the LEFT and RIGHT hepatic lobe. No focal enhancing hepatic lesion. No biliary duct dilatation. Gallbladder is normal. Pancreas: Pancreatic parenchyma is difficult define on the background of retroperitoneal and peritoneal inflammation. The pancreatic duct is chronically dilated. This ductal dilatation seen on image 35, series 1403). There is a communicating bilobed fluid collection ventral and dorsal to the pancreatic tail. The ventral portion is  rounded measuring 5.0 by 4.0 cm corresponds to the questionable mass on CT. This lesion has some peripheral high signal intensity on T1 weighted imaging (image 30, series 1400) indicating blood product or proteinaceous products. No significant internal enhancement. This lesion communicates to a dorsal fluid collection measuring 3.6 by 2.1 cm which is more typical appearance of a pseudocyst. These bilobed cystic  collections communicate through an isthmus on image 28, series 1402. There is a less well organized fluid collection in the LEFT adrenal region measuring 4.2 by 1.7 cm on image 37, series 1403). Spleen: Spleen is mildly enlarged. There venous collaterals in the splenic hilum. Adrenals/urinary tract: Adrenal glands and kidneys are normal. Stomach/Bowel: Stomach and limited of the small bowel is unremarkable Vascular/Lymphatic: Abdominal aortic normal caliber. No retroperitoneal periportal lymphadenopathy. Musculoskeletal: No aggressive osseous lesion IMPRESSION: 1. Lesion in question on comparison CT corresponds to complex nonenhancing mass most consistent with an hemorrhagic or infected pseudocyst. This complex lesion communicates with a more typical pseudocyst ventral to the pancreatic tail. Recommend follow-up CT with contrast in to re-evaluate these complex cystic collections. 2. A second less well organized fluid collection in the LEFT super renal location. 3. Evidence of acute on chronic pancreatitis. 4. No evidence of biliary duct dilatation or enhancing liver lesion. Electronically Signed: By: Suzy Bouchard M.D. On: 09/04/2015 11:03   Mr Jeananne Rama W/wo Cm/mrcp  09/04/2015  ADDENDUM REPORT: 09/04/2015 13:39 ADDENDUM: In discussion with Dr. 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 The splenic vein does not fill with the IV contrast. The superior mesenteric vein is opacified with IV contrast. The proximal portal vein is slightly constricted and the more distal main and LEFT and RIGHT portal vein are patent. Additionally there are mild venous collaterals in the gastrohepatic ligament and again in the splenic hilum. 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 Findings conveyed toJohn Henrene Pastor, [place] 09/04/2015  at13:36. Electronically Signed   By: Suzy Bouchard M.D.   On: 09/04/2015 13:39  09/04/2015  CLINICAL DATA:   Pancreatic cysts. Alcoholic pancreatitis. Pancreatic peripancreatic mass lesion on CT. EXAM: MRI ABDOMEN WITHOUT AND WITH CONTRAST (INCLUDING MRCP) TECHNIQUE: Multiplanar multisequence MR imaging of the abdomen was performed both before and after the administration of intravenous contrast. Heavily T2-weighted images of the biliary and pancreatic ducts were obtained, and three-dimensional MRCP images were rendered by post processing. CONTRAST:  2mL MULTIHANCE GADOBENATE DIMEGLUMINE 529 MG/ML IV SOLN COMPARISON:  CT 09/02/2015, 04/25/2013 FINDINGS: Lower chest:  Lung bases are clear. Hepatobiliary: Multiple nonenhancing cysts within the LEFT and RIGHT hepatic lobe. No focal enhancing hepatic lesion. No biliary duct dilatation. Gallbladder is normal. Pancreas: Pancreatic parenchyma is difficult define on the background of retroperitoneal and peritoneal inflammation. The pancreatic duct is chronically dilated. This ductal dilatation seen on image 35, series 1403). There is a communicating bilobed fluid collection ventral and dorsal to the pancreatic tail. The ventral portion is rounded measuring 5.0 by 4.0 cm corresponds to the questionable mass on CT. This lesion has some peripheral high signal intensity on T1 weighted imaging (image 30, series 1400) indicating blood product or proteinaceous products. No significant internal enhancement. This lesion communicates to a dorsal fluid collection measuring 3.6 by 2.1 cm which is more typical appearance of a pseudocyst. These bilobed cystic collections communicate through an isthmus on image 28, series 1402. There is a less well organized fluid collection in the LEFT adrenal region measuring 4.2 by 1.7 cm on  image 37, series 1403). Spleen: Spleen is mildly enlarged. There venous collaterals in the splenic hilum. Adrenals/urinary tract: Adrenal glands and kidneys are normal. Stomach/Bowel: Stomach and limited of the small bowel is unremarkable Vascular/Lymphatic: Abdominal  aortic normal caliber. No retroperitoneal periportal lymphadenopathy. Musculoskeletal: No aggressive osseous lesion IMPRESSION: 1. Lesion in question on comparison CT corresponds to complex nonenhancing mass most consistent with an hemorrhagic or infected pseudocyst. This complex lesion communicates with a more typical pseudocyst ventral to the pancreatic tail. Recommend follow-up CT with contrast in to re-evaluate these complex cystic collections. 2. A second less well organized fluid collection in the LEFT super renal location. 3. Evidence of acute on chronic pancreatitis. 4. No evidence of biliary duct dilatation or enhancing liver lesion. Electronically Signed: By: Suzy Bouchard M.D. On: 09/04/2015 11:03    ASSESMENT:   * Bleeding PR. Dark stool with red blood. 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 normal ampulla. No active bleeding. Octreotide stopped 2/27 after 2 days. On daily oral PPI.  Plan 09/07/15 colonoscopy to complete endoscopic workup.   * Chronic pancreatitis. 06/2015 acute, recurrent ETOH pancreatitis and pseudocyst. Calcification c/w chronic pancreatitis. Solid appearing pancreatic mass on 2/26 CT; by 2/28 MRI/MRCP: hemorrhagic vs infected pseudocyst. Pt has no fever or abd pain.   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  Surgery consult re: splenectomy. They rec avoiding splenectomy in a cirrhotic Appreciate ID guidance re: pre splenectomy vaccinations in case he does go to splenectomy.   * Cirrhosis. Hx ascites: 4 liter paracentesis 05/2012, no SBP. Platelets normal.Coags: normal INR, PT 17.3.  Hep B and C serologies negative.   * Alcoholism. Not in remission as of day of admission.   * Anemia, normocytic. S/p PRBC x 2. Hgb stable. 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  Hgb 06/2015: 13.0. Iron and iron sat low, ferritin 179. B 12 low.   * DM. Glucose to 244.   * SBO 11/2005.   * Hypokalemia.  Persists.    PLAN   *  Colonoscopy tomorrow.  Split dose moviprep starts tonight.   *  Initiated B12 injections per rec of up to date (106mcg daily for 7 days to start)       Azucena Freed  09/06/2015, 8:32 AM Pager: [phone]  GI ATTENDING  Interval history data reviewed. Patient personally seen and examined. Agree with comprehensive progress note as outlined above. Multiple problems as outlined. Radiology addendum noted regarding splenic vein thrombosis and gastric varices. Initiate replacement therapy for B12 deficiency. Plan colonoscopy tomorrow to rule out lower GI lesions for red blood in stool. The patient is high risk.  Docia Chuck. Geri [place]in[place]., M.D. [facility] of Gastroenterology

## 2015-09-07 ENCOUNTER — Inpatient Hospital Stay (HOSPITAL_COMMUNITY): Payer: Medicaid Other | Admitting: Certified Registered Nurse Anesthetist

## 2015-09-07 ENCOUNTER — Encounter: Payer: Self-pay | Admitting: Gastroenterology

## 2015-09-07 ENCOUNTER — Encounter (HOSPITAL_COMMUNITY): Payer: Self-pay | Admitting: *Deleted

## 2015-09-07 ENCOUNTER — Encounter (HOSPITAL_COMMUNITY)
Admission: EM | Disposition: A | Discharge status: Home / Self Care | Payer: Self-pay | Source: Home / Self Care | Attending: Internal Medicine

## 2015-09-07 DIAGNOSIS — E43 Unspecified severe protein-calorie malnutrition: Secondary | ICD-10-CM

## 2015-09-07 DIAGNOSIS — K635 Polyp of colon: Secondary | ICD-10-CM

## 2015-09-07 DIAGNOSIS — D122 Benign neoplasm of ascending colon: Secondary | ICD-10-CM

## 2015-09-07 HISTORY — PX: COLONOSCOPY: SHX5424

## 2015-09-07 HISTORY — DX: Polyp of colon: K63.5

## 2015-09-07 SURGERY — COLONOSCOPY
Anesthesia: Monitor Anesthesia Care

## 2015-09-07 MED ORDER — LACTATED RINGERS IV SOLN
INTRAVENOUS | Status: DC
  Administered 2015-09-07 (×2): via INTRAVENOUS

## 2015-09-07 MED ORDER — FOLIC ACID 1 MG PO TABS: 1.0000 mg | ORAL_TABLET | Freq: Every day | ORAL | Status: DC

## 2015-09-07 MED ORDER — ADULT MULTIVITAMIN W/MINERALS CH: 1.0000 | ORAL_TABLET | Freq: Every day | ORAL | Status: DC

## 2015-09-07 MED ORDER — PANTOPRAZOLE SODIUM 40 MG PO TBEC: 40.0000 mg | DELAYED_RELEASE_TABLET | Freq: Every day | ORAL | Status: DC

## 2015-09-07 MED ORDER — PROPOFOL 500 MG/50ML IV EMUL
INTRAVENOUS | Status: DC | PRN
  Administered 2015-09-07: 100 ug/kg/min via INTRAVENOUS

## 2015-09-07 MED ORDER — SODIUM CHLORIDE 0.9 % IV SOLN: INTRAVENOUS | Status: DC

## 2015-09-07 MED ORDER — BOOST / RESOURCE BREEZE PO LIQD: 1.0000 | Freq: Three times a day (TID) | ORAL | Status: DC

## 2015-09-07 MED ORDER — THIAMINE HCL 100 MG PO TABS: 100.0000 mg | ORAL_TABLET | Freq: Every day | ORAL | Status: DC

## 2015-09-07 MED ORDER — PHENYLEPHRINE HCL 10 MG/ML IJ SOLN
INTRAMUSCULAR | Status: DC | PRN
  Administered 2015-09-07: 100 ug via INTRAVENOUS

## 2015-09-07 NOTE — Progress Notes (Signed)
Discussed discharge summary with patient. Reviewed all medications with patient. Patient received Rx. Patient ready for discharge. 

## 2015-09-07 NOTE — Op Note (Signed)
[facility] [place], [address]   COLONOSCOPY PROCEDURE REPORT  PATIENT: Alexander Schmitt, Alexander Schmitt  MR#: [id number] BIRTHDATE: [birth year] , 64  yrs. old GENDER: male ENDOSCOPIST: Eustace Quail, MD REFERRED [id number] Hospitalists PROCEDURE DATE:  09/07/2015 PROCEDURE:   Colonoscopy with snare polypectomy x 1  ASA CLASS:   Class III INDICATIONS:melena and hematochezia. MEDICATIONS: Monitored anesthesia care and Per Anesthesia  DESCRIPTION OF PROCEDURE:   After the risks benefits and alternatives of the procedure were thoroughly explained, informed consent was obtained.  The digital rectal exam revealed no abnormalities of the rectum.   The [employer] Adult Colon [id number] endoscope was introduced through the anus and advanced to the cecum, which was identified by both the appendix and ileocecal valve. No adverse events experienced.   The quality of the prep was excellent.  (MoviPrep was used)  The instrument was then slowly withdrawn as the colon was fully examined. Estimated blood loss is zero unless otherwise noted in this procedure report.   COLON FINDINGS: A pedunculated polyp measuring 5 mm in size was found in the ascending colon.  A polypectomy was performed with a cold snare.  The resection was complete, the polyp tissue was completely retrieved and sent to histology.   There was diverticulosis as manifested by a few tiny scattered diverticula. The examination was otherwise normal.  Retroflexed views revealed internal hemorrhoids. The time to cecum = 4.8 Withdrawal time = 9.9 The scope was withdrawn and the procedure completed. COMPLICATIONS: There were no immediate complications.  ENDOSCOPIC IMPRESSION: 1.   Pedunculated polyp was found in the ascending colon; polypectomy was performed with a cold snare 2.   A few tiny scattered diverticula 3.   The examination was otherwise normal  RECOMMENDATIONS: 1. Repeat colonoscopy in 5 years if  polyp adenomatous; otherwise 10 years with Dr. Havery Moros. Will send results letter 2. Advance diet 3. Okay for discharge in morning 4. Do not drink any alcohol again forever 5. Outpatient GI follow-up with Dr. Nicki Guadalajara. Appointment has been made  Will sign off. Reviewed with patient. Patient given copy of this report  eSigned:  Eustace Quail, MD 09/07/2015 12:08 PM   cc: The Patient

## 2015-09-07 NOTE — Anesthesia Postprocedure Evaluation (Signed)
Anesthesia Post Note  Patient: Alexander Schmitt  Procedure(s) Performed: Procedure(s) (LRB): COLONOSCOPY (N/A)  Patient location during evaluation: Endoscopy Anesthesia Type: MAC Level of consciousness: awake Pain management: pain level controlled Vital Signs Assessment: post-procedure vital signs reviewed and stable Respiratory status: spontaneous breathing Cardiovascular status: stable Anesthetic complications: no    Last Vitals:  Filed Vitals:   09/07/15 1210 09/07/15 1220  BP: 107/77 121/77  Pulse: 81 68  Temp:    Resp: 23 18    Last Pain:  Filed Vitals:   09/07/15 1221  PainSc: 0-No pain                 EDWARDS,Kimberley Speece

## 2015-09-07 NOTE — H&P (View-Only) (Signed)
Daily Rounding Note  09/06/2015, 8:32 AM  LOS: 4 days   SUBJECTIVE:       Last BM yesterday was not bloody.  Pt feels well, no complaints  OBJECTIVE:         Vital signs in last 24 hours:    Temp:  [98.2 F (36.8 C)-98.8 F (37.1 C)] 98.2 F (36.8 C) (03/02 0510) Pulse Rate:  [70-76] 70 (03/02 0510) Resp:  [18] 18 (03/02 0510) BP: (91-115)/(59-66) 91/59 mmHg (03/02 0510) SpO2:  [99 %-100 %] 99 % (03/02 0510) Last BM Date: 09/03/15 Filed Weights   09/02/15 1807  Weight: 61.1 kg (134 lb 11.2 oz)   General: looks malnourished but otherwise well.  Alert and comfortable   Heart: RRR Chest: clear bil.  No cough or dyspnea Abdomen: soft, NT, ND.  BS active  Extremities: no CCE Neuro/Psych:  Oriented x 3.  No limb weakness, no tremor.   Intake/Output from previous day: 03/01 0701 - 03/02 0700 In: 890 [P.O.:840; IV Piggyback:50] Out: 475 [Urine:475]  Intake/Output this shift:    Lab Results:  Recent Labs  09/05/15 0536 09/05/15 1558 09/06/15 0530  WBC 6.3 5.6 7.2  HGB 8.4* 7.9* 8.6*  HCT 25.7* 24.5* 25.4*  PLT 326 319 334   BMET  Recent Labs  09/04/15 0222 09/05/15 0536  NA 136 138  K 3.6 3.4*  CL 102 102  CO2 27 26  GLUCOSE 107* 104*  BUN <5* <5*  CREATININE 0.69 0.76  CALCIUM 8.2* 8.4*   LFT  Recent Labs  09/04/15 0222  PROT 4.5*  ALBUMIN 2.1*  AST 12*  ALT 9*  ALKPHOS 57  BILITOT 0.4   PT/INR No results for input(s): LABPROT, INR in the last 72 hours. Hepatitis Panel  Recent Labs  09/05/15 0536  HEPBSAG Negative    Studies/Results: Mr 3d Recon At Scanner  09/04/2015  ADDENDUM REPORT: 09/04/2015 13:39 ADDENDUM: In discussion with Dr. 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 The splenic vein does not fill with the IV contrast. The superior mesenteric vein is opacified with IV contrast. The proximal portal vein is slightly constricted and the more distal main and  LEFT and RIGHT portal vein are patent. Additionally there are mild venous collaterals in the gastrohepatic ligament and again in the splenic hilum. 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 Findings conveyed toJohn Henrene Pastor, [place] 09/04/2015  at13:36. Electronically Signed   By: Suzy Bouchard M.D.   On: 09/04/2015 13:39  09/04/2015  CLINICAL DATA:  Pancreatic cysts. Alcoholic pancreatitis. Pancreatic peripancreatic mass lesion on CT. EXAM: MRI ABDOMEN WITHOUT AND WITH CONTRAST (INCLUDING MRCP) TECHNIQUE: Multiplanar multisequence MR imaging of the abdomen was performed both before and after the administration of intravenous contrast. Heavily T2-weighted images of the biliary and pancreatic ducts were obtained, and three-dimensional MRCP images were rendered by post processing. CONTRAST:  33mL MULTIHANCE GADOBENATE DIMEGLUMINE 529 MG/ML IV SOLN COMPARISON:  CT 09/02/2015, 04/25/2013 FINDINGS: Lower chest:  Lung bases are clear. Hepatobiliary: Multiple nonenhancing cysts within the LEFT and RIGHT hepatic lobe. No focal enhancing hepatic lesion. No biliary duct dilatation. Gallbladder is normal. Pancreas: Pancreatic parenchyma is difficult define on the background of retroperitoneal and peritoneal inflammation. The pancreatic duct is chronically dilated. This ductal dilatation seen on image 35, series 1403). There is a communicating bilobed fluid collection ventral and dorsal to the pancreatic tail. The ventral portion is  rounded measuring 5.0 by 4.0 cm corresponds to the questionable mass on CT. This lesion has some peripheral high signal intensity on T1 weighted imaging (image 30, series 1400) indicating blood product or proteinaceous products. No significant internal enhancement. This lesion communicates to a dorsal fluid collection measuring 3.6 by 2.1 cm which is more typical appearance of a pseudocyst. These bilobed cystic  collections communicate through an isthmus on image 28, series 1402. There is a less well organized fluid collection in the LEFT adrenal region measuring 4.2 by 1.7 cm on image 37, series 1403). Spleen: Spleen is mildly enlarged. There venous collaterals in the splenic hilum. Adrenals/urinary tract: Adrenal glands and kidneys are normal. Stomach/Bowel: Stomach and limited of the small bowel is unremarkable Vascular/Lymphatic: Abdominal aortic normal caliber. No retroperitoneal periportal lymphadenopathy. Musculoskeletal: No aggressive osseous lesion IMPRESSION: 1. Lesion in question on comparison CT corresponds to complex nonenhancing mass most consistent with an hemorrhagic or infected pseudocyst. This complex lesion communicates with a more typical pseudocyst ventral to the pancreatic tail. Recommend follow-up CT with contrast in to re-evaluate these complex cystic collections. 2. A second less well organized fluid collection in the LEFT super renal location. 3. Evidence of acute on chronic pancreatitis. 4. No evidence of biliary duct dilatation or enhancing liver lesion. Electronically Signed: By: Suzy Bouchard M.D. On: 09/04/2015 11:03   Mr Jeananne Rama W/wo Cm/mrcp  09/04/2015  ADDENDUM REPORT: 09/04/2015 13:39 ADDENDUM: In discussion with Dr. 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 The splenic vein does not fill with the IV contrast. The superior mesenteric vein is opacified with IV contrast. The proximal portal vein is slightly constricted and the more distal main and LEFT and RIGHT portal vein are patent. Additionally there are mild venous collaterals in the gastrohepatic ligament and again in the splenic hilum. 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 Findings conveyed toJohn Henrene Pastor, [place] 09/04/2015  at13:36. Electronically Signed   By: Suzy Bouchard M.D.   On: 09/04/2015 13:39  09/04/2015  CLINICAL DATA:   Pancreatic cysts. Alcoholic pancreatitis. Pancreatic peripancreatic mass lesion on CT. EXAM: MRI ABDOMEN WITHOUT AND WITH CONTRAST (INCLUDING MRCP) TECHNIQUE: Multiplanar multisequence MR imaging of the abdomen was performed both before and after the administration of intravenous contrast. Heavily T2-weighted images of the biliary and pancreatic ducts were obtained, and three-dimensional MRCP images were rendered by post processing. CONTRAST:  17mL MULTIHANCE GADOBENATE DIMEGLUMINE 529 MG/ML IV SOLN COMPARISON:  CT 09/02/2015, 04/25/2013 FINDINGS: Lower chest:  Lung bases are clear. Hepatobiliary: Multiple nonenhancing cysts within the LEFT and RIGHT hepatic lobe. No focal enhancing hepatic lesion. No biliary duct dilatation. Gallbladder is normal. Pancreas: Pancreatic parenchyma is difficult define on the background of retroperitoneal and peritoneal inflammation. The pancreatic duct is chronically dilated. This ductal dilatation seen on image 35, series 1403). There is a communicating bilobed fluid collection ventral and dorsal to the pancreatic tail. The ventral portion is rounded measuring 5.0 by 4.0 cm corresponds to the questionable mass on CT. This lesion has some peripheral high signal intensity on T1 weighted imaging (image 30, series 1400) indicating blood product or proteinaceous products. No significant internal enhancement. This lesion communicates to a dorsal fluid collection measuring 3.6 by 2.1 cm which is more typical appearance of a pseudocyst. These bilobed cystic collections communicate through an isthmus on image 28, series 1402. There is a less well organized fluid collection in the LEFT adrenal region measuring 4.2 by 1.7 cm on  image 37, series 1403). Spleen: Spleen is mildly enlarged. There venous collaterals in the splenic hilum. Adrenals/urinary tract: Adrenal glands and kidneys are normal. Stomach/Bowel: Stomach and limited of the small bowel is unremarkable Vascular/Lymphatic: Abdominal  aortic normal caliber. No retroperitoneal periportal lymphadenopathy. Musculoskeletal: No aggressive osseous lesion IMPRESSION: 1. Lesion in question on comparison CT corresponds to complex nonenhancing mass most consistent with an hemorrhagic or infected pseudocyst. This complex lesion communicates with a more typical pseudocyst ventral to the pancreatic tail. Recommend follow-up CT with contrast in to re-evaluate these complex cystic collections. 2. A second less well organized fluid collection in the LEFT super renal location. 3. Evidence of acute on chronic pancreatitis. 4. No evidence of biliary duct dilatation or enhancing liver lesion. Electronically Signed: By: Suzy Bouchard M.D. On: 09/04/2015 11:03    ASSESMENT:   * Bleeding PR. Dark stool with red blood. 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 normal ampulla. No active bleeding. Octreotide stopped 2/27 after 2 days. On daily oral PPI.  Plan 09/07/15 colonoscopy to complete endoscopic workup.   * Chronic pancreatitis. 06/2015 acute, recurrent ETOH pancreatitis and pseudocyst. Calcification c/w chronic pancreatitis. Solid appearing pancreatic mass on 2/26 CT; by 2/28 MRI/MRCP: hemorrhagic vs infected pseudocyst. Pt has no fever or abd pain.   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  Surgery consult re: splenectomy. They rec avoiding splenectomy in a cirrhotic Appreciate ID guidance re: pre splenectomy vaccinations in case he does go to splenectomy.   * Cirrhosis. Hx ascites: 4 liter paracentesis 05/2012, no SBP. Platelets normal.Coags: normal INR, PT 17.3.  Hep B and C serologies negative.   * Alcoholism. Not in remission as of day of admission.   * Anemia, normocytic. S/p PRBC x 2. Hgb stable. 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  Hgb 06/2015: 13.0. Iron and iron sat low, ferritin 179. B 12 low.   * DM. Glucose to 244.   * SBO 11/2005.   * Hypokalemia.  Persists.    PLAN   *  Colonoscopy tomorrow.  Split dose moviprep starts tonight.   *  Initiated B12 injections per rec of up to date (1032mcg daily for 7 days to start)       Azucena Freed  09/06/2015, 8:32 AM Pager: [phone]  GI ATTENDING  Interval history data reviewed. Patient personally seen and examined. Agree with comprehensive progress note as outlined above. Multiple problems as outlined. Radiology addendum noted regarding splenic vein thrombosis and gastric varices. Initiate replacement therapy for B12 deficiency. Plan colonoscopy tomorrow to rule out lower GI lesions for red blood in stool. The patient is high risk.  Docia Chuck. Geri [place]in[place]., M.D. [facility] Division of Gastroenterology

## 2015-09-07 NOTE — Anesthesia Preprocedure Evaluation (Addendum)
Anesthesia Evaluation  Patient identified by MRN, date of birth, ID band Patient awake    Reviewed: Allergy & Precautions, NPO status , Patient's Chart, lab work & pertinent test results, Unable to perform ROS - Chart review only  Airway Mallampati: II  TM Distance: >3 FB Neck ROM: Full    Dental   Pulmonary Current Smoker,    breath sounds clear to auscultation       Cardiovascular negative cardio ROS   Rhythm:Regular Rate:Normal     Neuro/Psych    GI/Hepatic Neg liver ROS, GERD  ,  Endo/Other  diabetes  Renal/GU negative Renal ROS     Musculoskeletal   Abdominal   Peds  Hematology  (+) anemia ,   Anesthesia Other Findings   Reproductive/Obstetrics                            Anesthesia Physical Anesthesia Plan  ASA: III  Anesthesia Plan: MAC   Post-op Pain Management:    Induction: Intravenous  Airway Management Planned: Simple Face Mask  Additional Equipment:   Intra-op Plan:   Post-operative Plan:   Informed Consent: I have reviewed the patients History and Physical, chart, labs and discussed the procedure including the risks, benefits and alternatives for the proposed anesthesia with the patient or authorized representative who has indicated his/her understanding and acceptance.   Dental advisory given  Plan Discussed with: Anesthesiologist, Surgeon and CRNA  Anesthesia Plan Comments:        Anesthesia Quick Evaluation

## 2015-09-07 NOTE — Discharge Summary (Signed)
PATIENT DETAILS Name: Alexander Schmitt Age: 65 y.o. Sex: male Date of Birth: [birth year] MRN: [id number]. Admitting Physician: Reyne Dumas, MD [id number], Sharyn Lull, NP  Admit Date: 09/02/2015 Discharge date: 09/07/2015  Recommendations for Outpatient Follow-up:  1. Has splenic vein thrombosis, pancreatic pseudocysts-will require follow-up/evaluation by GI M.D. on follow-up visit.  2. Repeat CBC at next visit 3. Follow biopsy results-underwent polypectomy-during colonoscopy on 3/3.  PRIMARY DISCHARGE DIAGNOSIS:  Principal Problem:   UGI bleed Active Problems:   Cirrhosis ([place])   Alcoholic pancreatitis   Protein-calorie malnutrition, severe (HCC)   Abdominal pain in male   Alcohol abuse   Abnormal CT of the abdomen   Gastric varices   Pancreatic mass   Acute blood loss anemia   Abdominal discomfort   Melena   Splenic vein thrombosis   Asplenia   Benign neoplasm of ascending colon      PAST MEDICAL HISTORY: Past Medical History  Diagnosis Date  . SBO (small bowel obstruction) ([place]) 11/2005    medical mgt  . History of stomach ulcers   . Pancreatitis [id number]    acute alcoholic and chronic; pseudocyst and pancreatic calcifications 11/2011  . GERD (gastroesophageal reflux disease)     occasional   . Arthritis     left shoulder   . Cirrhosis ([place]) 2013  . Effusion of right knee 03/2014  . Ascites 11/2005, 05/2012    05/2012:4 liter paracentesis, no SBP   . Diabetes mellitus ([place]) 2017    08/2015 sugars into 200s but hyperglycemia dates to at least 2013.   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 Upper GI bleed 08/2015    suspected gastric varices; ectopic mucosa of doudenal bulb: irregular duodenal mucosa vs normal ampula  . Anemia 08/2015    PRBC x 2 units 08/2015.   . Colon polyp 09/07/15    ascenging colon, minor/scattered diverticulosis on colonoscopy  . Alcoholism ([place]) since before 2013    DISCHARGE MEDICATIONS: Current Discharge Medication List    START taking these  medications   Details  feeding supplement (BOOST / RESOURCE BREEZE) LIQD Take 1 Container by mouth 3 (three) times daily between meals. Qty: 90 Container, Refills: 0    folic acid (FOLVITE) 1 MG tablet Take 1 tablet (1 mg total) by mouth daily. Qty: 30 tablet, Refills: 0    Multiple Vitamin (MULTIVITAMIN WITH MINERALS) TABS tablet Take 1 tablet by mouth daily. Qty: 30 tablet, Refills: 0    pantoprazole (PROTONIX) 40 MG tablet Take 1 tablet (40 mg total) by mouth daily at 6 (six) AM. Qty: 30 tablet, Refills: 0    thiamine 100 MG tablet Take 1 tablet (100 mg total) by mouth daily. Qty: 30 tablet, Refills: 0      CONTINUE these medications which have NOT CHANGED   Details  acetaminophen (TYLENOL) 325 MG tablet Take 650 mg by mouth every 6 (six) hours as needed (pain).      STOP taking these medications     bismuth subsalicylate (PEPTO BISMOL) 262 MG/15ML suspension      ibuprofen (ADVIL,MOTRIN) 200 MG tablet      magnesium hydroxide (MILK OF MAGNESIA) 400 MG/5ML suspension         ALLERGIES:  No Known Allergies  BRIEF HPI:  See H&P, Labs, Consult and Test reports for all details in brief, patient was admitted for ventilation of hematochezia.  CONSULTATIONS:   ID, GI and general surgery  PERTINENT RADIOLOGIC STUDIES: Ct Abdomen Pelvis W Contrast  09/02/2015  CLINICAL  DATA:  Abdominal pain, discomfort for 2-3 days EXAM: CT ABDOMEN AND PELVIS WITH CONTRAST TECHNIQUE: Multidetector CT imaging of the abdomen and pelvis was performed using the standard protocol following bolus administration of intravenous contrast. CONTRAST:  134mL OMNIPAQUE IOHEXOL 300 MG/ML  SOLN COMPARISON:  Ultrasound 01/02/2015.  CT 05/03/2013. FINDINGS: Lung bases are clear. No effusions. Heart is normal size. Coronary artery calcifications. Small hypodensities scattered throughout the liver, similar prior CT compatible with cysts. Spleen, adrenals are unremarkable. Small benign cyst in the lower pole of  the right kidney. No hydronephrosis. Diffuse calcifications throughout the pancreas as seen on prior CT compatible with chronic calcific pancreatitis. In addition, there are inflammatory changes around the pancreas and in the left anterior para renal space compatible with acute pancreatitis. Fluid collection posterior to the pancreatic tail in the left upper quadrant measures up to 3.3 cm, likely developing pseudo cyst. Smaller adjacent 11 mm cystic area. Anterior to the pancreas is a solid-appearing mass measuring 5.6 x 4.6 cm. This was not present on prior study nor seen on prior ultrasound. Bowel grossly unremarkable. Small amount of free fluid in the pelvis. No free air. No adenopathy. Urinary bladder is grossly unremarkable. Aorta is calcified, non aneurysmal. IMPRESSION: Changes of calcific pancreatitis. Inflammatory changes around the pancreas compatible with acute pancreatitis. 3.3 cm an 11 mm cystic areas adjacent to the pancreatic tail compatible with pseudocysts. 5.6 cm solid-appearing mass anterior to the pancreas. This conceivably could represent a hemorrhagic pseudo cyst, but a solid pancreatic mass or mass arising from adjacent structures cannot be completely excluded such as gist tumor. This should be further evaluated with MRI with and without contrast after acute symptoms abate and patient can tolerate the study. Hepatic cysts. Electronically Signed   By: Rolm Baptise M.D.   On: 09/02/2015 11:28   Mr 3d Recon At Scanner  09/04/2015  ADDENDUM REPORT: 09/04/2015 13:39 ADDENDUM: In discussion with Dr. 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 The splenic vein does not fill with the IV contrast. The superior mesenteric vein is opacified with IV contrast. The proximal portal vein is slightly constricted and the more distal main and LEFT and RIGHT portal vein are patent. Additionally there are mild venous collaterals in the gastrohepatic ligament and again in the splenic hilum. Additional  Impression: - occlusion of the splenic vein - perigastric venous collaterals along the lesser curvature of stomach. Findings conveyed toJohn Henrene Pastor, [place] 09/04/2015  at13:36. Electronically Signed   By: Suzy Bouchard M.D.   On: 09/04/2015 13:39  09/04/2015  CLINICAL DATA:  Pancreatic cysts. Alcoholic pancreatitis. Pancreatic peripancreatic mass lesion on CT. EXAM: MRI ABDOMEN WITHOUT AND WITH CONTRAST (INCLUDING MRCP) TECHNIQUE: Multiplanar multisequence MR imaging of the abdomen was performed both before and after the administration of intravenous contrast. Heavily T2-weighted images of the biliary and pancreatic ducts were obtained, and three-dimensional MRCP images were rendered by post processing. CONTRAST:  40mL MULTIHANCE GADOBENATE DIMEGLUMINE 529 MG/ML IV SOLN COMPARISON:  CT 09/02/2015, 04/25/2013 FINDINGS: Lower chest:  Lung bases are clear. Hepatobiliary: Multiple nonenhancing cysts within the LEFT and RIGHT hepatic lobe. No focal enhancing hepatic lesion. No biliary duct dilatation. Gallbladder is normal. Pancreas: Pancreatic parenchyma is difficult define on the background of retroperitoneal and peritoneal inflammation. The pancreatic duct is chronically dilated. This ductal dilatation seen on image 35, series 1403). There is a communicating bilobed fluid collection ventral and dorsal to the pancreatic tail. The ventral portion is rounded measuring 5.0 by 4.0 cm corresponds to the questionable  mass on CT. This lesion has some peripheral high signal intensity on T1 weighted imaging (image 30, series 1400) indicating blood product or proteinaceous products. No significant internal enhancement. This lesion communicates to a dorsal fluid collection measuring 3.6 by 2.1 cm which is more typical appearance of a pseudocyst. These bilobed cystic collections communicate through an isthmus on image 28, series 1402. There is a less well organized fluid collection in the LEFT adrenal region measuring 4.2 by 1.7  cm on image 37, series 1403). Spleen: Spleen is mildly enlarged. There venous collaterals in the splenic hilum. Adrenals/urinary tract: Adrenal glands and kidneys are normal. Stomach/Bowel: Stomach and limited of the small bowel is unremarkable Vascular/Lymphatic: Abdominal aortic normal caliber. No retroperitoneal periportal lymphadenopathy. Musculoskeletal: No aggressive osseous lesion IMPRESSION: 1. Lesion in question on comparison CT corresponds to complex nonenhancing mass most consistent with an hemorrhagic or infected pseudocyst. This complex lesion communicates with a more typical pseudocyst ventral to the pancreatic tail. Recommend follow-up CT with contrast in to re-evaluate these complex cystic collections. 2. A second less well organized fluid collection in the LEFT super renal location. 3. Evidence of acute on chronic pancreatitis. 4. No evidence of biliary duct dilatation or enhancing liver lesion. Electronically Signed: By: Suzy Bouchard M.D. On: 09/04/2015 11:03   Dg Chest Port 1 View  09/02/2015  CLINICAL DATA:  Weakness.  Abdominal pain, rectal bleeding, nausea. EXAM: PORTABLE CHEST 1 VIEW COMPARISON:  07/25/2012 FINDINGS: The heart size and mediastinal contours are within normal limits. Both lungs are clear. The visualized skeletal structures are unremarkable. IMPRESSION: No active disease. Electronically Signed   By: Rolm Baptise M.D.   On: 09/02/2015 10:00   Mr Abd W/wo Cm/mrcp  09/04/2015  ADDENDUM REPORT: 09/04/2015 13:39 ADDENDUM: In discussion with Dr. 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 The splenic vein does not fill with the IV contrast. The superior mesenteric vein is opacified with IV contrast. The proximal portal vein is slightly constricted and the more distal main and LEFT and RIGHT portal vein are patent. Additionally there are mild venous collaterals in the gastrohepatic ligament and again in the splenic hilum. Additional Impression: - occlusion of the  splenic vein - perigastric venous collaterals along the lesser curvature of stomach. Findings conveyed toJohn Henrene Pastor, [place] 09/04/2015  at13:36. Electronically Signed   By: Suzy Bouchard M.D.   On: 09/04/2015 13:39  09/04/2015  CLINICAL DATA:  Pancreatic cysts. Alcoholic pancreatitis. Pancreatic peripancreatic mass lesion on CT. EXAM: MRI ABDOMEN WITHOUT AND WITH CONTRAST (INCLUDING MRCP) TECHNIQUE: Multiplanar multisequence MR imaging of the abdomen was performed both before and after the administration of intravenous contrast. Heavily T2-weighted images of the biliary and pancreatic ducts were obtained, and three-dimensional MRCP images were rendered by post processing. CONTRAST:  30mL MULTIHANCE GADOBENATE DIMEGLUMINE 529 MG/ML IV SOLN COMPARISON:  CT 09/02/2015, 04/25/2013 FINDINGS: Lower chest:  Lung bases are clear. Hepatobiliary: Multiple nonenhancing cysts within the LEFT and RIGHT hepatic lobe. No focal enhancing hepatic lesion. No biliary duct dilatation. Gallbladder is normal. Pancreas: Pancreatic parenchyma is difficult define on the background of retroperitoneal and peritoneal inflammation. The pancreatic duct is chronically dilated. This ductal dilatation seen on image 35, series 1403). There is a communicating bilobed fluid collection ventral and dorsal to the pancreatic tail. The ventral portion is rounded measuring 5.0 by 4.0 cm corresponds to the questionable mass on CT. This lesion has some peripheral high signal intensity on T1 weighted imaging (image 30, series 1400) indicating blood product or proteinaceous products.  No significant internal enhancement. This lesion communicates to a dorsal fluid collection measuring 3.6 by 2.1 cm which is more typical appearance of a pseudocyst. These bilobed cystic collections communicate through an isthmus on image 28, series 1402. There is a less well organized fluid collection in the LEFT adrenal region measuring 4.2 by 1.7 cm on image 37, series 1403).  Spleen: Spleen is mildly enlarged. There venous collaterals in the splenic hilum. Adrenals/urinary tract: Adrenal glands and kidneys are normal. Stomach/Bowel: Stomach and limited of the small bowel is unremarkable Vascular/Lymphatic: Abdominal aortic normal caliber. No retroperitoneal periportal lymphadenopathy. Musculoskeletal: No aggressive osseous lesion IMPRESSION: 1. Lesion in question on comparison CT corresponds to complex nonenhancing mass most consistent with an hemorrhagic or infected pseudocyst. This complex lesion communicates with a more typical pseudocyst ventral to the pancreatic tail. Recommend follow-up CT with contrast in to re-evaluate these complex cystic collections. 2. A second less well organized fluid collection in the LEFT super renal location. 3. Evidence of acute on chronic pancreatitis. 4. No evidence of biliary duct dilatation or enhancing liver lesion. Electronically Signed: By: Suzy Bouchard M.D. On: 09/04/2015 11:03     PERTINENT LAB RESULTS: CBC:  Recent Labs  09/05/15 1558 09/06/15 0530  WBC 5.6 7.2  HGB 7.9* 8.6*  HCT 24.5* 25.4*  PLT 319 334   CMET CMP     Component Value Date/Time   NA 138 09/05/2015 0536   K 3.4* 09/05/2015 0536   CL 102 09/05/2015 0536   CO2 26 09/05/2015 0536   GLUCOSE 104* 09/05/2015 0536   BUN <5* 09/05/2015 0536   CREATININE 0.76 09/05/2015 0536   CALCIUM 8.4* 09/05/2015 0536   PROT 4.5* 09/04/2015 0222   ALBUMIN 2.1* 09/04/2015 0222   AST 12* 09/04/2015 0222   ALT 9* 09/04/2015 0222   ALKPHOS 57 09/04/2015 0222   BILITOT 0.4 09/04/2015 0222   GFRNONAA >60 09/05/2015 0536   GFRAA >60 09/05/2015 0536    GFR Estimated Creatinine Clearance: 81.7 mL/min (by C-G formula based on Cr of 0.76). No results for input(s): LIPASE, AMYLASE in the last 72 hours. No results for input(s): CKTOTAL, CKMB, CKMBINDEX, TROPONINI in the last 72 hours. Invalid input(s): POCBNP No results for input(s): DDIMER in the last 72 hours. No  results for input(s): HGBA1C in the last 72 hours. No results for input(s): CHOL, HDL, LDLCALC, TRIG, CHOLHDL, LDLDIRECT in the last 72 hours. No results for input(s): TSH, T4TOTAL, T3FREE, THYROIDAB in the last 72 hours.  Invalid input(s): FREET3 No results for input(s): VITAMINB12, FOLATE, FERRITIN, TIBC, IRON, RETICCTPCT in the last 72 hours. Coags: No results for input(s): INR in the last 72 hours.  Invalid input(s): PT Microbiology: Recent Results (from the past 240 hour(s))  MRSA PCR Screening     Status: None   Collection Time: 09/02/15  6:10 PM  Result Value Ref Range Status   MRSA by PCR NEGATIVE NEGATIVE Final    Comment:        The GeneXpert MRSA Assay (FDA approved for NASAL specimens only), is one component of a comprehensive MRSA colonization surveillance program. It is not intended to diagnose MRSA infection nor to guide or monitor treatment for MRSA infections.     BRIEF HOSPITAL COURSE:  UGI bleed: Suspected bleeding from gastric varices. Bleeding has resolved. GI consulted, underwent EGD on 2/26 which did not show any stigmata of bleeding. Subsequently underwent a colonoscopy on 3/4, which was negative for any foci of bleeding as well. No further recommendations  from gastroenterology at this time, no bleeding for the past 48 hours, stable for discharge.   Active Problems: Acute blood loss anemia: Secondary to above, hemoglobin stable following 3 units of PRBC on admission. Last hemoglobin stable at 8.6. Please recheck CBC at next visit.   Splenic vein thrombosis: This was seen on MRI abdomen, gastroenterology initially thought that the patient may need a splenectomy. Subsequently was seen by general surgery, who thought that the patient did not require a splenectomy. Current recommendations from gastroenterology are for outpatient follow-up with GI for reevaluation.   5.6 cm solid mass anterior to the pancreas: Initially seen on CT abdomen-subsequently  underwent a MRI abdomen which is suggestive of a hemorrhagic/infected pseudocyst. However patient without any signs of infection, abdomen is nontender-no abdominal pain as well. Recommendations are to follow-up with gastroenterology for further reevaluation. Outpatient appointment with gastroenterology as already scheduled-see below.    Vitamin B12 deficiency: Continue vitamin B12 supplementation.  Chronic pancreatitis with Pancreatic pseudocysts: Seen on CT the mid-MRI confirms pseudocysts. Managed with supportive care, tolerating diet at the time of discharge. Has outpatient follow-up with gastroenterology.   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 Was maintained on  IV Rocephin for SBP prophylaxis. Has outpatient follow-up with gastroenterology for further workup and treatment.  Alcohol abuse: last drink one week prior to this admission. No evidence of withdrawal at this time. Have counseled patient to completely refrain from further alcohol usage. He understands.  Severe malnutrition: Nutrition evaluation completed-continue supplements on discharge.  TODAY-DAY OF DISCHARGE:  Subjective:   Hayaan Ty today has no headache,no chest abdominal pain,no new weakness tingling or numbness, feels much better wants to go home today.   Objective:   Blood pressure 115/58, pulse 69, temperature 98.3 F (36.8 C), temperature source Oral, resp. rate 18, height 6' (1.829 m), weight 61.1 kg (134 lb 11.2 oz), SpO2 100 %.  Intake/Output Summary (Last 24 hours) at 09/07/15 1552 Last data filed at 09/07/15 1200  Gross per 24 hour  Intake    320 ml  Output      0 ml  Net    320 ml   Filed Weights   09/02/15 1807  Weight: 61.1 kg (134 lb 11.2 oz)    Exam Awake Alert, Oriented *3, No new F.N deficits, Normal affect [place].AT,PERRAL Supple Neck,No JVD, No cervical lymphadenopathy appriciated.  Symmetrical Chest wall movement, Good air movement bilaterally,  CTAB RRR,No Gallops,Rubs or new Murmurs, No Parasternal Heave +ve B.Sounds, Abd Soft, Non tender, No organomegaly appriciated, No rebound -guarding or rigidity. No Cyanosis, Clubbing or edema, No new Rash or bruise  DISCHARGE CONDITION: Stable  DISPOSITION: Home  DISCHARGE INSTRUCTIONS:    Activity:  As tolerated   Get Medicines reviewed and adjusted: Please take all your medications with you for your next visit with your Primary MD  Please request your Primary MD to go over all hospital tests and procedure/radiological results at the follow up, please ask your Primary MD to get all Hospital records sent to his/her office.  If you experience worsening of your admission symptoms, develop shortness of breath, life threatening emergency, suicidal or homicidal thoughts you must seek medical attention immediately by calling 911 or calling your MD immediately  if symptoms less severe.  You must read complete instructions/literature along with all the possible adverse reactions/side effects for all the Medicines you take and that have been prescribed to you. Take any new Medicines after you have completely understood and accpet  all the possible adverse reactions/side effects.   Do not drive when taking Pain medications.   Do not take more than prescribed Pain, Sleep and Anxiety Medications  Special Instructions: If you have smoked or chewed Tobacco  in the last 2 yrs please stop smoking, stop any regular Alcohol  and or any Recreational drug use.  Wear Seat belts while driving.  Please note  You were cared for by a hospitalist during your hospital stay. Once you are discharged, your primary care physician will handle any further medical issues. Please note that NO REFILLS for any discharge medications will be authorized once you are discharged, as it is imperative that you return to your primary care physician (or establish a relationship with a primary care physician if you do not have  one) for your aftercare needs so that they can reassess your need for medications and monitor your lab values.   Diet recommendation: Heart Healthy diet  Discharge Instructions    Call MD for:  difficulty breathing, headache or visual disturbances    Complete by:  As directed      Call MD for:    Complete by:  As directed   Bloody stools, black stools, or vomited blood.     Diet - low sodium heart healthy    Complete by:  As directed      Discharge instructions    Complete by:  As directed   Refrain from further alcohol use     Increase activity slowly    Complete by:  As directed            Follow-up Information    Follow up with Manus Gunning, MD On 10/22/2015.   Specialty:  Gastroenterology   Why:  With GI doctor at 1:45 PM for follow up of pancreatitis and liver disease.    Contact information:   [address] [place] [address] [phone]       Follow up with Marcy Salvo, NP. Schedule an appointment as soon as possible for a visit in 1 week.   Specialty:  Nurse Practitioner   Why:  Hospital follow up, Repeat Complete Blood Count   Contact information:   [place] [address] [phone]       Total Time spent on discharge equals  45 minutes.  SignedOren Binet 09/07/2015 3:52 PM

## 2015-09-07 NOTE — Transfer of Care (Signed)
Immediate Anesthesia Transfer of Care Note  Patient: Alexander Schmitt  Procedure(s) Performed: Procedure(s): COLONOSCOPY (N/A)  Patient Location: Endoscopy Unit  Anesthesia Type:MAC  Level of Consciousness: awake, alert  and oriented  Airway & Oxygen Therapy: Patient Spontanous Breathing and Patient connected to nasal cannula oxygen  Post-op Assessment: Report given to RN and Post -op Vital signs reviewed and stable  Post vital signs: Reviewed and stable  Last Vitals:  Filed Vitals:   09/07/15 0543 09/07/15 0945  BP: 113/61 109/54  Pulse: 65 63  Temp: 36.3 C 36.5 C  Resp: 18 18    Complications: No apparent anesthesia complications

## 2015-09-07 NOTE — Interval H&P Note (Signed)
History and Physical Interval Note:  09/07/2015 11:04 AM  [name]  has presented today for surgery, with the diagnosis of hematochezia.  The various methods of treatment have been discussed with the patient and family. After consideration of risks, benefits and other options for treatment, the patient has consented to  Procedure(s): COLONOSCOPY (N/A) as a surgical intervention .  The patient's history has been reviewed, patient examined, no change in status, stable for surgery.  I have reviewed the patient's chart and labs.  Questions were answered to the patient's satisfaction.     Alexander Schmitt

## 2015-09-10 ENCOUNTER — Encounter (HOSPITAL_COMMUNITY): Payer: Self-pay | Admitting: Internal Medicine

## 2015-09-10 ENCOUNTER — Encounter: Payer: Self-pay | Admitting: Internal Medicine

## 2015-10-22 ENCOUNTER — Ambulatory Visit (INDEPENDENT_AMBULATORY_CARE_PROVIDER_SITE_OTHER): Payer: Medicaid Other | Admitting: Gastroenterology

## 2015-10-22 ENCOUNTER — Encounter: Payer: Self-pay | Admitting: Gastroenterology

## 2015-10-22 ENCOUNTER — Other Ambulatory Visit (INDEPENDENT_AMBULATORY_CARE_PROVIDER_SITE_OTHER): Payer: Medicaid Other

## 2015-10-22 VITALS — BP 86/60 | HR 84 | Ht 72.0 in | Wt 129.0 lb

## 2015-10-22 DIAGNOSIS — K861 Other chronic pancreatitis: Secondary | ICD-10-CM | POA: Diagnosis not present

## 2015-10-22 DIAGNOSIS — Z23 Encounter for immunization: Secondary | ICD-10-CM

## 2015-10-22 DIAGNOSIS — D735 Infarction of spleen: Secondary | ICD-10-CM | POA: Diagnosis not present

## 2015-10-22 DIAGNOSIS — I82890 Acute embolism and thrombosis of other specified veins: Secondary | ICD-10-CM

## 2015-10-22 DIAGNOSIS — I864 Gastric varices: Secondary | ICD-10-CM

## 2015-10-22 LAB — CBC WITH DIFFERENTIAL/PLATELET
BASOS PCT: 0.4 % (ref 0.0–3.0)
Basophils Absolute: 0 10*3/uL (ref 0.0–0.1)
EOS PCT: 0.6 % (ref 0.0–5.0)
Eosinophils Absolute: 0 10*3/uL (ref 0.0–0.7)
HCT: 32.5 % — ABNORMAL LOW (ref 39.0–52.0)
Hemoglobin: 10.7 g/dL — ABNORMAL LOW (ref 13.0–17.0)
LYMPHS ABS: 2.7 10*3/uL (ref 0.7–4.0)
Lymphocytes Relative: 33.6 % (ref 12.0–46.0)
MCHC: 32.9 g/dL (ref 30.0–36.0)
MCV: 85.8 fl (ref 78.0–100.0)
MONOS PCT: 5.6 % (ref 3.0–12.0)
Monocytes Absolute: 0.5 10*3/uL (ref 0.1–1.0)
NEUTROS ABS: 4.9 10*3/uL (ref 1.4–7.7)
NEUTROS PCT: 59.8 % (ref 43.0–77.0)
PLATELETS: 417 10*3/uL — AB (ref 150.0–400.0)
RBC: 3.79 Mil/uL — ABNORMAL LOW (ref 4.22–5.81)
RDW: 14.4 % (ref 11.5–15.5)
WBC: 8.1 10*3/uL (ref 4.0–10.5)

## 2015-10-22 LAB — COMPREHENSIVE METABOLIC PANEL
ALK PHOS: 79 U/L (ref 39–117)
ALT: 22 U/L (ref 0–53)
AST: 28 U/L (ref 0–37)
Albumin: 3.2 g/dL — ABNORMAL LOW (ref 3.5–5.2)
BILIRUBIN TOTAL: 0.3 mg/dL (ref 0.2–1.2)
BUN: 11 mg/dL (ref 6–23)
CO2: 29 meq/L (ref 19–32)
CREATININE: 0.76 mg/dL (ref 0.40–1.50)
Calcium: 9.3 mg/dL (ref 8.4–10.5)
Chloride: 102 mEq/L (ref 96–112)
GFR: 132.78 mL/min (ref >60.00)
GLUCOSE: 91 mg/dL (ref 70–99)
Potassium: 4.2 mEq/L (ref 3.5–5.1)
SODIUM: 139 meq/L (ref 135–145)
TOTAL PROTEIN: 7 g/dL (ref 6.0–8.3)

## 2015-10-22 LAB — HEPATITIS A ANTIBODY, TOTAL: Hep A Total Ab: REACTIVE — AB

## 2015-10-22 MED ORDER — OMEPRAZOLE 20 MG PO CPDR: 20.0000 mg | DELAYED_RELEASE_CAPSULE | Freq: Every day | ORAL | Status: DC

## 2015-10-22 NOTE — Patient Instructions (Addendum)
Your physician has requested that you go to the basement for the  lab work before leaving today.   Today you have been given a PCV13 vaccine and also a Hepatitis B vaccine.  We will see you 11/21/15 at [facility] for your next Hepatitis B vaccine.    We have sent the following medications to your pharmacy for you to pick up at your convenience: Omeprazole  You have been scheduled for an MRCP at [facility] on 11/07/15. Your appointment time is 10:00AM. Please arrive 15 minutes prior to your appointment time for registration purposes. Please make certain not to have anything to eat or drink 4 hours prior to your test. In addition, if you have any metal in your body, have a pacemaker or defibrillator, please be sure to let your ordering physician know. This test typically takes 45 minutes to 1 hour to complete.   I appreciate the opportunity to care for you.

## 2015-10-22 NOTE — Progress Notes (Signed)
HPI :  65 y/o male with alcoholism, recurrent pancreatitis with chronic pancreatitis, recently hospitalized for upper GI bleeding, here for a follow up. He presented at the end of February with dark stools / melena and tachycardia. 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 He had no active bleeding noted on EGD during his procedure, unclear if he had bleeding from gastric varices vs. the pancreas itself in relation to fluid collections in the pancreas (hemorrhagic cyst vs. Pseudoaneurysm). He also had a colonoscopy showing one small adenoma and diverticulosis. He was evaluated by general surgery for possible splenectomy, and it was declined at the time given he stopped bleeding. He had endorsed significant alcohol use prior to the hospitalization.  He reports feeling better since discharge, is here for follow up today. No abdominal pains. He is eating okay. No nausea or vomiting. He denies blood in the stools. Passing brown stools. He thinks he is slowly gaining weight back.  He stopped drinking alcohol. He thinks his last drink was prior to admission. He was drinking wine previouslt, he was drinking about 3 pints of wine per day + liquer.  He has been hospitalized multuple times for pancreatitis in the past.   He has had a tetanus shot recently, and a remote pneumovax. He is not taking any medications. He is taking daily vitamin and tylenol PRN. He ran out of protonix which was given to him previously during hospitalization.   EUS 07/2013 with Dr. Paulita Fujita, consistent with chronic pancreatitis and pseudocyst. The patient has since "fired" [facility] and is now being seen by our practice.    Past Medical History  Diagnosis Date  . SBO (small bowel obstruction) ([place]) 11/2005    medical mgt  . History of stomach ulcers   .  Pancreatitis [id number]    acute alcoholic and chronic; pseudocyst and pancreatic calcifications 11/2011  . GERD (gastroesophageal reflux disease)     occasional   . Arthritis     left shoulder   . Cirrhosis ([place]) 2013  . Effusion of right knee 03/2014  . Ascites 11/2005, 05/2012    05/2012:4 liter paracentesis, no SBP   . Diabetes mellitus ([place]) 2017    08/2015 sugars into 200s but hyperglycemia dates to at least 2013.   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 Upper GI bleed 08/2015    suspected gastric varices;   . Anemia 08/2015    PRBC x 2 units 08/2015.   . Colon polyp 09/07/15    ascenging colon, minor/scattered diverticulosis on colonoscopy  . Alcoholism ([place]) since before 2013     Past Surgical History  Procedure Laterality Date  . Inguinal hernia repair Right 09/29/2012    Procedure: HERNIA REPAIR INGUINAL ADULT;  Surgeon: Imogene Burn. Tsuei, MD;  Location: [facility];  Service: General;  Laterality: Right;  . Umbilical hernia repair N/A 09/29/2012    Procedure: HERNIA REPAIR UMBILICAL ADULT;  Surgeon: Imogene Burn. Georgette Dover, MD;  Location: [facility];  Service: General;  Laterality: N/A;  . Insertion of mesh Right 09/29/2012    Procedure: INSERTION OF MESH;  Surgeon: Imogene Burn. Georgette Dover, MD;  Location: [facility];  Service: General;  Laterality: Right;  . Eus N/A 07/13/2013    Procedure: ESOPHAGEAL ENDOSCOPIC ULTRASOUND (EUS) RADIAL;  Surgeon: Arta Silence, MD;  Location: [facility];  Service:  Endoscopy;  Laterality: N/A;  . Esophagogastroduodenoscopy Left 09/02/2015    Procedure: ESOPHAGOGASTRODUODENOSCOPY (EGD);  Surgeon: Manus Gunning, MD;  Location: [place];  Service: Gastroenterology;  Laterality: Left;  . Colonoscopy N/A 09/07/2015    Procedure: COLONOSCOPY;  Surgeon: Irene Shipper, MD;  Location: [place];  Service: Endoscopy;  Laterality: N/A;   Family History  Problem Relation Age of Onset  . Hypertension Father    Social History  Substance Use Topics  . Smoking status: Light  Tobacco Smoker -- 0.25 packs/day for 40 years    Types: Cigarettes  . Smokeless tobacco: Never Used     Comment: 2-3 cigarettes per day  . Alcohol Use: Yes     Comment: states stopped 3 months ago, none since   Current Outpatient Prescriptions  Medication Sig Dispense Refill  . Multiple Vitamin (MULTIVITAMIN WITH MINERALS) TABS tablet Take 1 tablet by mouth daily. 30 tablet 0  . acetaminophen (TYLENOL) 325 MG tablet Take 650 mg by mouth every 6 (six) hours as needed (pain). Reported on 10/22/2015    . feeding supplement (BOOST / RESOURCE BREEZE) LIQD Take 1 Container by mouth 3 (three) times daily between meals. (Patient not taking: Reported on 10/22/2015) 90 Container 0  . folic acid (FOLVITE) 1 MG tablet Take 1 tablet (1 mg total) by mouth daily. (Patient not taking: Reported on 10/22/2015) 30 tablet 0  . omeprazole (PRILOSEC) 20 MG capsule Take 1 capsule (20 mg total) by mouth daily. 90 capsule 1  . pantoprazole (PROTONIX) 40 MG tablet Take 1 tablet (40 mg total) by mouth daily at 6 (six) AM. (Patient not taking: Reported on 10/22/2015) 30 tablet 0  . thiamine 100 MG tablet Take 1 tablet (100 mg total) by mouth daily. (Patient not taking: Reported on 10/22/2015) 30 tablet 0   No current facility-administered medications for this visit.   No Known Allergies   Review of Systems: All systems reviewed and negative except where noted in HPI.   Lab Results  Component Value Date   WBC 8.1 10/22/2015   HGB 10.7* 10/22/2015   HCT 32.5* 10/22/2015   MCV 85.8 10/22/2015   PLT 417.0* 10/22/2015   Lab Results  Component Value Date   CREATININE 0.76 10/22/2015   BUN 11 10/22/2015   NA 139 10/22/2015   K 4.2 10/22/2015   CL 102 10/22/2015   CO2 29 10/22/2015    Lab Results  Component Value Date   ALT 22 10/22/2015   AST 28 10/22/2015   ALKPHOS 79 10/22/2015   BILITOT 0.3 10/22/2015   CBC Latest Ref Rng 10/22/2015 09/06/2015 09/05/2015  WBC 4.0 - 10.5 K/uL 8.1 7.2 5.6  Hemoglobin 13.0 -  17.0 g/dL 10.7(L) 8.6(L) 7.9(L)  Hematocrit 39.0 - 52.0 % 32.5(L) 25.4(L) 24.5(L)  Platelets 150.0 - 400.0 K/uL 417.0(H) 334 319      Physical Exam: BP 86/60 mmHg  Pulse 84  Ht 6' (1.829 m)  Wt 129 lb (58.514 kg)  BMI 17.49 kg/m2 Constitutional: Pleasant,well-developed, male in no acute distress. HEENT: Normocephalic and atraumatic. Conjunctivae are normal. No scleral icterus. Neck supple.  Cardiovascular: Normal rate, regular rhythm.  Pulmonary/chest: Effort normal and breath sounds normal. No wheezing, rales or rhonchi. Abdominal: Soft, thin, nontender. Bowel sounds active throughout. There are no masses palpable. . Extremities: no edema Lymphadenopathy: No cervical adenopathy noted. Neurological: Alert and oriented to person place and time. Skin: Skin is warm and dry. No rashes noted. Psychiatric: Normal mood and affect. Behavior is normal.  ASSESSMENT AND PLAN: 65 y/o male with history of alcoholism, chronic pancreatitis, who presented with an upper GI bleed in February as outlined above. He had large fluid collections associated with the pancreas (hemorrhagic cyst?) with splenic vein thrombosis, and isolated gastric varices. Unclear if he had bleeding from gastric varices vs. hemosuccus from the pancreas if he had hemorrhagic cyst or pseudoaneurysm. He does not appear to have cirrhosis or portal hypertension, thus if he were to have rebleeding from gastric varices, the treatment would be splenectomy (not TIPS) for which he was already evaluated by surgery, and given recent vaccinations.   Moving forward, I obtained labs today and his Hgb is improved, LFTs stable. I would like to obtain an MRCP in a few weeks (at least 2 months from last exam) to see if the cystic collections of the pancreas have intervaly resolved or if they have persisted / worsening, and will also re-evaluate the splenic vasculature / varices. I will await this result. In the interim, counseled him on continue  alcohol abstinence and he is going a great job with this. I think risk of recurrent pancreatitis is quite high with continued alcohol use in the future. I also put him back on PPI prophylaxis with omeprazole 20mg  daily. He should otherwise avoid all NSAIDs. I will contact him with MRCP result, may consider EUS pending the results.  Of note, we will provide Prevnar pneumococcal vaccine today given he is due for this, and hepatitis B vaccination. I will also test him for hep A immunity and vaccinate if needed. Recall colonoscopy in 5 years pending his health and if he wishes to continue with surveillance colonoscopy given one small adenoma removed on colonoscopy.   Manson Cellar, MD [facility] Pager [phone]  CC: Marcy Salvo, NP

## 2015-11-07 ENCOUNTER — Ambulatory Visit (HOSPITAL_COMMUNITY)
Admission: RE | Admit: 2015-11-07 | Discharge: 2015-11-07 | Disposition: A | Discharge status: Home / Self Care | Payer: Medicaid Other | Source: Ambulatory Visit | Attending: Gastroenterology | Admitting: Gastroenterology

## 2015-11-07 ENCOUNTER — Other Ambulatory Visit: Payer: Self-pay | Admitting: Gastroenterology

## 2015-11-07 DIAGNOSIS — I82890 Acute embolism and thrombosis of other specified veins: Secondary | ICD-10-CM

## 2015-11-07 DIAGNOSIS — D735 Infarction of spleen: Secondary | ICD-10-CM | POA: Insufficient documentation

## 2015-11-07 DIAGNOSIS — K861 Other chronic pancreatitis: Secondary | ICD-10-CM | POA: Insufficient documentation

## 2015-11-07 MED ORDER — GADOBENATE DIMEGLUMINE 529 MG/ML IV SOLN
15.0000 mL | Freq: Once | INTRAVENOUS | Status: AC | PRN
  Administered 2015-11-07: 13 mL via INTRAVENOUS

## 2015-11-21 ENCOUNTER — Ambulatory Visit (INDEPENDENT_AMBULATORY_CARE_PROVIDER_SITE_OTHER): Payer: Medicaid Other | Admitting: Gastroenterology

## 2015-11-21 DIAGNOSIS — Z23 Encounter for immunization: Secondary | ICD-10-CM | POA: Diagnosis not present

## 2015-11-22 ENCOUNTER — Ambulatory Visit (AMBULATORY_SURGERY_CENTER): Payer: Self-pay

## 2015-11-22 VITALS — Ht 72.0 in | Wt 136.4 lb

## 2015-11-22 DIAGNOSIS — I864 Gastric varices: Secondary | ICD-10-CM

## 2015-11-22 NOTE — Progress Notes (Signed)
No allergies to eggs or soy No past problems with anesthesia No home oxygen No diet meds  No internet 

## 2015-12-12 ENCOUNTER — Ambulatory Visit (AMBULATORY_SURGERY_CENTER): Payer: Medicaid Other | Admitting: Gastroenterology

## 2015-12-12 ENCOUNTER — Encounter: Payer: Self-pay | Admitting: Gastroenterology

## 2015-12-12 VITALS — BP 134/76 | HR 65 | Temp 96.0°F | Resp 10 | Ht 72.0 in | Wt 136.0 lb

## 2015-12-12 DIAGNOSIS — I864 Gastric varices: Secondary | ICD-10-CM

## 2015-12-12 MED ORDER — SODIUM CHLORIDE 0.9 % IV SOLN: 500.0000 mL | INTRAVENOUS | Status: DC

## 2015-12-12 NOTE — Progress Notes (Signed)
A/ox3 pleased with MAC, report to Sarah RN 

## 2015-12-12 NOTE — Progress Notes (Signed)
Called to room to assist during endoscopic procedure.  Patient ID and intended procedure confirmed with present staff. Received instructions for my participation in the procedure from the performing physician.  

## 2015-12-12 NOTE — Progress Notes (Signed)
Please fax biopsy results to [facility] for transplant workup. [phone]

## 2015-12-12 NOTE — Patient Instructions (Signed)
YOU HAD AN ENDOSCOPIC PROCEDURE TODAY AT [facility]:   Refer to the procedure report that was given to you for any specific questions about what was found during the examination.  If the procedure report does not answer your questions, please call your gastroenterologist to clarify.  If you requested that your care partner not be given the details of your procedure findings, then the procedure report has been included in a sealed envelope for you to review at your convenience later.  YOU SHOULD EXPECT: Some feelings of bloating in the abdomen. Passage of more gas than usual.  Walking can help get rid of the air that was put into your GI tract during the procedure and reduce the bloating. If you had a lower endoscopy (such as a colonoscopy or flexible sigmoidoscopy) you may notice spotting of blood in your stool or on the toilet paper. If you underwent a bowel prep for your procedure, you may not have a normal bowel movement for a few days.  Please Note:  You might notice some irritation and congestion in your nose or some drainage.  This is from the oxygen used during your procedure.  There is no need for concern and it should clear up in a day or so.  SYMPTOMS TO REPORT IMMEDIATELY:   Following lower endoscopy (colonoscopy or flexible sigmoidoscopy):  Excessive amounts of blood in the stool  Significant tenderness or worsening of abdominal pains  Swelling of the abdomen that is new, acute  Fever of 100F or higher   Following upper endoscopy (EGD)  Vomiting of blood or coffee ground material  New chest pain or pain under the shoulder blades  Painful or persistently difficult swallowing  New shortness of breath  Fever of 100F or higher  Black, tarry-looking stools  For urgent or emergent issues, a gastroenterologist can be reached at any hour by calling [phone].   DIET: Your first meal following the procedure should be a small meal and then it is ok to progress to  your normal diet. Heavy or fried foods are harder to digest and may make you feel nauseous or bloated.  Likewise, meals heavy in dairy and vegetables can increase bloating.  Drink plenty of fluids but you should avoid alcoholic beverages for 24 hours.   Please read all handouts given to you today.  ACTIVITY:  You should plan to take it easy for the rest of today and you should NOT DRIVE or use heavy machinery until tomorrow (because of the sedation medicines used during the test).    FOLLOW UP: Our staff will call the number listed on your records the next business day following your procedure to check on you and address any questions or concerns that you may have regarding the information given to you following your procedure. If we do not reach you, we will leave a message.  However, if you are feeling well and you are not experiencing any problems, there is no need to return our call.  We will assume that you have returned to your regular daily activities without incident.  If any biopsies were taken you will be contacted by phone or by letter within the next 1-3 weeks.  Please call us at [phone] if you have not heard about the biopsies in 3 weeks.    SIGNATURES/CONFIDENTIALITY: You and/or your care partner have signed paperwork which will be entered into your electronic medical record.  These signatures attest to the fact that that the  information above on your After Visit Summary has been reviewed and is understood.  Full responsibility of the confidentiality of this discharge information lies with you and/or your care-partner.  Thank you for letting us take care of your healthcare needs today.

## 2015-12-12 NOTE — Op Note (Signed)
[facility] Patient Name: Alexander Schmitt Procedure Date: 12/12/2015 8:37 AM MRN: [id number] Endoscopist: Remo Lipps P. Havery Moros , MD Age: 65 Referring MD:  Date of Birth: 1950-07-28 Gender: Male Procedure:                Upper GI endoscopy Indications:              Surveillance procedure - history of gastric varices                            from splenic vein thrombosis, suspected abmormal                            mucosa at the ampulla on last endoscopy Medicines:                Monitored Anesthesia Care Procedure:                Pre-Anesthesia Assessment:                           - Prior to the procedure, a History and Physical                            was performed, and patient medications and                            allergies were reviewed. The patient's tolerance of                            previous anesthesia was also reviewed. The risks                            and benefits of the procedure and the sedation                            options and risks were discussed with the patient.                            All questions were answered, and informed consent                            was obtained. Prior Anticoagulants: The patient has                            taken no previous anticoagulant or antiplatelet                            agents. ASA Grade Assessment: III - A patient with                            severe systemic disease. After reviewing the risks                            and benefits, the patient was deemed in  satisfactory condition to undergo the procedure.                           After obtaining informed consent, the endoscope was                            passed under direct vision. Throughout the                            procedure, the patient's blood pressure, pulse, and                            oxygen saturations were monitored continuously. The                            Model GIF-[id number])  scope was introduced                            through the mouth, and advanced to the second part                            of duodenum. The upper GI endoscopy was                            accomplished without difficulty. The patient                            tolerated the procedure well. Scope In: Scope Out: Findings:                 Esophagogastric landmarks were identified: the                            Z-line was found at 38 cm, the gastroesophageal                            junction was found at 38 cm and the upper extent of                            the gastric folds was found at 38 cm from the                            incisors.                           The exam of the esophagus was otherwise normal. No                            esophageal varices were seen.                           Large varices with no bleeding were found in the                            gastric fundus.  There were no stigmata of recent                            bleeding.                           The exam of the stomach was otherwise normal.                           Nodular mucosa was found in the duodenal bulb                            grossly consistent with ectopic gastric mucosa.                            Biopsies were taken with a cold forceps for                            histology to ensure no adenomatous change                           The ampulla and second portion of the duodenum were                            normal. Complications:            No immediate complications. Estimated blood loss:                            Minimal. Estimated Blood Loss:     Estimated blood loss was minimal. Impression:               - Esophagogastric landmarks identified.                           - Large gastric varices, without bleeding.                           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 Biopsied.                            - Normal ampulla and second portion of the duodenum. Recommendation:           - Patient has a contact number available for                            emergencies. The signs and symptoms of potential                            delayed complications were discussed with the                            patient. Return to normal activities tomorrow.  Written discharge instructions were provided to the                            patient.                           - Resume previous diet.                           - Continue present medications.                           - Await pathology results.                           - Follow up with surgical consultation for possible                            splenectomy given large gastric varices due to                            splenic vein thrombosis (related to chronic                            pancreatitis) with suspect prior bleeding from this                            site [employer]. Sharda Keddy, MD 12/12/2015 9:03:02 AM This report has been signed electronically.

## 2015-12-13 ENCOUNTER — Telehealth: Payer: Self-pay | Admitting: *Deleted

## 2015-12-13 NOTE — Telephone Encounter (Signed)
Spoke with [facility] and scheduled patient with Dr. [place] Dienes on 12/17/15 at 10:00 AM. Patient's wife given appointment date and time.

## 2015-12-13 NOTE — Telephone Encounter (Signed)
  Follow up Call-  Call back number 12/12/2015  Post procedure Call Back phone  # [phone] NUMBER OK TO SPEAK WITH HER.  Permission to leave phone message Yes     Patient questions:  Do you have a fever, pain , or abdominal swelling? No. Pain Score  0 *  Have you tolerated food without any problems? Yes.    Have you been able to return to your normal activities? Yes.    Do you have any questions about your discharge instructions: Diet   No. Medications  No. Follow up visit  No.  Do you have questions or concerns about your Care? No.  Actions: * If pain score is 4 or above: No action needed, pain <4.

## 2015-12-24 ENCOUNTER — Encounter: Payer: Self-pay | Admitting: Gastroenterology

## 2016-03-31 ENCOUNTER — Telehealth: Payer: Self-pay | Admitting: Gastroenterology

## 2016-03-31 NOTE — Telephone Encounter (Signed)
Calling to schedule his final Hep B vaccination. First injection was 10/22/15. Scheduled for 04/24/16

## 2016-04-15 ENCOUNTER — Other Ambulatory Visit: Payer: Self-pay | Admitting: Gastroenterology

## 2016-04-24 ENCOUNTER — Ambulatory Visit (INDEPENDENT_AMBULATORY_CARE_PROVIDER_SITE_OTHER): Payer: Medicaid Other | Admitting: Gastroenterology

## 2016-04-24 DIAGNOSIS — Z23 Encounter for immunization: Secondary | ICD-10-CM | POA: Diagnosis not present

## 2016-08-29 ENCOUNTER — Emergency Department (HOSPITAL_COMMUNITY)
Admission: EM | Admit: 2016-08-29 | Discharge: 2016-08-29 | Disposition: A | Discharge status: Home / Self Care | Payer: Medicaid Other | Attending: Emergency Medicine | Admitting: Emergency Medicine

## 2016-08-29 ENCOUNTER — Encounter (HOSPITAL_COMMUNITY): Payer: Self-pay

## 2016-08-29 DIAGNOSIS — E119 Type 2 diabetes mellitus without complications: Secondary | ICD-10-CM | POA: Diagnosis not present

## 2016-08-29 DIAGNOSIS — M720 Palmar fascial fibromatosis [Dupuytren]: Secondary | ICD-10-CM

## 2016-08-29 DIAGNOSIS — W268XXA Contact with other sharp object(s), not elsewhere classified, initial encounter: Secondary | ICD-10-CM | POA: Diagnosis not present

## 2016-08-29 DIAGNOSIS — Y9389 Activity, other specified: Secondary | ICD-10-CM | POA: Diagnosis not present

## 2016-08-29 DIAGNOSIS — Y999 Unspecified external cause status: Secondary | ICD-10-CM | POA: Diagnosis not present

## 2016-08-29 DIAGNOSIS — Y929 Unspecified place or not applicable: Secondary | ICD-10-CM | POA: Diagnosis not present

## 2016-08-29 DIAGNOSIS — F17210 Nicotine dependence, cigarettes, uncomplicated: Secondary | ICD-10-CM | POA: Diagnosis not present

## 2016-08-29 DIAGNOSIS — S61214A Laceration without foreign body of right ring finger without damage to nail, initial encounter: Secondary | ICD-10-CM | POA: Diagnosis present

## 2016-08-29 MED ORDER — LIDOCAINE HCL (PF) 1 % IJ SOLN
5.0000 mL | Freq: Once | INTRAMUSCULAR | Status: AC
  Administered 2016-08-29: 5 mL via INTRADERMAL
  Filled 2016-08-29: qty 5

## 2016-08-29 NOTE — ED Notes (Signed)
Pt comfortable with discharge and follow up instructions. Pt declines wheelchair, escorted to waiting area by this RN. Rx x0 

## 2016-08-29 NOTE — ED Provider Notes (Signed)
[facility] DEPT Provider Note   CSN: [id number] Arrival date & time: 08/29/16  1203   By signing my name below, I, Neta Mends, attest that this documentation has been prepared under the direction and in the presence of Margarita Mail, PA-C. Electronically Signed: Neta Mends, ED Scribe. 08/29/2016. 1:14 PM.   History   Chief Complaint Chief Complaint  Patient presents with  . Extremity Laceration    The history is provided by the patient. No language interpreter was used.  HPI Comments:  LASZLO FORNWALT is a 66 y.o. male who presents to the Emergency Department complaining of a wound sustained to the right ring finger that occurred PTA. Pt reports that he was working on a car motor and cut his hand on a loose cable. Tetanus is UTD.  Bleeding is controlled with pressure dressing. Pt denies other associated symptoms.    Past Medical History:  Diagnosis Date  . Alcoholism ([place]) since before 2013  . Anemia 08/2015   PRBC x 2 units 08/2015.   . Arthritis    left shoulder   . Ascites 11/2005, 05/2012   05/2012:4 liter paracentesis, no SBP   . Cirrhosis ([place]) 2013  . Colon polyp 09/07/15   ascenging colon, minor/scattered diverticulosis on colonoscopy  . Diabetes mellitus without complication ([place])    PT. DENIES  . Effusion of right knee 03/2014  . GERD (gastroesophageal reflux disease)    occasional   . History of stomach ulcers   . Pancreatitis [id number]   acute alcoholic and chronic; pseudocyst and pancreatic calcifications 11/2011  . SBO (small bowel obstruction) 11/2005   medical mgt  . 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 Upper GI bleed 08/2015   suspected gastric varices;     Patient Active Problem List   Diagnosis Date Noted  . Benign neoplasm of ascending colon   . Abdominal discomfort   . Melena   . Splenic vein thrombosis   . Asplenia   . Abnormal CT of the abdomen   . Gastric varices   . Pancreatic mass   . Acute blood loss anemia   . UGI bleed  09/02/2015  . Alcohol abuse 09/02/2015  . Abdominal pain in male   . Protein-calorie malnutrition, severe ([place]) 02/04/2014  . 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 Malnutrition of moderate degree ([place]) 05/04/2013  . Pancreatic pseudocyst 05/04/2013  . Alcohol abuse, in remission 05/03/2013  . Right inguinal hernia 09/20/2012  . Umbilical hernia [id number]  . Ascites 05/24/2012  . Cirrhosis ([place]) 05/24/2012  . 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 COLONOSCOPY N/A 09/07/2015   Procedure: COLONOSCOPY;  Surgeon: Irene Shipper, MD;  Location: [facility] ENDOSCOPY;  Service: Endoscopy;  Laterality: N/A;  . COLONOSCOPY    . ESOPHAGOGASTRODUODENOSCOPY Left 09/02/2015   Procedure: ESOPHAGOGASTRODUODENOSCOPY (EGD);  Surgeon: Manus Gunning, MD;  Location: [facility];  Service: Gastroenterology;  Laterality: Left;  . EUS N/A 07/13/2013   Procedure: ESOPHAGEAL ENDOSCOPIC ULTRASOUND (EUS) RADIAL;  Surgeon: Arta Silence, MD;  Location: [facility] ENDOSCOPY;  Service: Endoscopy;  Laterality: N/A;  . INGUINAL HERNIA REPAIR Right 09/29/2012   Procedure: HERNIA REPAIR INGUINAL ADULT;  Surgeon: Imogene Burn. Georgette Dover, MD;  Location: [facility] ORS;  Service: General;  Laterality: Right;  . INSERTION OF MESH Right 09/29/2012   Procedure: INSERTION OF MESH;  Surgeon: Imogene Burn. Georgette Dover, MD;  Location: [facility] ORS;  Service: General;  Laterality: Right;  . UMBILICAL HERNIA REPAIR N/A 09/29/2012  Procedure: HERNIA REPAIR UMBILICAL ADULT;  Surgeon: Imogene Burn. Georgette Dover, MD;  Location: [facility] ORS;  Service: General;  Laterality: N/A;       Home Medications    Prior to Admission medications   Medication Sig Start Date End Date Taking? Authorizing Provider  acetaminophen (TYLENOL) 325 MG tablet Take 650 mg by mouth every 6 (six) hours as needed (pain). Reported on 10/22/2015    Historical Provider, MD  Multiple Vitamin (MULTIVITAMIN WITH MINERALS) TABS tablet Take 1 tablet by mouth  daily. 09/07/15   Shanker Kristeen Mans, MD  omeprazole (PRILOSEC) 20 MG capsule TAKE 1 CAPSULE (20 MG TOTAL) BY MOUTH DAILY. 04/15/16   Manus Gunning, MD  pantoprazole (PROTONIX) 40 MG tablet Take 1 tablet (40 mg total) by mouth daily at 6 (six) AM. Patient not taking: Reported on 12/12/2015 09/07/15   Jonetta Osgood, MD    Family History Family History  Problem Relation Age of Onset  . Hypertension Father   . Colon cancer Neg Hx     Social History Social History  Substance Use Topics  . Smoking status: Light Tobacco Smoker    Packs/day: 0.25    Years: 40.00    Types: Cigarettes  . Smokeless tobacco: Never Used     Comment: 2-3 cigarettes per day  . Alcohol use 0.0 oz/week     Comment: states stopped 3 months ago, none since     Allergies   Patient has no known allergies.   Review of Systems Review of Systems  Musculoskeletal: Positive for myalgias.  Skin: Positive for wound.     Physical Exam Updated Vital Signs BP 137/83 (BP Location: Left Arm)   Pulse 90   Temp 97.8 F (36.6 C) (Oral)   Resp 18   SpO2 95%   Physical Exam  Constitutional: He appears well-developed and well-nourished. No distress.  HENT:  Head: Normocephalic and atraumatic.  Eyes: Conjunctivae are normal.  Cardiovascular: Normal rate.   Pulmonary/Chest: Effort normal.  Abdominal: He exhibits no distension.  Musculoskeletal:  Contracted tendon of the palmar surface   Neurological: He is alert.  Skin: Skin is warm and dry.  4cm triangular evulsion of palmar surface of right 4th digit. Contracture of tendon leading to this digit.   Psychiatric: He has a normal mood and affect.  Nursing note and vitals reviewed.    ED Treatments / Results  DIAGNOSTIC STUDIES:  Oxygen Saturation is 95% on RA, normal by my interpretation.    COORDINATION OF CARE:  1:11 PM Will repair laceration with 6 prolene sutures. Discussed treatment plan with pt at bedside and pt agreed to plan.  Labs (all  labs ordered are listed, but only abnormal results are displayed) Labs Reviewed - No data to display  EKG  EKG Interpretation None       Radiology No results found.  Procedures Procedures (including critical care time) LACERATION REPAIR Performed by: Margarita Mail Authorized by: Margarita Mail Consent: Verbal consent obtained. Risks and benefits: risks, benefits and alternatives were discussed Consent given by: patient Patient identity confirmed: provided demographic data Prepped and Draped in normal sterile fashion Wound explored  Laceration Location: right 4th digit     Laceration Length: 4 cm  No Foreign Bodies seen or palpated  Anesthesia: local infiltration  Local anesthetic: lidocaine 1% w/o epinephrine  Anesthetic total: 10 ml  Irrigation method: syringe Amount of cleaning: standard  Skin closure: 5.0 prolene  Number of sutures: 6  Technique: si  Patient tolerance: Patient tolerated  the procedure well with no immediate complications.  Medications Ordered in ED Medications  lidocaine (PF) (XYLOCAINE) 1 % injection 5 mL (5 mLs Intradermal Given by Other 08/29/16 1232)  lidocaine (PF) (XYLOCAINE) 1 % injection 5 mL (5 mLs Intradermal Given by Other 08/29/16 1232)     Initial Impression / Assessment and Plan / ED Course   I have reviewed the triage vital signs and the nursing notes.  Pertinent labs & imaging results that were available during my care of the patient were reviewed by me and considered in my medical decision making (see chart for details).     Tdap booster given.Pressure irrigation performed. Laceration occurred < 8 hours prior to repair which was well tolerated. Pt has no co morbidities to effect normal wound healing. Discussed suture home care w pt and answered questions. Pt to f-u for wound check and suture removal in 7 days. Pt is hemodynamically stable w no complaints prior to dc.     Final Clinical Impressions(s) / ED Diagnoses    Final diagnoses:  Laceration of right ring finger without foreign body without damage to nail, initial encounter  Dupuytren's contracture of right hand    New Prescriptions New Prescriptions   No medications on file  I personally performed the services described in this documentation, which was scribed in my presence. The recorded information has been reviewed and is accurate.       Margarita Mail, PA-C 09/03/16 2151    Duffy Bruce, MD 09/04/16 1710

## 2016-08-29 NOTE — ED Triage Notes (Signed)
Pt presents with "v-shaped" un-approximated laceration to right ring finger in which he states he cut it on a cable on a car motor today. Hands are smothers in black motor oil from. Bleeding is controlled at triage with wash cloth from pts home.

## 2016-11-10 ENCOUNTER — Other Ambulatory Visit: Payer: Self-pay | Admitting: Gastroenterology

## 2018-01-23 ENCOUNTER — Emergency Department (HOSPITAL_COMMUNITY)
Admission: EM | Admit: 2018-01-23 | Discharge: 2018-01-23 | Disposition: A | Discharge status: Home / Self Care | Payer: Medicare Other | Attending: Emergency Medicine | Admitting: Emergency Medicine

## 2018-01-23 ENCOUNTER — Encounter (HOSPITAL_COMMUNITY): Payer: Self-pay | Admitting: Emergency Medicine

## 2018-01-23 ENCOUNTER — Emergency Department (HOSPITAL_COMMUNITY): Payer: Medicare Other

## 2018-01-23 DIAGNOSIS — Y939 Activity, unspecified: Secondary | ICD-10-CM | POA: Insufficient documentation

## 2018-01-23 DIAGNOSIS — Y999 Unspecified external cause status: Secondary | ICD-10-CM | POA: Insufficient documentation

## 2018-01-23 DIAGNOSIS — S0990XA Unspecified injury of head, initial encounter: Secondary | ICD-10-CM

## 2018-01-23 DIAGNOSIS — S0101XA Laceration without foreign body of scalp, initial encounter: Secondary | ICD-10-CM | POA: Insufficient documentation

## 2018-01-23 DIAGNOSIS — Z23 Encounter for immunization: Secondary | ICD-10-CM | POA: Diagnosis not present

## 2018-01-23 DIAGNOSIS — Y929 Unspecified place or not applicable: Secondary | ICD-10-CM | POA: Insufficient documentation

## 2018-01-23 DIAGNOSIS — E119 Type 2 diabetes mellitus without complications: Secondary | ICD-10-CM | POA: Diagnosis not present

## 2018-01-23 DIAGNOSIS — F17210 Nicotine dependence, cigarettes, uncomplicated: Secondary | ICD-10-CM | POA: Insufficient documentation

## 2018-01-23 MED ORDER — LIDOCAINE-EPINEPHRINE (PF) 2 %-1:200000 IJ SOLN
10.0000 mL | Freq: Once | INTRAMUSCULAR | Status: AC
  Administered 2018-01-23: 10 mL
  Filled 2018-01-23: qty 20

## 2018-01-23 MED ORDER — TETANUS-DIPHTH-ACELL PERTUSSIS 5-2.5-18.5 LF-MCG/0.5 IM SUSP
0.5000 mL | Freq: Once | INTRAMUSCULAR | Status: AC
  Administered 2018-01-23: 0.5 mL via INTRAMUSCULAR
  Filled 2018-01-23: qty 0.5

## 2018-01-23 NOTE — ED Notes (Signed)
ED Provider at bedside. 

## 2018-01-23 NOTE — ED Notes (Signed)
Pt understood dc material. NAD noted. 
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
## 2018-01-23 NOTE — Discharge Instructions (Addendum)

## 2018-08-14 ENCOUNTER — Inpatient Hospital Stay (HOSPITAL_COMMUNITY)
Admission: EM | Admit: 2018-08-14 | Discharge: 2018-08-16 | DRG: 084 | Disposition: A | Discharge status: Home / Self Care | Payer: Medicare Other | Attending: General Surgery | Admitting: General Surgery

## 2018-08-14 ENCOUNTER — Observation Stay (HOSPITAL_COMMUNITY): Payer: Medicare Other

## 2018-08-14 ENCOUNTER — Emergency Department (HOSPITAL_COMMUNITY): Payer: Medicare Other

## 2018-08-14 ENCOUNTER — Other Ambulatory Visit: Payer: Self-pay

## 2018-08-14 ENCOUNTER — Encounter (HOSPITAL_COMMUNITY): Payer: Self-pay

## 2018-08-14 DIAGNOSIS — I629 Nontraumatic intracranial hemorrhage, unspecified: Secondary | ICD-10-CM

## 2018-08-14 DIAGNOSIS — Z8249 Family history of ischemic heart disease and other diseases of the circulatory system: Secondary | ICD-10-CM

## 2018-08-14 DIAGNOSIS — S0231XA Fracture of orbital floor, right side, initial encounter for closed fracture: Secondary | ICD-10-CM | POA: Diagnosis not present

## 2018-08-14 DIAGNOSIS — I10 Essential (primary) hypertension: Secondary | ICD-10-CM | POA: Diagnosis present

## 2018-08-14 DIAGNOSIS — S065X0A Traumatic subdural hemorrhage without loss of consciousness, initial encounter: Secondary | ICD-10-CM | POA: Diagnosis not present

## 2018-08-14 DIAGNOSIS — S0240AA Malar fracture, right side, initial encounter for closed fracture: Secondary | ICD-10-CM

## 2018-08-14 DIAGNOSIS — K219 Gastro-esophageal reflux disease without esophagitis: Secondary | ICD-10-CM | POA: Diagnosis present

## 2018-08-14 DIAGNOSIS — Z23 Encounter for immunization: Secondary | ICD-10-CM

## 2018-08-14 DIAGNOSIS — S01411A Laceration without foreign body of right cheek and temporomandibular area, initial encounter: Secondary | ICD-10-CM | POA: Diagnosis present

## 2018-08-14 DIAGNOSIS — W010XXA Fall on same level from slipping, tripping and stumbling without subsequent striking against object, initial encounter: Secondary | ICD-10-CM | POA: Diagnosis present

## 2018-08-14 DIAGNOSIS — Y92009 Unspecified place in unspecified non-institutional (private) residence as the place of occurrence of the external cause: Secondary | ICD-10-CM

## 2018-08-14 DIAGNOSIS — K7460 Unspecified cirrhosis of liver: Secondary | ICD-10-CM | POA: Diagnosis present

## 2018-08-14 DIAGNOSIS — S0990XA Unspecified injury of head, initial encounter: Secondary | ICD-10-CM

## 2018-08-14 DIAGNOSIS — Z86718 Personal history of other venous thrombosis and embolism: Secondary | ICD-10-CM

## 2018-08-14 DIAGNOSIS — F17210 Nicotine dependence, cigarettes, uncomplicated: Secondary | ICD-10-CM | POA: Diagnosis present

## 2018-08-14 DIAGNOSIS — S066X9A Traumatic subarachnoid hemorrhage with loss of consciousness of unspecified duration, initial encounter: Secondary | ICD-10-CM | POA: Diagnosis present

## 2018-08-14 DIAGNOSIS — S065XAA Traumatic subdural hemorrhage with loss of consciousness status unknown, initial encounter: Secondary | ICD-10-CM | POA: Diagnosis present

## 2018-08-14 DIAGNOSIS — M25562 Pain in left knee: Secondary | ICD-10-CM | POA: Diagnosis present

## 2018-08-14 DIAGNOSIS — F1020 Alcohol dependence, uncomplicated: Secondary | ICD-10-CM | POA: Diagnosis present

## 2018-08-14 DIAGNOSIS — Z8711 Personal history of peptic ulcer disease: Secondary | ICD-10-CM

## 2018-08-14 DIAGNOSIS — S065X9A Traumatic subdural hemorrhage with loss of consciousness of unspecified duration, initial encounter: Secondary | ICD-10-CM | POA: Diagnosis present

## 2018-08-14 LAB — BASIC METABOLIC PANEL
Anion gap: 13 (ref 5–15)
BUN: 7 mg/dL — AB (ref 8–23)
CHLORIDE: 98 mmol/L (ref 98–111)
CO2: 27 mmol/L (ref 22–32)
CREATININE: 1.17 mg/dL (ref 0.61–1.24)
Calcium: 9.2 mg/dL (ref 8.9–10.3)
GFR calc Af Amer: >60 mL/min (ref >60)
GFR calc non Af Amer: >60 mL/min (ref >60)
GLUCOSE: 122 mg/dL — AB (ref 70–99)
Potassium: 3.5 mmol/L (ref 3.5–5.1)
Sodium: 138 mmol/L (ref 135–145)

## 2018-08-14 LAB — CBC WITH DIFFERENTIAL/PLATELET
Abs Immature Granulocytes: 0.01 10*3/uL (ref 0.00–0.07)
BASOS ABS: 0 10*3/uL (ref 0.0–0.1)
Basophils Relative: 1 %
EOS PCT: 2 %
Eosinophils Absolute: 0.1 10*3/uL (ref 0.0–0.5)
HEMATOCRIT: 38.9 % — AB (ref 39.0–52.0)
HEMOGLOBIN: 13 g/dL (ref 13.0–17.0)
Immature Granulocytes: 0 %
LYMPHS ABS: 1.1 10*3/uL (ref 0.7–4.0)
LYMPHS PCT: 25 %
MCH: 30.9 pg (ref 26.0–34.0)
MCHC: 33.4 g/dL (ref 30.0–36.0)
MCV: 92.4 fL (ref 80.0–100.0)
MONOS PCT: 7 %
Monocytes Absolute: 0.3 10*3/uL (ref 0.1–1.0)
NRBC: 0 % (ref 0.0–0.2)
Neutro Abs: 2.9 10*3/uL (ref 1.7–7.7)
Neutrophils Relative %: 65 %
Platelets: 155 10*3/uL (ref 150–400)
RBC: 4.21 MIL/uL — ABNORMAL LOW (ref 4.22–5.81)
RDW: 12.7 % (ref 11.5–15.5)
WBC: 4.4 10*3/uL (ref 4.0–10.5)

## 2018-08-14 LAB — ETHANOL: Alcohol, Ethyl (B): 41 mg/dL — ABNORMAL HIGH (ref <10)

## 2018-08-14 LAB — MAGNESIUM: Magnesium: 2 mg/dL (ref 1.7–2.4)

## 2018-08-14 MED ORDER — SODIUM CHLORIDE 0.9 % IV BOLUS
1000.0000 mL | Freq: Once | INTRAVENOUS | Status: AC
  Administered 2018-08-14: 1000 mL via INTRAVENOUS

## 2018-08-14 MED ORDER — IOPAMIDOL (ISOVUE-370) INJECTION 76%
75.0000 mL | Freq: Once | INTRAVENOUS | Status: AC | PRN
  Administered 2018-08-14: 75 mL via INTRAVENOUS

## 2018-08-14 MED ORDER — LEVETIRACETAM 500 MG PO TABS
500.0000 mg | ORAL_TABLET | Freq: Two times a day (BID) | ORAL | Status: DC
  Administered 2018-08-14 – 2018-08-16 (×4): 500 mg via ORAL
  Filled 2018-08-14 (×4): qty 1

## 2018-08-14 MED ORDER — IOPAMIDOL (ISOVUE-370) INJECTION 76%
INTRAVENOUS | Status: AC
  Filled 2018-08-14: qty 100

## 2018-08-14 MED ORDER — DOCUSATE SODIUM 100 MG PO CAPS
100.0000 mg | ORAL_CAPSULE | Freq: Two times a day (BID) | ORAL | Status: DC
  Administered 2018-08-15 – 2018-08-16 (×3): 100 mg via ORAL
  Filled 2018-08-14 (×3): qty 1

## 2018-08-14 MED ORDER — MORPHINE SULFATE (PF) 2 MG/ML IV SOLN: 2.0000 mg | INTRAVENOUS | Status: DC | PRN

## 2018-08-14 MED ORDER — TETANUS-DIPHTH-ACELL PERTUSSIS 5-2.5-18.5 LF-MCG/0.5 IM SUSP
0.5000 mL | Freq: Once | INTRAMUSCULAR | Status: AC
Start: 2018-08-14 — End: 2018-08-14
  Administered 2018-08-14: 0.5 mL via INTRAMUSCULAR
  Filled 2018-08-14: qty 0.5

## 2018-08-14 MED ORDER — ACETAMINOPHEN 325 MG PO TABS
650.0000 mg | ORAL_TABLET | ORAL | Status: DC | PRN
  Administered 2018-08-14 – 2018-08-15 (×3): 650 mg via ORAL
  Filled 2018-08-14 (×3): qty 2

## 2018-08-14 MED ORDER — ACETAMINOPHEN 500 MG PO TABS
1000.0000 mg | ORAL_TABLET | Freq: Once | ORAL | Status: AC
  Administered 2018-08-14: 1000 mg via ORAL
  Filled 2018-08-14: qty 2

## 2018-08-14 NOTE — Progress Notes (Signed)
Unable to admit pt to unit d/t level of care. MD called and clarified to transfer pt to progressive care.

## 2018-08-14 NOTE — Consult Note (Signed)
Reason for Consult: Facial fractures, fall  HPI:  Alexander Schmitt is an 68 y.o. male who presents to the ER today following a fall. He was walking to the bathroom when he lost his balance and fell.  He struck his right shoulder and right cheek against the floor.  He had bleeding from the right cheek. His CT scan in the ER shows minimally displaced right tripod fractures.  Past Medical History:  Diagnosis Date  . Alcoholism ([place]) since before 2013  . Anemia 08/2015   PRBC x 2 units 08/2015.   . Arthritis    left shoulder   . Ascites 11/2005, 05/2012   05/2012:4 liter paracentesis, no SBP   . Cirrhosis ([place]) 2013  . Colon polyp 09/07/15   ascenging colon, minor/scattered diverticulosis on colonoscopy  . Diabetes mellitus without complication ([place])    PT. DENIES  . Effusion of right knee 03/2014  . GERD (gastroesophageal reflux disease)    occasional   . History of stomach ulcers   . Pancreatitis 07/6965   acute alcoholic and chronic; pseudocyst and pancreatic calcifications 11/2011  . SBO (small bowel obstruction) ([place]) 11/2005   medical mgt  . 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 COLONOSCOPY N/A 09/07/2015   Procedure: COLONOSCOPY;  Surgeon: Irene Shipper, MD;  Location: [facility] ENDOSCOPY;  Service: Endoscopy;  Laterality: N/A;  . COLONOSCOPY    . ESOPHAGOGASTRODUODENOSCOPY Left 09/02/2015   Procedure: ESOPHAGOGASTRODUODENOSCOPY (EGD);  Surgeon: Manus Gunning, MD;  Location: [facility];  Service: Gastroenterology;  Laterality: Left;  . EUS N/A 07/13/2013   Procedure: ESOPHAGEAL ENDOSCOPIC ULTRASOUND (EUS) RADIAL;  Surgeon: Arta Silence, MD;  Location: [facility] ENDOSCOPY;  Service: Endoscopy;  Laterality: N/A;  . INGUINAL HERNIA REPAIR Right 09/29/2012   Procedure: HERNIA REPAIR INGUINAL ADULT;  Surgeon: Imogene Burn. Georgette Dover, MD;  Location: [facility] ORS;  Service: General;  Laterality: Right;  .  INSERTION OF MESH Right 09/29/2012   Procedure: INSERTION OF MESH;  Surgeon: Imogene Burn. Georgette Dover, MD;  Location: [facility] ORS;  Service: General;  Laterality: Right;  . UMBILICAL HERNIA REPAIR N/A 09/29/2012   Procedure: HERNIA REPAIR UMBILICAL ADULT;  Surgeon: Imogene Burn. Georgette Dover, MD;  Location: [facility] ORS;  Service: General;  Laterality: N/A;    Family History  Problem Relation Age of Onset  . Hypertension Father   . Colon cancer Neg Hx     Social History:  reports that he has been smoking cigarettes. He has a 10.00 pack-year smoking history. He has never used smokeless tobacco. He reports current alcohol use. He reports that he does not use drugs.  Allergies: No Known Allergies  Prior to Admission medications   Medication Sig Start Date End Date Taking? Authorizing Provider  acetaminophen (TYLENOL) 325 MG tablet Take 650 mg by mouth every 6 (six) hours as needed (pain). Reported on 10/22/2015    [provider]  Multiple Vitamin (MULTIVITAMIN WITH MINERALS) TABS tablet Take 1 tablet by mouth daily. 09/07/15   Ghimire, Henreitta Leber, MD  omeprazole (PRILOSEC) 20 MG capsule TAKE 1 CAPSULE (20 MG TOTAL) BY MOUTH DAILY. 04/15/16   Armbruster, Carlota Raspberry, MD  pantoprazole (PROTONIX) 40 MG tablet Take 1 tablet (40 mg total) by mouth daily at 6 (six) AM. Patient not taking: Reported on 12/12/2015 09/07/15   Jonetta Osgood, MD    Medications:  I have reviewed the patient's current medications.  Scheduled: . docusate sodium  100 mg Oral BID  . iopamidol      . levETIRAcetam  500 mg Oral BID   Continuous:   Results for orders placed or performed during the hospital encounter of 08/14/18 (from the past 48 hour(s))  Ethanol     Status: Abnormal   Collection Time: 08/14/18  4:55 PM  Result Value Ref Range   Alcohol, Ethyl (B) 41 (H) <10 mg/dL    Comment: (NOTE) Lowest detectable limit for serum alcohol is 10 mg/dL. For medical purposes only. Performed at [facility], [place] [address]  [place], [place] [address]   Basic metabolic panel     Status: Abnormal   Collection Time: 08/14/18  4:55 PM  Result Value Ref Range   Sodium 138 135 - 145 mmol/L   Potassium 3.5 3.5 - 5.1 mmol/L   Chloride 98 98 - 111 mmol/L   CO2 27 22 - 32 mmol/L   Glucose, Bld 122 (H) 70 - 99 mg/dL   BUN 7 (L) 8 - 23 mg/dL   Creatinine, Ser 1.17 0.61 - 1.24 mg/dL   Calcium 9.2 8.9 - 10.3 mg/dL   GFR calc non Af Amer >60 >60 mL/min   GFR calc Af Amer >60 >60 mL/min   Anion gap 13 5 - 15    Comment: Performed at [facility] [address], [place], [place] [address]  CBC with Differential     Status: Abnormal   Collection Time: 08/14/18  4:55 PM  Result Value Ref Range   WBC 4.4 4.0 - 10.5 K/uL   RBC 4.21 (L) 4.22 - 5.81 MIL/uL   Hemoglobin 13.0 13.0 - 17.0 g/dL   HCT 38.9 (L) 39.0 - 52.0 %   MCV 92.4 80.0 - 100.0 fL   MCH 30.9 26.0 - 34.0 pg   MCHC 33.4 30.0 - 36.0 g/dL   RDW 12.7 11.5 - 15.5 %   Platelets 155 150 - 400 K/uL   nRBC 0.0 0.0 - 0.2 %   Neutrophils Relative % 65 %   Neutro Abs 2.9 1.7 - 7.7 K/uL   Lymphocytes Relative 25 %   Lymphs Abs 1.1 0.7 - 4.0 K/uL   Monocytes Relative 7 %   Monocytes Absolute 0.3 0.1 - 1.0 K/uL   Eosinophils Relative 2 %   Eosinophils Absolute 0.1 0.0 - 0.5 K/uL   Basophils Relative 1 %   Basophils Absolute 0.0 0.0 - 0.1 K/uL   Immature Granulocytes 0 %   Abs Immature Granulocytes 0.01 0.00 - 0.07 K/uL    Comment: Performed at [facility] [address], [place], [place] [address]  Magnesium     Status: None   Collection Time: 08/14/18  4:55 PM  Result Value Ref Range   Magnesium 2.0 1.7 - 2.4 mg/dL    Comment: Performed at [facility], [place] [address], [place],  [address]    Ct Angio Head W Or Wo Contrast  Result Date: 08/14/2018 CLINICAL DATA:  68 year old male status post syncope and fall sustaining right side tripod fracture, trace right subdural hematoma, but also a small volume of subarachnoid hemorrhage in the  right ambient cistern. EXAM: CT ANGIOGRAPHY HEAD TECHNIQUE: Multidetector CT imaging of the head was performed using the standard protocol during bolus administration of intravenous contrast. Multiplanar CT image reconstructions and MIPs were obtained to evaluate the vascular anatomy. CONTRAST:  18mL ISOVUE-370 IOPAMIDOL (ISOVUE-370) INJECTION 76% COMPARISON:  CT head face  and cervical spine 1857 hours today. FINDINGS: Posterior circulation: Fairly codominant distal vertebral arteries appear normal to the vertebrobasilar junction. The a ICAs appear dominant and their origins are patent. Mildly tortuous basilar artery with no stenosis. Normal basilar tip, SCA and PCA origins. Small right posterior communicating artery, the left is diminutive or absent. Left PCA branches are within normal limits. Right PCA branches traverse the right ambient cistern and also appear normal. Blood products are evident along the right P2 and P3 segments. Anterior circulation: Tortuous distal cervical ICAs below the skull base. Patent ICA siphons. Cavernous segment tortuosity more so on the right. Left ICA siphon mild calcified plaque without stenosis. Normal left ophthalmic artery origin. Right ICA siphon calcified plaque without stenosis. Right ophthalmic and posterior communicating artery origins are normal. Patent carotid termini. Normal MCA and ACA origins. The right A1 is dominant and the left diminutive. Anterior communicating artery and bilateral ACA branches are within normal limits. The right A2 remains dominant. Left MCA M1 segment is mildly tortuous. Left MCA bifurcation and left MCA branches are within normal limits. Right MCA M1 segment, bifurcation, and right MCA branches are within normal limits. Venous sinuses: Patent on the delayed images. Anatomic variants: Dominant right ACA. Delayed phase: Right ambient cistern subarachnoid hemorrhage redemonstrated, volume appears stable. No new basilar cistern hemorrhage. Trace  right side subdural hematoma appears stable on series 15, image 16. No intracranial mass effect. No abnormal enhancement identified. Stable gray-white matter differentiation. Review of the MIP images confirms the above findings IMPRESSION: 1. Intracranial CTA is negative for aneurysm. 2. Right ambient cistern Subarachnoid Hemorrhage is stable and most likely posttraumatic. 3. Stable trace right subdural hematoma. No intracranial mass effect. 4. ICA tortuosity and calcified atherosclerosis of the siphons without arterial stenosis. 5. Negative posterior circulation. Electronically Signed   By: Genevie Ann M.D.   On: 08/14/2018 21:53   Dg Shoulder Right  Result Date: 08/14/2018 CLINICAL DATA:  [place] at home 1 hour ago, lateral RIGHT shoulder pain EXAM: RIGHT SHOULDER - 2+ VIEW COMPARISON:  None FINDINGS: Osseous demineralization. AC joint alignment normal. Mild glenohumeral joint space narrowing. No acute fracture, dislocation, or bone destruction. Visualized RIGHT ribs appear intact. IMPRESSION: No acute abnormalities. Electronically Signed   By: Lavonia Dana M.D.   On: 08/14/2018 17:35   Ct Head Wo Contrast  Addendum Date: 08/14/2018   ADDENDUM REPORT: 08/14/2018 20:06 ADDENDUM: Study discussed by telephone with Dr. Dene Gentry on 08/14/2018 at [place] hours. Electronically Signed   By: Genevie Ann M.D.   On: 08/14/2018 20:06   Result Date: 08/14/2018 CLINICAL DATA:  68 year old male with syncopal episode walking to bathroom. Pale, diaphoretic. EXAM: CT HEAD WITHOUT CONTRAST CT MAXILLOFACIAL WITHOUT CONTRAST CT CERVICAL SPINE WITHOUT CONTRAST TECHNIQUE: Multidetector CT imaging of the head, cervical spine, and maxillofacial structures were performed using the standard protocol without intravenous contrast. Multiplanar CT image reconstructions of the cervical spine and maxillofacial structures were also generated. COMPARISON:  Head CT without contrast 01/23/2018. FINDINGS: CT HEAD FINDINGS Brain: There is a small volume of  subarachnoid hemorrhage in the right ambient cistern which was normal in 2019 (series 3, images 12 and 13). However, the other basilar cisterns are spared, no other subarachnoid hemorrhage is identified. No intraventricular hemorrhage. However, there is also trace right lateral subdural hematoma measuring 3-4 millimeters in thickness on coronal image 28. Underlying calvarium appears stable and intact. No other intracranial hemorrhage identified. No intracranial mass effect. No ventriculomegaly. Stable gray-white matter differentiation throughout the brain.  No cortically based acute infarct identified. Vascular: Calcified atherosclerosis at the skull base. Skull: Calvarium appears stable and intact. Other: No scalp hematoma identified. CT MAXILLOFACIAL FINDINGS Osseous: Mandible intact. Relatively poor dentition. Tripod fracture on the right with comminution of the anterior right maxillary wall, posterior right maxillary wall (series 8, image 55), posterior right zygomatic arch (image 52) and nondisplaced fracture of the right orbital floor. No definite nasal bone fracture. The left maxilla and zygoma are intact. Central skull base appears intact. Orbits: Nondisplaced fracture of the right orbital floor. Minimally displaced fracture of the right lateral orbital wall (series 8, image 63). Small volume extraconal gas within the right orbit. Mild right proptosis. No intraorbital hematoma. The right globe appears intact. The left orbital walls and soft tissues appear normal. Sinuses: Layering hemorrhage in the right maxillary sinus. Blood and/or mucosal thickening scattered in the right ethmoids, mildly involving the sphenoid sinuses and the left frontal sinus. Mild mucosal thickening in the left maxillary sinus. Tympanic cavities and mastoids are clear. Soft tissues: Negative visible noncontrast larynx, pharynx, parapharyngeal spaces, retropharyngeal space, sublingual space, submandibular and parotid spaces. Right side  superficial face soft tissue hematoma/contusion with mild soft tissue gas overlying the tripod fracture. CT CERVICAL SPINE FINDINGS Alignment: Straightening of cervical lordosis. Cervicothoracic junction alignment is within normal limits. Bilateral posterior element alignment is within normal limits. Skull base and vertebrae: Visualized skull base is intact. No atlanto-occipital dissociation. Osteopenia. No cervical spine fracture identified. Soft tissues and spinal canal: No prevertebral fluid or swelling. No visible canal hematoma. Negative noncontrast neck soft tissues aside from calcified carotid atherosclerosis. Disc levels: Advanced lower cervical disc and endplate degeneration U7-M5 through C7-T1. Mild associated spinal stenosis suspected. Areas of vacuum disc and vacuum endplate geodes. Upper chest: Visible upper thoracic levels appear intact. Mild apical lung scarring. Negative noncontrast thoracic inlet. IMPRESSION: BRAIN: 1. Positive for small volume subarachnoid hemorrhage in the right ambient cistern. Burtis Junes this is posttraumatic (see #2), although CTA Head is suggested to exclude the possibility of intracranial aneurysm. 2. Trace right lateral Subdural Hematoma also. No underlying skull fracture. 3. No intracranial mass effect. FACE: 1. Tripod Fracture on the right. Comminuted right maxillary sinus walls and posterior right zygomatic arch. More simple and mildly displaced fractures of the right orbital floor and right lateral orbital wall. 2. Right side intraorbital gas with mild proptosis. But no intraorbital hematoma. 3. No other facial fracture. Hemorrhage and/or mucosal thickening in the paranasal sinuses. CERVICAL SPINE: 1.  No acute osseous abnormality identified. 2. Advanced lower cervical spine degeneration with mild spinal stenosis. Electronically Signed: By: Genevie Ann M.D. On: 08/14/2018 19:52   Ct Cervical Spine Wo Contrast  Addendum Date: 08/14/2018   ADDENDUM REPORT: 08/14/2018 20:06  ADDENDUM: Study discussed by telephone with Dr. Dene Gentry on 08/14/2018 at [place] hours. Electronically Signed   By: Genevie Ann M.D.   On: 08/14/2018 20:06   Result Date: 08/14/2018 CLINICAL DATA:  68 year old male with syncopal episode walking to bathroom. Pale, diaphoretic. EXAM: CT HEAD WITHOUT CONTRAST CT MAXILLOFACIAL WITHOUT CONTRAST CT CERVICAL SPINE WITHOUT CONTRAST TECHNIQUE: Multidetector CT imaging of the head, cervical spine, and maxillofacial structures were performed using the standard protocol without intravenous contrast. Multiplanar CT image reconstructions of the cervical spine and maxillofacial structures were also generated. COMPARISON:  Head CT without contrast 01/23/2018. FINDINGS: CT HEAD FINDINGS Brain: There is a small volume of subarachnoid hemorrhage in the right ambient cistern which was normal in 2019 (series 3, images 12 and 13).  However, the other basilar cisterns are spared, no other subarachnoid hemorrhage is identified. No intraventricular hemorrhage. However, there is also trace right lateral subdural hematoma measuring 3-4 millimeters in thickness on coronal image 28. Underlying calvarium appears stable and intact. No other intracranial hemorrhage identified. No intracranial mass effect. No ventriculomegaly. Stable gray-white matter differentiation throughout the brain. No cortically based acute infarct identified. Vascular: Calcified atherosclerosis at the skull base. Skull: Calvarium appears stable and intact. Other: No scalp hematoma identified. CT MAXILLOFACIAL FINDINGS Osseous: Mandible intact. Relatively poor dentition. Tripod fracture on the right with comminution of the anterior right maxillary wall, posterior right maxillary wall (series 8, image 55), posterior right zygomatic arch (image 52) and nondisplaced fracture of the right orbital floor. No definite nasal bone fracture. The left maxilla and zygoma are intact. Central skull base appears intact. Orbits: Nondisplaced  fracture of the right orbital floor. Minimally displaced fracture of the right lateral orbital wall (series 8, image 63). Small volume extraconal gas within the right orbit. Mild right proptosis. No intraorbital hematoma. The right globe appears intact. The left orbital walls and soft tissues appear normal. Sinuses: Layering hemorrhage in the right maxillary sinus. Blood and/or mucosal thickening scattered in the right ethmoids, mildly involving the sphenoid sinuses and the left frontal sinus. Mild mucosal thickening in the left maxillary sinus. Tympanic cavities and mastoids are clear. Soft tissues: Negative visible noncontrast larynx, pharynx, parapharyngeal spaces, retropharyngeal space, sublingual space, submandibular and parotid spaces. Right side superficial face soft tissue hematoma/contusion with mild soft tissue gas overlying the tripod fracture. CT CERVICAL SPINE FINDINGS Alignment: Straightening of cervical lordosis. Cervicothoracic junction alignment is within normal limits. Bilateral posterior element alignment is within normal limits. Skull base and vertebrae: Visualized skull base is intact. No atlanto-occipital dissociation. Osteopenia. No cervical spine fracture identified. Soft tissues and spinal canal: No prevertebral fluid or swelling. No visible canal hematoma. Negative noncontrast neck soft tissues aside from calcified carotid atherosclerosis. Disc levels: Advanced lower cervical disc and endplate degeneration B1-Y7 through C7-T1. Mild associated spinal stenosis suspected. Areas of vacuum disc and vacuum endplate geodes. Upper chest: Visible upper thoracic levels appear intact. Mild apical lung scarring. Negative noncontrast thoracic inlet. IMPRESSION: BRAIN: 1. Positive for small volume subarachnoid hemorrhage in the right ambient cistern. Burtis Junes this is posttraumatic (see #2), although CTA Head is suggested to exclude the possibility of intracranial aneurysm. 2. Trace right lateral Subdural  Hematoma also. No underlying skull fracture. 3. No intracranial mass effect. FACE: 1. Tripod Fracture on the right. Comminuted right maxillary sinus walls and posterior right zygomatic arch. More simple and mildly displaced fractures of the right orbital floor and right lateral orbital wall. 2. Right side intraorbital gas with mild proptosis. But no intraorbital hematoma. 3. No other facial fracture. Hemorrhage and/or mucosal thickening in the paranasal sinuses. CERVICAL SPINE: 1.  No acute osseous abnormality identified. 2. Advanced lower cervical spine degeneration with mild spinal stenosis. Electronically Signed: By: Genevie Ann M.D. On: 08/14/2018 19:52   Dg Chest Port 1 View  Result Date: 08/14/2018 CLINICAL DATA:  [place] at home 1 hour ago, congestion and productive cough for 1-2 weeks, RIGHT lateral shoulder pain, hypertension, smoker, history of pancreatitis, cirrhosis, diabetes mellitus EXAM: PORTABLE CHEST 1 VIEW COMPARISON:  Portable exam 1659 hours compared to 09/02/2015 FINDINGS: Normal heart size, mediastinal contours, and pulmonary vascularity. Probable emphysematous changes without infiltrate, pleural effusion, or pneumothorax. Bones demineralized. IMPRESSION: Probable emphysematous changes without infiltrate. Electronically Signed   By: Crist Infante.D.  On: 08/14/2018 17:34   Ct Maxillofacial Wo Cm  Addendum Date: 08/14/2018   ADDENDUM REPORT: 08/14/2018 20:06 ADDENDUM: Study discussed by telephone with Dr. Dene Gentry on 08/14/2018 at [place] hours. Electronically Signed   By: Genevie Ann M.D.   On: 08/14/2018 20:06   Result Date: 08/14/2018 CLINICAL DATA:  68 year old male with syncopal episode walking to bathroom. Pale, diaphoretic. EXAM: CT HEAD WITHOUT CONTRAST CT MAXILLOFACIAL WITHOUT CONTRAST CT CERVICAL SPINE WITHOUT CONTRAST TECHNIQUE: Multidetector CT imaging of the head, cervical spine, and maxillofacial structures were performed using the standard protocol without intravenous contrast.  Multiplanar CT image reconstructions of the cervical spine and maxillofacial structures were also generated. COMPARISON:  Head CT without contrast 01/23/2018. FINDINGS: CT HEAD FINDINGS Brain: There is a small volume of subarachnoid hemorrhage in the right ambient cistern which was normal in 2019 (series 3, images 12 and 13). However, the other basilar cisterns are spared, no other subarachnoid hemorrhage is identified. No intraventricular hemorrhage. However, there is also trace right lateral subdural hematoma measuring 3-4 millimeters in thickness on coronal image 28. Underlying calvarium appears stable and intact. No other intracranial hemorrhage identified. No intracranial mass effect. No ventriculomegaly. Stable gray-white matter differentiation throughout the brain. No cortically based acute infarct identified. Vascular: Calcified atherosclerosis at the skull base. Skull: Calvarium appears stable and intact. Other: No scalp hematoma identified. CT MAXILLOFACIAL FINDINGS Osseous: Mandible intact. Relatively poor dentition. Tripod fracture on the right with comminution of the anterior right maxillary wall, posterior right maxillary wall (series 8, image 55), posterior right zygomatic arch (image 52) and nondisplaced fracture of the right orbital floor. No definite nasal bone fracture. The left maxilla and zygoma are intact. Central skull base appears intact. Orbits: Nondisplaced fracture of the right orbital floor. Minimally displaced fracture of the right lateral orbital wall (series 8, image 63). Small volume extraconal gas within the right orbit. Mild right proptosis. No intraorbital hematoma. The right globe appears intact. The left orbital walls and soft tissues appear normal. Sinuses: Layering hemorrhage in the right maxillary sinus. Blood and/or mucosal thickening scattered in the right ethmoids, mildly involving the sphenoid sinuses and the left frontal sinus. Mild mucosal thickening in the left  maxillary sinus. Tympanic cavities and mastoids are clear. Soft tissues: Negative visible noncontrast larynx, pharynx, parapharyngeal spaces, retropharyngeal space, sublingual space, submandibular and parotid spaces. Right side superficial face soft tissue hematoma/contusion with mild soft tissue gas overlying the tripod fracture. CT CERVICAL SPINE FINDINGS Alignment: Straightening of cervical lordosis. Cervicothoracic junction alignment is within normal limits. Bilateral posterior element alignment is within normal limits. Skull base and vertebrae: Visualized skull base is intact. No atlanto-occipital dissociation. Osteopenia. No cervical spine fracture identified. Soft tissues and spinal canal: No prevertebral fluid or swelling. No visible canal hematoma. Negative noncontrast neck soft tissues aside from calcified carotid atherosclerosis. Disc levels: Advanced lower cervical disc and endplate degeneration G4-W1 through C7-T1. Mild associated spinal stenosis suspected. Areas of vacuum disc and vacuum endplate geodes. Upper chest: Visible upper thoracic levels appear intact. Mild apical lung scarring. Negative noncontrast thoracic inlet. IMPRESSION: BRAIN: 1. Positive for small volume subarachnoid hemorrhage in the right ambient cistern. Burtis Junes this is posttraumatic (see #2), although CTA Head is suggested to exclude the possibility of intracranial aneurysm. 2. Trace right lateral Subdural Hematoma also. No underlying skull fracture. 3. No intracranial mass effect. FACE: 1. Tripod Fracture on the right. Comminuted right maxillary sinus walls and posterior right zygomatic arch. More simple and mildly displaced fractures of the right orbital  floor and right lateral orbital wall. 2. Right side intraorbital gas with mild proptosis. But no intraorbital hematoma. 3. No other facial fracture. Hemorrhage and/or mucosal thickening in the paranasal sinuses. CERVICAL SPINE: 1.  No acute osseous abnormality identified. 2.  Advanced lower cervical spine degeneration with mild spinal stenosis. Electronically Signed: By: Genevie Ann M.D. On: 08/14/2018 19:52   Review of Systems  HENT: Negative for sore throat.   Respiratory: Negative for cough and shortness of breath.   Gastrointestinal: Negative for vomiting.  Neurological: Negative for headaches.  All other systems reviewed and are negative.  Blood pressure 133/67, pulse 80, temperature 97.7 F (36.5 C), temperature source Oral, resp. rate 18, height 5\' 10"  (1.778 m), weight 65.8 kg, SpO2 96 %. Physical Exam Vitals signs and nursing note reviewed.  General: He is not in acute distress. Eyes: His pupils are equal, round, reactive to light. Extraocular motion is intact.  Ears: Examination of the ears shows normal auricles and external auditory canals bilaterally. Both tympanic membranes are intact.  Nose: Nasal examination shows normal mucosa, septum, turbinates.  Face: Facial examination shows no asymmetry. No significant bony stepoff. Small 0.5 cm laceration to the right cheek  Mouth: Small 0.25 cm laceration to the inner aspect of the right lower lip.  No significant trismus is noted.  Neck Palpation of the neck reveals no lymphadenopathy or mass. The trachea is midline. The thyroid is not significantly enlarged.  Neuro: Cranial nerves 2-12 are all grossly in tact. CV: Normal rate and regular rhythm.  Chest: Pulmonary effort is normal. No respiratory distress.  Musculoskeletal: Normal range of motion. No deformity.  Mental Status: He is alert and oriented to person, place, and time. Mental status is at baseline.    Assessment/Plan: Minimally displaced right tripod fractures. Pt has no entrapment, diplopia, or trismus.  Likely will not need surgical intervention. He may follow up with me as an outpatient approximately 1 week after discharge.  Barbi Kumagai W Jakori Burkett 08/14/2018, 11:13 PM

## 2018-08-14 NOTE — ED Triage Notes (Signed)
[facility] reports pt from home, with near syncopal episode walking to bathroom. Pale and diaphoretic upon arrival, with small laceration on right side of face. BP 116/74 supine and 73/29 standing. Given 350 NS  Last bp 138/85, hr 70, 97%RA, cbg 143

## 2018-08-14 NOTE — ED Provider Notes (Signed)
[facility] EMERGENCY DEPARTMENT Provider Note   CSN: [id number] Arrival date & time: 08/14/18  1630     History   Chief Complaint Chief Complaint  Patient presents with  . Near Syncope    HPI Alexander Schmitt is a 68 y.o. male.  68 year old male with prior medical history as described below presents for evaluation following fall.  Patient reports that he was at home.  He reports that he is consumed approximately 1 bottle of wine today.  This is a normal rate of consumption for him.  As he was walking to the bathroom he lost his balance and fell.  He struck his right shoulder and right cheek against the floor.  He does have bleeding from the right cheek.  He also complains of a small laceration to the inner aspect of the right lower lip.  He denies complete LOC.  He denies neck pain.  He denies other significant injury.  He is unsure of his last tetanus.  The history is provided by the patient and medical records.  Illness  Location:  Fall, facial laceration, post head injury Severity:  Mild Onset quality:  Sudden Duration:  1 hour Timing:  Rare Progression:  Unchanged Chronicity:  New Associated symptoms: no cough, no headaches, no shortness of breath, no sore throat and no vomiting     Past Medical History:  Diagnosis Date  . Alcoholism ([place]) since before 2013  . Anemia 08/2015   PRBC x 2 units 08/2015.   . Arthritis    left shoulder   . Ascites 11/2005, 05/2012   05/2012:4 liter paracentesis, no SBP   . Cirrhosis ([place]) 2013  . Colon polyp 09/07/15   ascenging colon, minor/scattered diverticulosis on colonoscopy  . Diabetes mellitus without complication ([place])    PT. DENIES  . Effusion of right knee 03/2014  . GERD (gastroesophageal reflux disease)    occasional   . History of stomach ulcers   . Pancreatitis 10/9700   acute alcoholic and chronic; pseudocyst and pancreatic calcifications 11/2011  . SBO (small bowel obstruction) ([place]) 11/2005   medical  mgt  . 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 Upper GI bleed 08/2015   suspected gastric varices;     Patient Active Problem List   Diagnosis Date Noted  . Benign neoplasm of ascending colon   . Abdominal discomfort   . Melena   . Splenic vein thrombosis   . Asplenia   . Abnormal CT of the abdomen   . Gastric varices   . Pancreatic mass   . Acute blood loss anemia   . UGI bleed 09/02/2015  . Alcohol abuse 09/02/2015  . Abdominal pain in male   . Protein-calorie malnutrition, severe ([place]) 02/04/2014  . Alcoholic pancreatitis 63/78/5885  . Malnutrition of moderate degree ([place]) 05/04/2013  . Pancreatic pseudocyst 05/04/2013  . Alcohol abuse, in remission 05/03/2013  . Right inguinal hernia 09/20/2012  . Umbilical hernia 02/77/4128  . Ascites 05/24/2012  . Cirrhosis ([place]) 05/24/2012  . 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 COLONOSCOPY N/A 09/07/2015   Procedure: COLONOSCOPY;  Surgeon: Irene Shipper, MD;  Location: [facility] ENDOSCOPY;  Service: Endoscopy;  Laterality: N/A;  . COLONOSCOPY    . ESOPHAGOGASTRODUODENOSCOPY Left 09/02/2015   Procedure: ESOPHAGOGASTRODUODENOSCOPY (EGD);  Surgeon: Manus Gunning, MD;  Location: [facility];  Service: Gastroenterology;  Laterality: Left;  . EUS N/A 07/13/2013   Procedure: ESOPHAGEAL ENDOSCOPIC ULTRASOUND (EUS)  RADIAL;  Surgeon: Arta Silence, MD;  Location: [facility] ENDOSCOPY;  Service: Endoscopy;  Laterality: N/A;  . INGUINAL HERNIA REPAIR Right 09/29/2012   Procedure: HERNIA REPAIR INGUINAL ADULT;  Surgeon: Imogene Burn. Georgette Dover, MD;  Location: [facility] ORS;  Service: General;  Laterality: Right;  . INSERTION OF MESH Right 09/29/2012   Procedure: INSERTION OF MESH;  Surgeon: Imogene Burn. Georgette Dover, MD;  Location: [facility] ORS;  Service: General;  Laterality: Right;  . UMBILICAL HERNIA REPAIR N/A 09/29/2012   Procedure: HERNIA REPAIR UMBILICAL ADULT;  Surgeon: Imogene Burn. Georgette Dover, MD;  Location: [facility] ORS;   Service: General;  Laterality: N/A;        Home Medications    Prior to Admission medications   Medication Sig Start Date End Date Taking? Authorizing Provider  acetaminophen (TYLENOL) 325 MG tablet Take 650 mg by mouth every 6 (six) hours as needed (pain). Reported on 10/22/2015    [provider]  Multiple Vitamin (MULTIVITAMIN WITH MINERALS) TABS tablet Take 1 tablet by mouth daily. 09/07/15   Ghimire, Henreitta Leber, MD  omeprazole (PRILOSEC) 20 MG capsule TAKE 1 CAPSULE (20 MG TOTAL) BY MOUTH DAILY. 04/15/16   Armbruster, Carlota Raspberry, MD  pantoprazole (PROTONIX) 40 MG tablet Take 1 tablet (40 mg total) by mouth daily at 6 (six) AM. Patient not taking: Reported on 12/12/2015 09/07/15   Jonetta Osgood, MD    Family History Family History  Problem Relation Age of Onset  . Hypertension Father   . Colon cancer Neg Hx     Social History Social History   Tobacco Use  . Smoking status: Light Tobacco Smoker    Packs/day: 0.25    Years: 40.00    Pack years: 10.00    Types: Cigarettes  . Smokeless tobacco: Never Used  . Tobacco comment: 2-3 cigarettes per day  Substance Use Topics  . Alcohol use: Yes    Alcohol/week: 0.0 standard drinks  . Drug use: No     Allergies   Patient has no known allergies.   Review of Systems Review of Systems  HENT: Negative for sore throat.   Respiratory: Negative for cough and shortness of breath.   Gastrointestinal: Negative for vomiting.  Neurological: Negative for headaches.  All other systems reviewed and are negative.    Physical Exam Updated Vital Signs BP 139/78   Pulse 67   Temp 97.7 F (36.5 C) (Oral)   Resp 20   Ht 5\' 10"  (1.778 m)   Wt 65.8 kg   SpO2 98%   BMI 20.81 kg/m   Physical Exam Vitals signs and nursing note reviewed.  Constitutional:      General: He is not in acute distress.    Appearance: Normal appearance. He is well-developed.  HENT:     Head: Normocephalic.     Comments: Small 0.5 cm laceration  to the right cheek     Mouth/Throat:     Comments: Small 0.25 cm laceration to the inner aspect of the right lower lip  Eyes:     Conjunctiva/sclera: Conjunctivae normal.     Pupils: Pupils are equal, round, and reactive to light.  Neck:     Musculoskeletal: Normal range of motion and neck supple.  Cardiovascular:     Rate and Rhythm: Normal rate and regular rhythm.     Heart sounds: Normal heart sounds.  Pulmonary:     Effort: Pulmonary effort is normal. No respiratory distress.     Breath sounds: Normal breath sounds.  Abdominal:  General: There is no distension.     Palpations: Abdomen is soft.     Tenderness: There is no abdominal tenderness.  Musculoskeletal: Normal range of motion.        General: No deformity.  Skin:    General: Skin is warm and dry.  Neurological:     General: No focal deficit present.     Mental Status: He is alert and oriented to person, place, and time. Mental status is at baseline.      ED Treatments / Results  Labs (all labs ordered are listed, but only abnormal results are displayed) Labs Reviewed  ETHANOL - Abnormal; Notable for the following components:      Result Value   Alcohol, Ethyl (B) 41 (*)    All other components within normal limits  BASIC METABOLIC PANEL - Abnormal; Notable for the following components:   Glucose, Bld 122 (*)    BUN 7 (*)    All other components within normal limits  CBC WITH DIFFERENTIAL/PLATELET - Abnormal; Notable for the following components:   RBC 4.21 (*)    HCT 38.9 (*)    All other components within normal limits  MAGNESIUM    EKG EKG Interpretation  Date/Time:  Saturday August 14 2018 16:32:10 EST Ventricular Rate:  73 PR Interval:    QRS Duration: 93 QT Interval:  397 QTC Calculation: 438 R Axis:   53 Text Interpretation:  Sinus rhythm Probable anteroseptal infarct, old Confirmed by Dene Gentry [phone]) on 08/14/2018 4:36:14 PM   Radiology Dg Shoulder Right  Result Date:  08/14/2018 CLINICAL DATA:  [place] at home 1 hour ago, lateral RIGHT shoulder pain EXAM: RIGHT SHOULDER - 2+ VIEW COMPARISON:  None FINDINGS: Osseous demineralization. AC joint alignment normal. Mild glenohumeral joint space narrowing. No acute fracture, dislocation, or bone destruction. Visualized RIGHT ribs appear intact. IMPRESSION: No acute abnormalities. Electronically Signed   By: Lavonia Dana M.D.   On: 08/14/2018 17:35   Dg Chest Port 1 View  Result Date: 08/14/2018 CLINICAL DATA:  [place] at home 1 hour ago, congestion and productive cough for 1-2 weeks, RIGHT lateral shoulder pain, hypertension, smoker, history of pancreatitis, cirrhosis, diabetes mellitus EXAM: PORTABLE CHEST 1 VIEW COMPARISON:  Portable exam 1659 hours compared to 09/02/2015 FINDINGS: Normal heart size, mediastinal contours, and pulmonary vascularity. Probable emphysematous changes without infiltrate, pleural effusion, or pneumothorax. Bones demineralized. IMPRESSION: Probable emphysematous changes without infiltrate. Electronically Signed   By: Lavonia Dana M.D.   On: 08/14/2018 17:34    Procedures .Marland KitchenLaceration Repair Date/Time: 08/14/2018 5:51 PM Performed by: Valarie Merino, MD Authorized by: Valarie Merino, MD   Consent:    Consent obtained:  Verbal   Consent given by:  Patient   Risks discussed:  Infection, poor cosmetic result, pain, need for additional repair, tendon damage and retained foreign body   Alternatives discussed:  No treatment Anesthesia (see MAR for exact dosages):    Anesthesia method:  None Laceration details:    Location:  Face   Face location:  R cheek   Length (cm):  0.5 Repair type:    Repair type:  Simple Pre-procedure details:    Preparation:  Patient was prepped and draped in usual sterile fashion Exploration:    Contaminated: no   Treatment:    Area cleansed with:  Saline   Amount of cleaning:  Standard Skin repair:    Repair method:  Tissue adhesive Approximation:     Approximation:  Close Post-procedure details:  Patient tolerance of procedure:  Tolerated well, no immediate complications   (including critical care time)  Medications Ordered in ED Medications  sodium chloride 0.9 % bolus 1,000 mL (1,000 mLs Intravenous New Bag/Given 08/14/18 1713)  Tdap (BOOSTRIX) injection 0.5 mL (0.5 mLs Intramuscular Given 08/14/18 1713)     Initial Impression / Assessment and Plan / ED Course  I have reviewed the triage vital signs and the nursing notes.  Pertinent labs & imaging results that were available during my care of the patient were reviewed by me and considered in my medical decision making (see chart for details).     MDM  Screen complete   Patient is presenting after fall from standing at home.  Patient with small laceration to the right cheek that was closed with Dermabond.  CT imaging reveals evidence of small subarachnoid and small subdural bleed.  Neurosurgery is aware of case and will evaluate.  CT imaging also demonstrates right orbital floor fracture.  ENT consulted.  Trauma surgery is aware case will evaluate for admission.  Final Clinical Impressions(s) / ED Diagnoses   Final diagnoses:  Closed head injury, initial encounter  Closed fracture of right orbital floor, initial encounter [facility])  Intracranial bleed [facility])    ED Discharge Orders    None       Valarie Merino, MD 08/14/18 2110

## 2018-08-14 NOTE — ED Notes (Signed)
Patient transported to CT 

## 2018-08-14 NOTE — Consult Note (Signed)
Chief Complaint   Chief Complaint  Patient presents with  . Near Syncope    HPI   Consult requested by: Dr Francia Greaves Reason for consult: SDH, SAH  HPI: NAVDEEP HALT is a 68 y.o. male who presented to ER after a ?syncopal episode. He describes getting out of the bathroom and "blacking out". Woke up on the ground after presumed several seconds. Hit head/right side of face. Work up significant for multiple injuries including maxillofacial tripod fracture, SDH and SAH. NSY consultation was requested. Complains of left periorbital pain. Denies dizziness, changes in vision, N/V, N/T, focal deficit. He admits to drinking approx 1 pint of wine per day. Not on blood thinning medications.  Patient Active Problem List   Diagnosis Date Noted  . Benign neoplasm of ascending colon   . Abdominal discomfort   . Melena   . Splenic vein thrombosis   . Asplenia   . Abnormal CT of the abdomen   . Gastric varices   . Pancreatic mass   . Acute blood loss anemia   . UGI bleed 09/02/2015  . Alcohol abuse 09/02/2015  . Abdominal pain in male   . Protein-calorie malnutrition, severe ([place]) 02/04/2014  . Alcoholic pancreatitis 16/04/9603  . Malnutrition of moderate degree ([place]) 05/04/2013  . Pancreatic pseudocyst 05/04/2013  . Alcohol abuse, in remission 05/03/2013  . Right inguinal hernia 09/20/2012  . Umbilical hernia 54/03/8118  . Ascites 05/24/2012  . Cirrhosis ([place]) 05/24/2012  . 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 Alcoholism ([place]) since before 2013  . Anemia 08/2015   PRBC x 2 units 08/2015.   . Arthritis    left shoulder   . Ascites 11/2005, 05/2012   05/2012:4 liter paracentesis, no SBP   . Cirrhosis ([place]) 2013  . Colon polyp 09/07/15   ascenging colon, minor/scattered diverticulosis on colonoscopy  . Diabetes mellitus without complication ([place])    PT. DENIES  . Effusion of right knee 03/2014  . GERD (gastroesophageal  reflux disease)    occasional   . History of stomach ulcers   . Pancreatitis 07/4780   acute alcoholic and chronic; pseudocyst and pancreatic calcifications 11/2011  . SBO (small bowel obstruction) ([place]) 11/2005   medical mgt  . 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 Upper GI bleed 08/2015   suspected gastric varices;     PSH: Past Surgical History:  Procedure Laterality Date  . COLONOSCOPY N/A 09/07/2015   Procedure: COLONOSCOPY;  Surgeon: Irene Shipper, MD;  Location: [facility] ENDOSCOPY;  Service: Endoscopy;  Laterality: N/A;  . COLONOSCOPY    . ESOPHAGOGASTRODUODENOSCOPY Left 09/02/2015   Procedure: ESOPHAGOGASTRODUODENOSCOPY (EGD);  Surgeon: Manus Gunning, MD;  Location: [facility];  Service: Gastroenterology;  Laterality: Left;  . EUS N/A 07/13/2013   Procedure: ESOPHAGEAL ENDOSCOPIC ULTRASOUND (EUS) RADIAL;  Surgeon: Arta Silence, MD;  Location: [facility] ENDOSCOPY;  Service: Endoscopy;  Laterality: N/A;  . INGUINAL HERNIA REPAIR Right 09/29/2012   Procedure: HERNIA REPAIR INGUINAL ADULT;  Surgeon: Imogene Burn. Georgette Dover, MD;  Location: [facility] ORS;  Service: General;  Laterality: Right;  . INSERTION OF MESH Right 09/29/2012   Procedure: INSERTION OF MESH;  Surgeon: Imogene Burn. Georgette Dover, MD;  Location: [facility] ORS;  Service: General;  Laterality: Right;  . UMBILICAL HERNIA REPAIR N/A 09/29/2012   Procedure: HERNIA REPAIR UMBILICAL ADULT;  Surgeon: Imogene Burn. Georgette Dover, MD;  Location: [facility] ORS;  Service: General;  Laterality: N/A;    (Not in a  hospital admission)   SH: Social History   Tobacco Use  . Smoking status: Light Tobacco Smoker    Packs/day: 0.25    Years: 40.00    Pack years: 10.00    Types: Cigarettes  . Smokeless tobacco: Never Used  . Tobacco comment: 2-3 cigarettes per day  Substance Use Topics  . Alcohol use: Yes    Alcohol/week: 0.0 standard drinks  . Drug use: No    MEDS: Prior to Admission medications   Medication Sig Start Date End Date Taking? Authorizing Provider  acetaminophen  (TYLENOL) 325 MG tablet Take 650 mg by mouth every 6 (six) hours as needed (pain). Reported on 10/22/2015    [provider]  Multiple Vitamin (MULTIVITAMIN WITH MINERALS) TABS tablet Take 1 tablet by mouth daily. 09/07/15   Ghimire, Henreitta Leber, MD  omeprazole (PRILOSEC) 20 MG capsule TAKE 1 CAPSULE (20 MG TOTAL) BY MOUTH DAILY. 04/15/16   Armbruster, Carlota Raspberry, MD  pantoprazole (PROTONIX) 40 MG tablet Take 1 tablet (40 mg total) by mouth daily at 6 (six) AM. Patient not taking: Reported on 12/12/2015 09/07/15   Jonetta Osgood, MD    ALLERGY: No Known Allergies  Social History   Tobacco Use  . Smoking status: Light Tobacco Smoker    Packs/day: 0.25    Years: 40.00    Pack years: 10.00    Types: Cigarettes  . Smokeless tobacco: Never Used  . Tobacco comment: 2-3 cigarettes per day  Substance Use Topics  . Alcohol use: Yes    Alcohol/week: 0.0 standard drinks     Family History  Problem Relation Age of Onset  . Hypertension Father   . Colon cancer Neg Hx      ROS   Review of Systems  Constitutional: Negative.   HENT: Negative.   Eyes: Negative for blurred vision, double vision and photophobia.  Cardiovascular: Negative.   Gastrointestinal: Negative for nausea and vomiting.  Genitourinary: Negative.   Musculoskeletal: Positive for falls and myalgias. Negative for neck pain.  Skin: Negative.   Neurological: Positive for loss of consciousness and headaches (right periorbital). Negative for dizziness, tingling, tremors, sensory change, speech change, focal weakness, seizures and weakness.    Exam   Vitals:   08/14/18 2000 08/14/18 2015  BP: (!) 157/76 (!) 145/80  Pulse: 76 78  Resp: 16 20  Temp:    SpO2: 97% 96%   General appearance: WDWN, NAD, bruising noted to right side of face, mild right periorbital swelling Eyes: No scleral injection Cardiovascular: Regular rate and rhythm without murmurs, rubs, gallops. No edema or variciosities. Distal pulses  normal. Pulmonary: Effort normal, non-labored breathing Musculoskeletal:     Muscle tone upper extremities: Normal    Muscle tone lower extremities: Normal    Motor exam: Upper Extremities Deltoid Bicep Tricep Grip  Right 5/5 5/5 5/5 5/5  Left 5/5 5/5 5/5 5/5   Lower Extremity IP Quad PF DF EHL  Right 5/5 5/5 5/5 5/5 5/5  Left 5/5 5/5 5/5 5/5 5/5   Neurological Mental Status:    - Patient is awake, alert, oriented to person, place, month, year, and situation    - Patient is able to give a clear and coherent history.    - No signs of aphasia or neglect Cranial Nerves    - II: Visual Fields are full. PERRL    - III/IV/VI: EOMI without ptosis or diploplia.     - V: Facial sensation is grossly normal    - VII:  Facial movement is symmetric.     - VIII: hearing is intact to voice    - X: Uvula elevates symmetrically    - XI: Shoulder shrug is symmetric.    - XII: tongue is midline without atrophy or fasciculations.  Sensory: Sensation grossly intact to LT Cerebellar    - FNF and HKS are intact bilaterally   Results - Imaging/Labs   Results for orders placed or performed during the hospital encounter of 08/14/18 (from the past 48 hour(s))  Ethanol     Status: Abnormal   Collection Time: 08/14/18  4:55 PM  Result Value Ref Range   Alcohol, Ethyl (B) 41 (H) <10 mg/dL    Comment: (NOTE) Lowest detectable limit for serum alcohol is 10 mg/dL. For medical purposes only. Performed at [facility], [place] [address], [place], [place] [address]   Basic metabolic panel     Status: Abnormal   Collection Time: 08/14/18  4:55 PM  Result Value Ref Range   Sodium 138 135 - 145 mmol/L   Potassium 3.5 3.5 - 5.1 mmol/L   Chloride 98 98 - 111 mmol/L   CO2 27 22 - 32 mmol/L   Glucose, Bld 122 (H) 70 - 99 mg/dL   BUN 7 (L) 8 - 23 mg/dL   Creatinine, Ser 1.17 0.61 - 1.24 mg/dL   Calcium 9.2 8.9 - 10.3 mg/dL   GFR calc non Af Amer >60 >60 mL/min   GFR calc Af Amer >60 >60 mL/min    Anion gap 13 5 - 15    Comment: Performed at [facility] [address], [place], [place] [address]  CBC with Differential     Status: Abnormal   Collection Time: 08/14/18  4:55 PM  Result Value Ref Range   WBC 4.4 4.0 - 10.5 K/uL   RBC 4.21 (L) 4.22 - 5.81 MIL/uL   Hemoglobin 13.0 13.0 - 17.0 g/dL   HCT 38.9 (L) 39.0 - 52.0 %   MCV 92.4 80.0 - 100.0 fL   MCH 30.9 26.0 - 34.0 pg   MCHC 33.4 30.0 - 36.0 g/dL   RDW 12.7 11.5 - 15.5 %   Platelets 155 150 - 400 K/uL   nRBC 0.0 0.0 - 0.2 %   Neutrophils Relative % 65 %   Neutro Abs 2.9 1.7 - 7.7 K/uL   Lymphocytes Relative 25 %   Lymphs Abs 1.1 0.7 - 4.0 K/uL   Monocytes Relative 7 %   Monocytes Absolute 0.3 0.1 - 1.0 K/uL   Eosinophils Relative 2 %   Eosinophils Absolute 0.1 0.0 - 0.5 K/uL   Basophils Relative 1 %   Basophils Absolute 0.0 0.0 - 0.1 K/uL   Immature Granulocytes 0 %   Abs Immature Granulocytes 0.01 0.00 - 0.07 K/uL    Comment: Performed at [facility] [address], [place], [place] [address]  Magnesium     Status: None   Collection Time: 08/14/18  4:55 PM  Result Value Ref Range   Magnesium 2.0 1.7 - 2.4 mg/dL    Comment: Performed at [facility], [place] [address] [place], [place] [address]    Dg Shoulder Right  Result Date: 08/14/2018 CLINICAL DATA:  [place] at home 1 hour ago, lateral RIGHT shoulder pain EXAM: RIGHT SHOULDER - 2+ VIEW COMPARISON:  None FINDINGS: Osseous demineralization. AC joint alignment normal. Mild glenohumeral joint space narrowing. No acute fracture, dislocation, or bone destruction. Visualized RIGHT ribs appear intact.  IMPRESSION: No acute abnormalities. Electronically Signed   By: Lavonia Dana M.D.   On: 08/14/2018 17:35   Ct Head Wo Contrast  Addendum Date: 08/14/2018   ADDENDUM REPORT: 08/14/2018 20:06 ADDENDUM: Study discussed by telephone with Dr. Dene Gentry on 08/14/2018 at [place] hours. Electronically Signed   By: Genevie Ann M.D.   On: 08/14/2018 20:06   Result Date:  08/14/2018 CLINICAL DATA:  68 year old male with syncopal episode walking to bathroom. Pale, diaphoretic. EXAM: CT HEAD WITHOUT CONTRAST CT MAXILLOFACIAL WITHOUT CONTRAST CT CERVICAL SPINE WITHOUT CONTRAST TECHNIQUE: Multidetector CT imaging of the head, cervical spine, and maxillofacial structures were performed using the standard protocol without intravenous contrast. Multiplanar CT image reconstructions of the cervical spine and maxillofacial structures were also generated. COMPARISON:  Head CT without contrast 01/23/2018. FINDINGS: CT HEAD FINDINGS Brain: There is a small volume of subarachnoid hemorrhage in the right ambient cistern which was normal in 2019 (series 3, images 12 and 13). However, the other basilar cisterns are spared, no other subarachnoid hemorrhage is identified. No intraventricular hemorrhage. However, there is also trace right lateral subdural hematoma measuring 3-4 millimeters in thickness on coronal image 28. Underlying calvarium appears stable and intact. No other intracranial hemorrhage identified. No intracranial mass effect. No ventriculomegaly. Stable gray-white matter differentiation throughout the brain. No cortically based acute infarct identified. Vascular: Calcified atherosclerosis at the skull base. Skull: Calvarium appears stable and intact. Other: No scalp hematoma identified. CT MAXILLOFACIAL FINDINGS Osseous: Mandible intact. Relatively poor dentition. Tripod fracture on the right with comminution of the anterior right maxillary wall, posterior right maxillary wall (series 8, image 55), posterior right zygomatic arch (image 52) and nondisplaced fracture of the right orbital floor. No definite nasal bone fracture. The left maxilla and zygoma are intact. Central skull base appears intact. Orbits: Nondisplaced fracture of the right orbital floor. Minimally displaced fracture of the right lateral orbital wall (series 8, image 63). Small volume extraconal gas within the right  orbit. Mild right proptosis. No intraorbital hematoma. The right globe appears intact. The left orbital walls and soft tissues appear normal. Sinuses: Layering hemorrhage in the right maxillary sinus. Blood and/or mucosal thickening scattered in the right ethmoids, mildly involving the sphenoid sinuses and the left frontal sinus. Mild mucosal thickening in the left maxillary sinus. Tympanic cavities and mastoids are clear. Soft tissues: Negative visible noncontrast larynx, pharynx, parapharyngeal spaces, retropharyngeal space, sublingual space, submandibular and parotid spaces. Right side superficial face soft tissue hematoma/contusion with mild soft tissue gas overlying the tripod fracture. CT CERVICAL SPINE FINDINGS Alignment: Straightening of cervical lordosis. Cervicothoracic junction alignment is within normal limits. Bilateral posterior element alignment is within normal limits. Skull base and vertebrae: Visualized skull base is intact. No atlanto-occipital dissociation. Osteopenia. No cervical spine fracture identified. Soft tissues and spinal canal: No prevertebral fluid or swelling. No visible canal hematoma. Negative noncontrast neck soft tissues aside from calcified carotid atherosclerosis. Disc levels: Advanced lower cervical disc and endplate degeneration F0-X3 through C7-T1. Mild associated spinal stenosis suspected. Areas of vacuum disc and vacuum endplate geodes. Upper chest: Visible upper thoracic levels appear intact. Mild apical lung scarring. Negative noncontrast thoracic inlet. IMPRESSION: BRAIN: 1. Positive for small volume subarachnoid hemorrhage in the right ambient cistern. Burtis Junes this is posttraumatic (see #2), although CTA Head is suggested to exclude the possibility of intracranial aneurysm. 2. Trace right lateral Subdural Hematoma also. No underlying skull fracture. 3. No intracranial mass effect. FACE: 1. Tripod Fracture on the right. Comminuted right maxillary sinus walls  and posterior  right zygomatic arch. More simple and mildly displaced fractures of the right orbital floor and right lateral orbital wall. 2. Right side intraorbital gas with mild proptosis. But no intraorbital hematoma. 3. No other facial fracture. Hemorrhage and/or mucosal thickening in the paranasal sinuses. CERVICAL SPINE: 1.  No acute osseous abnormality identified. 2. Advanced lower cervical spine degeneration with mild spinal stenosis. Electronically Signed: By: Genevie Ann M.D. On: 08/14/2018 19:52   Ct Cervical Spine Wo Contrast  Addendum Date: 08/14/2018   ADDENDUM REPORT: 08/14/2018 20:06 ADDENDUM: Study discussed by telephone with Dr. Dene Gentry on 08/14/2018 at [place] hours. Electronically Signed   By: Genevie Ann M.D.   On: 08/14/2018 20:06   Result Date: 08/14/2018 CLINICAL DATA:  68 year old male with syncopal episode walking to bathroom. Pale, diaphoretic. EXAM: CT HEAD WITHOUT CONTRAST CT MAXILLOFACIAL WITHOUT CONTRAST CT CERVICAL SPINE WITHOUT CONTRAST TECHNIQUE: Multidetector CT imaging of the head, cervical spine, and maxillofacial structures were performed using the standard protocol without intravenous contrast. Multiplanar CT image reconstructions of the cervical spine and maxillofacial structures were also generated. COMPARISON:  Head CT without contrast 01/23/2018. FINDINGS: CT HEAD FINDINGS Brain: There is a small volume of subarachnoid hemorrhage in the right ambient cistern which was normal in 2019 (series 3, images 12 and 13). However, the other basilar cisterns are spared, no other subarachnoid hemorrhage is identified. No intraventricular hemorrhage. However, there is also trace right lateral subdural hematoma measuring 3-4 millimeters in thickness on coronal image 28. Underlying calvarium appears stable and intact. No other intracranial hemorrhage identified. No intracranial mass effect. No ventriculomegaly. Stable gray-white matter differentiation throughout the brain. No cortically based acute infarct  identified. Vascular: Calcified atherosclerosis at the skull base. Skull: Calvarium appears stable and intact. Other: No scalp hematoma identified. CT MAXILLOFACIAL FINDINGS Osseous: Mandible intact. Relatively poor dentition. Tripod fracture on the right with comminution of the anterior right maxillary wall, posterior right maxillary wall (series 8, image 55), posterior right zygomatic arch (image 52) and nondisplaced fracture of the right orbital floor. No definite nasal bone fracture. The left maxilla and zygoma are intact. Central skull base appears intact. Orbits: Nondisplaced fracture of the right orbital floor. Minimally displaced fracture of the right lateral orbital wall (series 8, image 63). Small volume extraconal gas within the right orbit. Mild right proptosis. No intraorbital hematoma. The right globe appears intact. The left orbital walls and soft tissues appear normal. Sinuses: Layering hemorrhage in the right maxillary sinus. Blood and/or mucosal thickening scattered in the right ethmoids, mildly involving the sphenoid sinuses and the left frontal sinus. Mild mucosal thickening in the left maxillary sinus. Tympanic cavities and mastoids are clear. Soft tissues: Negative visible noncontrast larynx, pharynx, parapharyngeal spaces, retropharyngeal space, sublingual space, submandibular and parotid spaces. Right side superficial face soft tissue hematoma/contusion with mild soft tissue gas overlying the tripod fracture. CT CERVICAL SPINE FINDINGS Alignment: Straightening of cervical lordosis. Cervicothoracic junction alignment is within normal limits. Bilateral posterior element alignment is within normal limits. Skull base and vertebrae: Visualized skull base is intact. No atlanto-occipital dissociation. Osteopenia. No cervical spine fracture identified. Soft tissues and spinal canal: No prevertebral fluid or swelling. No visible canal hematoma. Negative noncontrast neck soft tissues aside from  calcified carotid atherosclerosis. Disc levels: Advanced lower cervical disc and endplate degeneration Y1-O1 through C7-T1. Mild associated spinal stenosis suspected. Areas of vacuum disc and vacuum endplate geodes. Upper chest: Visible upper thoracic levels appear intact. Mild apical lung scarring. Negative noncontrast thoracic inlet. IMPRESSION:  BRAIN: 1. Positive for small volume subarachnoid hemorrhage in the right ambient cistern. Burtis Junes this is posttraumatic (see #2), although CTA Head is suggested to exclude the possibility of intracranial aneurysm. 2. Trace right lateral Subdural Hematoma also. No underlying skull fracture. 3. No intracranial mass effect. FACE: 1. Tripod Fracture on the right. Comminuted right maxillary sinus walls and posterior right zygomatic arch. More simple and mildly displaced fractures of the right orbital floor and right lateral orbital wall. 2. Right side intraorbital gas with mild proptosis. But no intraorbital hematoma. 3. No other facial fracture. Hemorrhage and/or mucosal thickening in the paranasal sinuses. CERVICAL SPINE: 1.  No acute osseous abnormality identified. 2. Advanced lower cervical spine degeneration with mild spinal stenosis. Electronically Signed: By: Genevie Ann M.D. On: 08/14/2018 19:52   Dg Chest Port 1 View  Result Date: 08/14/2018 CLINICAL DATA:  [place] at home 1 hour ago, congestion and productive cough for 1-2 weeks, RIGHT lateral shoulder pain, hypertension, smoker, history of pancreatitis, cirrhosis, diabetes mellitus EXAM: PORTABLE CHEST 1 VIEW COMPARISON:  Portable exam 1659 hours compared to 09/02/2015 FINDINGS: Normal heart size, mediastinal contours, and pulmonary vascularity. Probable emphysematous changes without infiltrate, pleural effusion, or pneumothorax. Bones demineralized. IMPRESSION: Probable emphysematous changes without infiltrate. Electronically Signed   By: Lavonia Dana M.D.   On: 08/14/2018 17:34   Ct Maxillofacial Wo Cm  Addendum Date:  08/14/2018   ADDENDUM REPORT: 08/14/2018 20:06 ADDENDUM: Study discussed by telephone with Dr. Dene Gentry on 08/14/2018 at [place] hours. Electronically Signed   By: Genevie Ann M.D.   On: 08/14/2018 20:06   Result Date: 08/14/2018 CLINICAL DATA:  68 year old male with syncopal episode walking to bathroom. Pale, diaphoretic. EXAM: CT HEAD WITHOUT CONTRAST CT MAXILLOFACIAL WITHOUT CONTRAST CT CERVICAL SPINE WITHOUT CONTRAST TECHNIQUE: Multidetector CT imaging of the head, cervical spine, and maxillofacial structures were performed using the standard protocol without intravenous contrast. Multiplanar CT image reconstructions of the cervical spine and maxillofacial structures were also generated. COMPARISON:  Head CT without contrast 01/23/2018. FINDINGS: CT HEAD FINDINGS Brain: There is a small volume of subarachnoid hemorrhage in the right ambient cistern which was normal in 2019 (series 3, images 12 and 13). However, the other basilar cisterns are spared, no other subarachnoid hemorrhage is identified. No intraventricular hemorrhage. However, there is also trace right lateral subdural hematoma measuring 3-4 millimeters in thickness on coronal image 28. Underlying calvarium appears stable and intact. No other intracranial hemorrhage identified. No intracranial mass effect. No ventriculomegaly. Stable gray-white matter differentiation throughout the brain. No cortically based acute infarct identified. Vascular: Calcified atherosclerosis at the skull base. Skull: Calvarium appears stable and intact. Other: No scalp hematoma identified. CT MAXILLOFACIAL FINDINGS Osseous: Mandible intact. Relatively poor dentition. Tripod fracture on the right with comminution of the anterior right maxillary wall, posterior right maxillary wall (series 8, image 55), posterior right zygomatic arch (image 52) and nondisplaced fracture of the right orbital floor. No definite nasal bone fracture. The left maxilla and zygoma are intact. Central  skull base appears intact. Orbits: Nondisplaced fracture of the right orbital floor. Minimally displaced fracture of the right lateral orbital wall (series 8, image 63). Small volume extraconal gas within the right orbit. Mild right proptosis. No intraorbital hematoma. The right globe appears intact. The left orbital walls and soft tissues appear normal. Sinuses: Layering hemorrhage in the right maxillary sinus. Blood and/or mucosal thickening scattered in the right ethmoids, mildly involving the sphenoid sinuses and the left frontal sinus. Mild mucosal thickening in the  left maxillary sinus. Tympanic cavities and mastoids are clear. Soft tissues: Negative visible noncontrast larynx, pharynx, parapharyngeal spaces, retropharyngeal space, sublingual space, submandibular and parotid spaces. Right side superficial face soft tissue hematoma/contusion with mild soft tissue gas overlying the tripod fracture. CT CERVICAL SPINE FINDINGS Alignment: Straightening of cervical lordosis. Cervicothoracic junction alignment is within normal limits. Bilateral posterior element alignment is within normal limits. Skull base and vertebrae: Visualized skull base is intact. No atlanto-occipital dissociation. Osteopenia. No cervical spine fracture identified. Soft tissues and spinal canal: No prevertebral fluid or swelling. No visible canal hematoma. Negative noncontrast neck soft tissues aside from calcified carotid atherosclerosis. Disc levels: Advanced lower cervical disc and endplate degeneration E1-D4 through C7-T1. Mild associated spinal stenosis suspected. Areas of vacuum disc and vacuum endplate geodes. Upper chest: Visible upper thoracic levels appear intact. Mild apical lung scarring. Negative noncontrast thoracic inlet. IMPRESSION: BRAIN: 1. Positive for small volume subarachnoid hemorrhage in the right ambient cistern. Burtis Junes this is posttraumatic (see #2), although CTA Head is suggested to exclude the possibility of  intracranial aneurysm. 2. Trace right lateral Subdural Hematoma also. No underlying skull fracture. 3. No intracranial mass effect. FACE: 1. Tripod Fracture on the right. Comminuted right maxillary sinus walls and posterior right zygomatic arch. More simple and mildly displaced fractures of the right orbital floor and right lateral orbital wall. 2. Right side intraorbital gas with mild proptosis. But no intraorbital hematoma. 3. No other facial fracture. Hemorrhage and/or mucosal thickening in the paranasal sinuses. CERVICAL SPINE: 1.  No acute osseous abnormality identified. 2. Advanced lower cervical spine degeneration with mild spinal stenosis. Electronically Signed: By: Genevie Ann M.D. On: 08/14/2018 19:52   Impression/Plan   68 y.o. male with multiple injuries after what sounds like a syncopal episode. Imaging reviewed as it pertains to the brain and spine.   Small volume SAH right ambient cistern - Minimal. No acute NS intervention. - Likely traumatic, however given syncopal episode with rule out aneurysm as preceding incident. CTA head ordered.  - Keppra 500mg  BID x7days for seizure prophylaxis - Neuro check q 2 hours, report any change - Repeat head CT tomorrow am for monitoring  Right lateral SDH - Minimal without mass effect. No acute NS intervention. Should resolve with time.  - Keppra as above  Alcohol dependence - per admitting team Tripod fracture - per admitting team  Will follow. Please call for any concerns

## 2018-08-14 NOTE — H&P (Signed)
Activation and Reason: consult, fall  Primary Survey: airway intact, breath sounds present bilaterally, pulses intact  Alexander Schmitt is an 68 y.o. male.  HPI: 68 yo male was drinking earlier and was walking out of the bathroom when he passed out and fell. He has pain in his right side. He has blacked out once before a few months back. He restarted drinking about 2-3 weeks ago.  Past Medical History:  Diagnosis Date  . Alcoholism ([place]) since before 2013  . Anemia 08/2015   PRBC x 2 units 08/2015.   . Arthritis    left shoulder   . Ascites 11/2005, 05/2012   05/2012:4 liter paracentesis, no SBP   . Cirrhosis ([place]) 2013  . Colon polyp 09/07/15   ascenging colon, minor/scattered diverticulosis on colonoscopy  . Diabetes mellitus without complication ([place])    PT. DENIES  . Effusion of right knee 03/2014  . GERD (gastroesophageal reflux disease)    occasional   . History of stomach ulcers   . Pancreatitis 01/5448   acute alcoholic and chronic; pseudocyst and pancreatic calcifications 11/2011  . SBO (small bowel obstruction) ([place]) 11/2005   medical mgt  . 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 COLONOSCOPY N/A 09/07/2015   Procedure: COLONOSCOPY;  Surgeon: Irene Shipper, MD;  Location: [facility] ENDOSCOPY;  Service: Endoscopy;  Laterality: N/A;  . COLONOSCOPY    . ESOPHAGOGASTRODUODENOSCOPY Left 09/02/2015   Procedure: ESOPHAGOGASTRODUODENOSCOPY (EGD);  Surgeon: Manus Gunning, MD;  Location: [facility];  Service: Gastroenterology;  Laterality: Left;  . EUS N/A 07/13/2013   Procedure: ESOPHAGEAL ENDOSCOPIC ULTRASOUND (EUS) RADIAL;  Surgeon: Arta Silence, MD;  Location: [facility] ENDOSCOPY;  Service: Endoscopy;  Laterality: N/A;  . INGUINAL HERNIA REPAIR Right 09/29/2012   Procedure: HERNIA REPAIR INGUINAL ADULT;  Surgeon: Imogene Burn. Georgette Dover, MD;  Location: [facility] ORS;  Service: General;   Laterality: Right;  . INSERTION OF MESH Right 09/29/2012   Procedure: INSERTION OF MESH;  Surgeon: Imogene Burn. Georgette Dover, MD;  Location: [facility] ORS;  Service: General;  Laterality: Right;  . UMBILICAL HERNIA REPAIR N/A 09/29/2012   Procedure: HERNIA REPAIR UMBILICAL ADULT;  Surgeon: Imogene Burn. Georgette Dover, MD;  Location: [facility] ORS;  Service: General;  Laterality: N/A;    Family History  Problem Relation Age of Onset  . Hypertension Father   . Colon cancer Neg Hx     Social History:  reports that he has been smoking cigarettes. He has a 10.00 pack-year smoking history. He has never used smokeless tobacco. He reports current alcohol use. He reports that he does not use drugs.  Allergies: No Known Allergies  Medications: I have reviewed the patient's current medications.  Results for orders placed or performed during the hospital encounter of 08/14/18 (from the past 48 hour(s))  Ethanol     Status: Abnormal   Collection Time: 08/14/18  4:55 PM  Result Value Ref Range   Alcohol, Ethyl (B) 41 (H) <10 mg/dL    Comment: (NOTE) Lowest detectable limit for serum alcohol is 10 mg/dL. For medical purposes only. Performed at [facility], [place] [address] [place], [place] [address]   Basic metabolic panel     Status: Abnormal   Collection Time: 08/14/18  4:55 PM  Result Value Ref Range   Sodium 138 135 - 145 mmol/L   Potassium 3.5 3.5 - 5.1 mmol/L  Chloride 98 98 - 111 mmol/L   CO2 27 22 - 32 mmol/L   Glucose, Bld 122 (H) 70 - 99 mg/dL   BUN 7 (L) 8 - 23 mg/dL   Creatinine, Ser 1.17 0.61 - 1.24 mg/dL   Calcium 9.2 8.9 - 10.3 mg/dL   GFR calc non Af Amer >60 >60 mL/min   GFR calc Af Amer >60 >60 mL/min   Anion gap 13 5 - 15    Comment: Performed at [facility] [address], [place], [place] [address]  CBC with Differential     Status: Abnormal   Collection Time: 08/14/18  4:55 PM  Result Value Ref Range   WBC 4.4 4.0 - 10.5 K/uL   RBC 4.21 (L) 4.22 - 5.81 MIL/uL   Hemoglobin 13.0  13.0 - 17.0 g/dL   HCT 38.9 (L) 39.0 - 52.0 %   MCV 92.4 80.0 - 100.0 fL   MCH 30.9 26.0 - 34.0 pg   MCHC 33.4 30.0 - 36.0 g/dL   RDW 12.7 11.5 - 15.5 %   Platelets 155 150 - 400 K/uL   nRBC 0.0 0.0 - 0.2 %   Neutrophils Relative % 65 %   Neutro Abs 2.9 1.7 - 7.7 K/uL   Lymphocytes Relative 25 %   Lymphs Abs 1.1 0.7 - 4.0 K/uL   Monocytes Relative 7 %   Monocytes Absolute 0.3 0.1 - 1.0 K/uL   Eosinophils Relative 2 %   Eosinophils Absolute 0.1 0.0 - 0.5 K/uL   Basophils Relative 1 %   Basophils Absolute 0.0 0.0 - 0.1 K/uL   Immature Granulocytes 0 %   Abs Immature Granulocytes 0.01 0.00 - 0.07 K/uL    Comment: Performed at [facility] [address] [place], [place] [address]  Magnesium     Status: None   Collection Time: 08/14/18  4:55 PM  Result Value Ref Range   Magnesium 2.0 1.7 - 2.4 mg/dL    Comment: Performed at [facility], [place] [address] [place], [place] [address]    Dg Shoulder Right  Result Date: 08/14/2018 CLINICAL DATA:  [place] at home 1 hour ago, lateral RIGHT shoulder pain EXAM: RIGHT SHOULDER - 2+ VIEW COMPARISON:  None FINDINGS: Osseous demineralization. AC joint alignment normal. Mild glenohumeral joint space narrowing. No acute fracture, dislocation, or bone destruction. Visualized RIGHT ribs appear intact. IMPRESSION: No acute abnormalities. Electronically Signed   By: Lavonia Dana M.D.   On: 08/14/2018 17:35   Ct Head Wo Contrast  Addendum Date: 08/14/2018   ADDENDUM REPORT: 08/14/2018 20:06 ADDENDUM: Study discussed by telephone with Dr. Dene Gentry on 08/14/2018 at [place] hours. Electronically Signed   By: Genevie Ann M.D.   On: 08/14/2018 20:06   Result Date: 08/14/2018 CLINICAL DATA:  68 year old male with syncopal episode walking to bathroom. Pale, diaphoretic. EXAM: CT HEAD WITHOUT CONTRAST CT MAXILLOFACIAL WITHOUT CONTRAST CT CERVICAL SPINE WITHOUT CONTRAST TECHNIQUE: Multidetector CT imaging of the head, cervical spine, and maxillofacial  structures were performed using the standard protocol without intravenous contrast. Multiplanar CT image reconstructions of the cervical spine and maxillofacial structures were also generated. COMPARISON:  Head CT without contrast 01/23/2018. FINDINGS: CT HEAD FINDINGS Brain: There is a small volume of subarachnoid hemorrhage in the right ambient cistern which was normal in 2019 (series 3, images 12 and 13). However, the other basilar cisterns are spared, no other subarachnoid hemorrhage is identified. No intraventricular hemorrhage. However, there is also trace right lateral  subdural hematoma measuring 3-4 millimeters in thickness on coronal image 28. Underlying calvarium appears stable and intact. No other intracranial hemorrhage identified. No intracranial mass effect. No ventriculomegaly. Stable gray-white matter differentiation throughout the brain. No cortically based acute infarct identified. Vascular: Calcified atherosclerosis at the skull base. Skull: Calvarium appears stable and intact. Other: No scalp hematoma identified. CT MAXILLOFACIAL FINDINGS Osseous: Mandible intact. Relatively poor dentition. Tripod fracture on the right with comminution of the anterior right maxillary wall, posterior right maxillary wall (series 8, image 55), posterior right zygomatic arch (image 52) and nondisplaced fracture of the right orbital floor. No definite nasal bone fracture. The left maxilla and zygoma are intact. Central skull base appears intact. Orbits: Nondisplaced fracture of the right orbital floor. Minimally displaced fracture of the right lateral orbital wall (series 8, image 63). Small volume extraconal gas within the right orbit. Mild right proptosis. No intraorbital hematoma. The right globe appears intact. The left orbital walls and soft tissues appear normal. Sinuses: Layering hemorrhage in the right maxillary sinus. Blood and/or mucosal thickening scattered in the right ethmoids, mildly involving the  sphenoid sinuses and the left frontal sinus. Mild mucosal thickening in the left maxillary sinus. Tympanic cavities and mastoids are clear. Soft tissues: Negative visible noncontrast larynx, pharynx, parapharyngeal spaces, retropharyngeal space, sublingual space, submandibular and parotid spaces. Right side superficial face soft tissue hematoma/contusion with mild soft tissue gas overlying the tripod fracture. CT CERVICAL SPINE FINDINGS Alignment: Straightening of cervical lordosis. Cervicothoracic junction alignment is within normal limits. Bilateral posterior element alignment is within normal limits. Skull base and vertebrae: Visualized skull base is intact. No atlanto-occipital dissociation. Osteopenia. No cervical spine fracture identified. Soft tissues and spinal canal: No prevertebral fluid or swelling. No visible canal hematoma. Negative noncontrast neck soft tissues aside from calcified carotid atherosclerosis. Disc levels: Advanced lower cervical disc and endplate degeneration Z6-X0 through C7-T1. Mild associated spinal stenosis suspected. Areas of vacuum disc and vacuum endplate geodes. Upper chest: Visible upper thoracic levels appear intact. Mild apical lung scarring. Negative noncontrast thoracic inlet. IMPRESSION: BRAIN: 1. Positive for small volume subarachnoid hemorrhage in the right ambient cistern. Alexander Schmitt this is posttraumatic (see #2), although CTA Head is suggested to exclude the possibility of intracranial aneurysm. 2. Trace right lateral Subdural Hematoma also. No underlying skull fracture. 3. No intracranial mass effect. FACE: 1. Tripod Fracture on the right. Comminuted right maxillary sinus walls and posterior right zygomatic arch. More simple and mildly displaced fractures of the right orbital floor and right lateral orbital wall. 2. Right side intraorbital gas with mild proptosis. But no intraorbital hematoma. 3. No other facial fracture. Hemorrhage and/or mucosal thickening in the  paranasal sinuses. CERVICAL SPINE: 1.  No acute osseous abnormality identified. 2. Advanced lower cervical spine degeneration with mild spinal stenosis. Electronically Signed: By: Genevie Ann M.D. On: 08/14/2018 19:52   Ct Cervical Spine Wo Contrast  Addendum Date: 08/14/2018   ADDENDUM REPORT: 08/14/2018 20:06 ADDENDUM: Study discussed by telephone with Dr. Dene Gentry on 08/14/2018 at [place] hours. Electronically Signed   By: Genevie Ann M.D.   On: 08/14/2018 20:06   Result Date: 08/14/2018 CLINICAL DATA:  68 year old male with syncopal episode walking to bathroom. Pale, diaphoretic. EXAM: CT HEAD WITHOUT CONTRAST CT MAXILLOFACIAL WITHOUT CONTRAST CT CERVICAL SPINE WITHOUT CONTRAST TECHNIQUE: Multidetector CT imaging of the head, cervical spine, and maxillofacial structures were performed using the standard protocol without intravenous contrast. Multiplanar CT image reconstructions of the cervical spine and maxillofacial structures were also generated. COMPARISON:  Head CT without contrast 01/23/2018. FINDINGS: CT HEAD FINDINGS Brain: There is a small volume of subarachnoid hemorrhage in the right ambient cistern which was normal in 2019 (series 3, images 12 and 13). However, the other basilar cisterns are spared, no other subarachnoid hemorrhage is identified. No intraventricular hemorrhage. However, there is also trace right lateral subdural hematoma measuring 3-4 millimeters in thickness on coronal image 28. Underlying calvarium appears stable and intact. No other intracranial hemorrhage identified. No intracranial mass effect. No ventriculomegaly. Stable gray-white matter differentiation throughout the brain. No cortically based acute infarct identified. Vascular: Calcified atherosclerosis at the skull base. Skull: Calvarium appears stable and intact. Other: No scalp hematoma identified. CT MAXILLOFACIAL FINDINGS Osseous: Mandible intact. Relatively poor dentition. Tripod fracture on the right with comminution of  the anterior right maxillary wall, posterior right maxillary wall (series 8, image 55), posterior right zygomatic arch (image 52) and nondisplaced fracture of the right orbital floor. No definite nasal bone fracture. The left maxilla and zygoma are intact. Central skull base appears intact. Orbits: Nondisplaced fracture of the right orbital floor. Minimally displaced fracture of the right lateral orbital wall (series 8, image 63). Small volume extraconal gas within the right orbit. Mild right proptosis. No intraorbital hematoma. The right globe appears intact. The left orbital walls and soft tissues appear normal. Sinuses: Layering hemorrhage in the right maxillary sinus. Blood and/or mucosal thickening scattered in the right ethmoids, mildly involving the sphenoid sinuses and the left frontal sinus. Mild mucosal thickening in the left maxillary sinus. Tympanic cavities and mastoids are clear. Soft tissues: Negative visible noncontrast larynx, pharynx, parapharyngeal spaces, retropharyngeal space, sublingual space, submandibular and parotid spaces. Right side superficial face soft tissue hematoma/contusion with mild soft tissue gas overlying the tripod fracture. CT CERVICAL SPINE FINDINGS Alignment: Straightening of cervical lordosis. Cervicothoracic junction alignment is within normal limits. Bilateral posterior element alignment is within normal limits. Skull base and vertebrae: Visualized skull base is intact. No atlanto-occipital dissociation. Osteopenia. No cervical spine fracture identified. Soft tissues and spinal canal: No prevertebral fluid or swelling. No visible canal hematoma. Negative noncontrast neck soft tissues aside from calcified carotid atherosclerosis. Disc levels: Advanced lower cervical disc and endplate degeneration S2-G3 through C7-T1. Mild associated spinal stenosis suspected. Areas of vacuum disc and vacuum endplate geodes. Upper chest: Visible upper thoracic levels appear intact. Mild  apical lung scarring. Negative noncontrast thoracic inlet. IMPRESSION: BRAIN: 1. Positive for small volume subarachnoid hemorrhage in the right ambient cistern. Alexander Schmitt this is posttraumatic (see #2), although CTA Head is suggested to exclude the possibility of intracranial aneurysm. 2. Trace right lateral Subdural Hematoma also. No underlying skull fracture. 3. No intracranial mass effect. FACE: 1. Tripod Fracture on the right. Comminuted right maxillary sinus walls and posterior right zygomatic arch. More simple and mildly displaced fractures of the right orbital floor and right lateral orbital wall. 2. Right side intraorbital gas with mild proptosis. But no intraorbital hematoma. 3. No other facial fracture. Hemorrhage and/or mucosal thickening in the paranasal sinuses. CERVICAL SPINE: 1.  No acute osseous abnormality identified. 2. Advanced lower cervical spine degeneration with mild spinal stenosis. Electronically Signed: By: Genevie Ann M.D. On: 08/14/2018 19:52   Dg Chest Port 1 View  Result Date: 08/14/2018 CLINICAL DATA:  [place] [address] at home 1 hour ago, congestion and productive cough for 1-2 weeks, RIGHT lateral shoulder pain, hypertension, smoker, history of pancreatitis, cirrhosis, diabetes mellitus EXAM: PORTABLE CHEST 1 VIEW COMPARISON:  Portable exam 1659 hours compared to 09/02/2015 FINDINGS: Normal heart  size, mediastinal contours, and pulmonary vascularity. Probable emphysematous changes without infiltrate, pleural effusion, or pneumothorax. Bones demineralized. IMPRESSION: Probable emphysematous changes without infiltrate. Electronically Signed   By: Lavonia Dana M.D.   On: 08/14/2018 17:34   Ct Maxillofacial Wo Cm  Addendum Date: 08/14/2018   ADDENDUM REPORT: 08/14/2018 20:06 ADDENDUM: Study discussed by telephone with Dr. Dene Gentry on 08/14/2018 at [place] hours. Electronically Signed   By: Genevie Ann M.D.   On: 08/14/2018 20:06   Result Date: 08/14/2018 CLINICAL DATA:  68 year old male with syncopal  episode walking to bathroom. Pale, diaphoretic. EXAM: CT HEAD WITHOUT CONTRAST CT MAXILLOFACIAL WITHOUT CONTRAST CT CERVICAL SPINE WITHOUT CONTRAST TECHNIQUE: Multidetector CT imaging of the head, cervical spine, and maxillofacial structures were performed using the standard protocol without intravenous contrast. Multiplanar CT image reconstructions of the cervical spine and maxillofacial structures were also generated. COMPARISON:  Head CT without contrast 01/23/2018. FINDINGS: CT HEAD FINDINGS Brain: There is a small volume of subarachnoid hemorrhage in the right ambient cistern which was normal in 2019 (series 3, images 12 and 13). However, the other basilar cisterns are spared, no other subarachnoid hemorrhage is identified. No intraventricular hemorrhage. However, there is also trace right lateral subdural hematoma measuring 3-4 millimeters in thickness on coronal image 28. Underlying calvarium appears stable and intact. No other intracranial hemorrhage identified. No intracranial mass effect. No ventriculomegaly. Stable gray-white matter differentiation throughout the brain. No cortically based acute infarct identified. Vascular: Calcified atherosclerosis at the skull base. Skull: Calvarium appears stable and intact. Other: No scalp hematoma identified. CT MAXILLOFACIAL FINDINGS Osseous: Mandible intact. Relatively poor dentition. Tripod fracture on the right with comminution of the anterior right maxillary wall, posterior right maxillary wall (series 8, image 55), posterior right zygomatic arch (image 52) and nondisplaced fracture of the right orbital floor. No definite nasal bone fracture. The left maxilla and zygoma are intact. Central skull base appears intact. Orbits: Nondisplaced fracture of the right orbital floor. Minimally displaced fracture of the right lateral orbital wall (series 8, image 63). Small volume extraconal gas within the right orbit. Mild right proptosis. No intraorbital hematoma. The  right globe appears intact. The left orbital walls and soft tissues appear normal. Sinuses: Layering hemorrhage in the right maxillary sinus. Blood and/or mucosal thickening scattered in the right ethmoids, mildly involving the sphenoid sinuses and the left frontal sinus. Mild mucosal thickening in the left maxillary sinus. Tympanic cavities and mastoids are clear. Soft tissues: Negative visible noncontrast larynx, pharynx, parapharyngeal spaces, retropharyngeal space, sublingual space, submandibular and parotid spaces. Right side superficial face soft tissue hematoma/contusion with mild soft tissue gas overlying the tripod fracture. CT CERVICAL SPINE FINDINGS Alignment: Straightening of cervical lordosis. Cervicothoracic junction alignment is within normal limits. Bilateral posterior element alignment is within normal limits. Skull base and vertebrae: Visualized skull base is intact. No atlanto-occipital dissociation. Osteopenia. No cervical spine fracture identified. Soft tissues and spinal canal: No prevertebral fluid or swelling. No visible canal hematoma. Negative noncontrast neck soft tissues aside from calcified carotid atherosclerosis. Disc levels: Advanced lower cervical disc and endplate degeneration Alexander Schmitt through C7-T1. Mild associated spinal stenosis suspected. Areas of vacuum disc and vacuum endplate geodes. Upper chest: Visible upper thoracic levels appear intact. Mild apical lung scarring. Negative noncontrast thoracic inlet. IMPRESSION: BRAIN: 1. Positive for small volume subarachnoid hemorrhage in the right ambient cistern. Alexander Schmitt this is posttraumatic (see #2), although CTA Head is suggested to exclude the possibility of intracranial aneurysm. 2. Trace right lateral Subdural Hematoma also. No underlying  skull fracture. 3. No intracranial mass effect. FACE: 1. Tripod Fracture on the right. Comminuted right maxillary sinus walls and posterior right zygomatic arch. More simple and mildly displaced  fractures of the right orbital floor and right lateral orbital wall. 2. Right side intraorbital gas with mild proptosis. But no intraorbital hematoma. 3. No other facial fracture. Hemorrhage and/or mucosal thickening in the paranasal sinuses. CERVICAL SPINE: 1.  No acute osseous abnormality identified. 2. Advanced lower cervical spine degeneration with mild spinal stenosis. Electronically Signed: By: Genevie Ann M.D. On: 08/14/2018 19:52    Review of Systems  Constitutional: Negative for chills and fever.  HENT: Negative for hearing loss.   Eyes: Negative for blurred vision and double vision.  Respiratory: Negative for cough and hemoptysis.   Cardiovascular: Negative for chest pain and palpitations.  Gastrointestinal: Negative for abdominal pain, nausea and vomiting.  Genitourinary: Negative for dysuria and urgency.  Musculoskeletal: Negative for myalgias and neck pain.  Skin: Negative for itching and rash.  Neurological: Negative for dizziness, tingling and headaches.  Endo/Heme/Allergies: Does not bruise/bleed easily.  Psychiatric/Behavioral: Negative for depression and suicidal ideas.   Blood pressure (!) 145/80, pulse 78, temperature 97.7 F (36.5 C), temperature source Oral, resp. rate 20, height 5\' 10"  (1.778 m), weight 65.8 kg, SpO2 96 %. Physical Exam  Constitutional: He is oriented to person, place, and time. He appears well-developed and well-nourished.  HENT:  Head: Not microcephalic. Head is without raccoon's eyes, without abrasion and without contusion.  Right Ear: No drainage or swelling. No foreign bodies.  Left Ear: No drainage or swelling. No foreign bodies.  Nose: No mucosal edema, rhinorrhea or nose lacerations.  Mouth/Throat: Oropharynx is clear and moist and mucous membranes are normal.  Eyes: Pupils are equal, round, and reactive to light. EOM are normal. Right eye exhibits no discharge. Left eye exhibits no discharge.  Neck: Neck supple.  Cardiovascular:  Pulses:       Carotid pulses are 2+ on the right side and 2+ on the left side.      Radial pulses are 2+ on the right side and 2+ on the left side.       Dorsalis pedis pulses are 2+ on the right side and 2+ on the left side.  Respiratory: No apnea. He has no decreased breath sounds. He has no wheezes. He has no rhonchi. He has no rales.  GI: He exhibits no shifting dullness and no distension. There is no abdominal tenderness. There is no rigidity, no guarding, no tenderness at McBurney's point and negative Murphy's sign.  Neurological: He is alert and oriented to person, place, and time. He has normal strength. No cranial nerve deficit or sensory deficit. GCS eye subscore is 4. GCS verbal subscore is 5. GCS motor subscore is 6.  Skin:  Abrasions right face  Psychiatric: His speech is normal and behavior is normal. Thought content normal. His mood appears anxious.      Assessment/Plan: 68 yo male with fall from standing height SDH- repeat CT in am, neuro checks Orbital floor fracture- consult ENT, pain control  FEN- NPO proph- mechanical for now dispo- admit to hospital  Procedures: none  Arta Bruce Tiaja Hagan 08/14/2018, 9:07 PM

## 2018-08-15 ENCOUNTER — Observation Stay (HOSPITAL_COMMUNITY): Payer: Medicare Other

## 2018-08-15 DIAGNOSIS — Y92009 Unspecified place in unspecified non-institutional (private) residence as the place of occurrence of the external cause: Secondary | ICD-10-CM | POA: Diagnosis not present

## 2018-08-15 DIAGNOSIS — F17210 Nicotine dependence, cigarettes, uncomplicated: Secondary | ICD-10-CM | POA: Diagnosis present

## 2018-08-15 DIAGNOSIS — S01411A Laceration without foreign body of right cheek and temporomandibular area, initial encounter: Secondary | ICD-10-CM | POA: Diagnosis present

## 2018-08-15 DIAGNOSIS — S065X0A Traumatic subdural hemorrhage without loss of consciousness, initial encounter: Secondary | ICD-10-CM | POA: Diagnosis present

## 2018-08-15 DIAGNOSIS — S0231XA Fracture of orbital floor, right side, initial encounter for closed fracture: Secondary | ICD-10-CM | POA: Diagnosis present

## 2018-08-15 DIAGNOSIS — Z8249 Family history of ischemic heart disease and other diseases of the circulatory system: Secondary | ICD-10-CM | POA: Diagnosis not present

## 2018-08-15 DIAGNOSIS — F1020 Alcohol dependence, uncomplicated: Secondary | ICD-10-CM | POA: Diagnosis present

## 2018-08-15 DIAGNOSIS — S066X9A Traumatic subarachnoid hemorrhage with loss of consciousness of unspecified duration, initial encounter: Secondary | ICD-10-CM | POA: Diagnosis present

## 2018-08-15 DIAGNOSIS — M25562 Pain in left knee: Secondary | ICD-10-CM | POA: Diagnosis present

## 2018-08-15 DIAGNOSIS — I629 Nontraumatic intracranial hemorrhage, unspecified: Secondary | ICD-10-CM | POA: Diagnosis present

## 2018-08-15 DIAGNOSIS — Z86718 Personal history of other venous thrombosis and embolism: Secondary | ICD-10-CM | POA: Diagnosis not present

## 2018-08-15 DIAGNOSIS — K219 Gastro-esophageal reflux disease without esophagitis: Secondary | ICD-10-CM | POA: Diagnosis present

## 2018-08-15 DIAGNOSIS — Z8711 Personal history of peptic ulcer disease: Secondary | ICD-10-CM | POA: Diagnosis not present

## 2018-08-15 DIAGNOSIS — W010XXA Fall on same level from slipping, tripping and stumbling without subsequent striking against object, initial encounter: Secondary | ICD-10-CM | POA: Diagnosis not present

## 2018-08-15 DIAGNOSIS — Z23 Encounter for immunization: Secondary | ICD-10-CM | POA: Diagnosis present

## 2018-08-15 DIAGNOSIS — I10 Essential (primary) hypertension: Secondary | ICD-10-CM | POA: Diagnosis present

## 2018-08-15 DIAGNOSIS — K7460 Unspecified cirrhosis of liver: Secondary | ICD-10-CM | POA: Diagnosis present

## 2018-08-15 LAB — CBC
HCT: 36.7 % — ABNORMAL LOW (ref 39.0–52.0)
Hemoglobin: 12.2 g/dL — ABNORMAL LOW (ref 13.0–17.0)
MCH: 30 pg (ref 26.0–34.0)
MCHC: 33.2 g/dL (ref 30.0–36.0)
MCV: 90.4 fL (ref 80.0–100.0)
NRBC: 0 % (ref 0.0–0.2)
Platelets: 135 10*3/uL — ABNORMAL LOW (ref 150–400)
RBC: 4.06 MIL/uL — ABNORMAL LOW (ref 4.22–5.81)
RDW: 12.7 % (ref 11.5–15.5)
WBC: 7.5 10*3/uL (ref 4.0–10.5)

## 2018-08-15 LAB — BASIC METABOLIC PANEL
Anion gap: 13 (ref 5–15)
BUN: <5 mg/dL — ABNORMAL LOW (ref 8–23)
CO2: 24 mmol/L (ref 22–32)
Calcium: 8.6 mg/dL — ABNORMAL LOW (ref 8.9–10.3)
Chloride: 98 mmol/L (ref 98–111)
Creatinine, Ser: 0.85 mg/dL (ref 0.61–1.24)
GFR calc non Af Amer: >60 mL/min (ref >60)
Glucose, Bld: 127 mg/dL — ABNORMAL HIGH (ref 70–99)
Potassium: 3.2 mmol/L — ABNORMAL LOW (ref 3.5–5.1)
Sodium: 135 mmol/L (ref 135–145)

## 2018-08-15 LAB — MRSA PCR SCREENING: MRSA by PCR: NEGATIVE

## 2018-08-15 LAB — HIV ANTIBODY (ROUTINE TESTING W REFLEX): HIV Screen 4th Generation wRfx: NONREACTIVE

## 2018-08-15 MED ORDER — FOLIC ACID 1 MG PO TABS
1.0000 mg | ORAL_TABLET | Freq: Every day | ORAL | Status: DC
  Administered 2018-08-15 – 2018-08-16 (×2): 1 mg via ORAL
  Filled 2018-08-15 (×2): qty 1

## 2018-08-15 MED ORDER — LORAZEPAM 1 MG PO TABS: 1.0000 mg | ORAL_TABLET | Freq: Four times a day (QID) | ORAL | Status: DC | PRN

## 2018-08-15 MED ORDER — LORAZEPAM 1 MG PO TABS: 0.0000 mg | ORAL_TABLET | Freq: Four times a day (QID) | ORAL | Status: DC

## 2018-08-15 MED ORDER — VITAMIN B-1 100 MG PO TABS
100.0000 mg | ORAL_TABLET | Freq: Every day | ORAL | Status: DC
  Administered 2018-08-15 – 2018-08-16 (×2): 100 mg via ORAL
  Filled 2018-08-15 (×2): qty 1

## 2018-08-15 MED ORDER — THIAMINE HCL 100 MG/ML IJ SOLN: 100.0000 mg | Freq: Every day | INTRAMUSCULAR | Status: DC

## 2018-08-15 MED ORDER — PANTOPRAZOLE SODIUM 40 MG PO TBEC
40.0000 mg | DELAYED_RELEASE_TABLET | Freq: Every day | ORAL | Status: DC
  Administered 2018-08-15 – 2018-08-16 (×2): 40 mg via ORAL
  Filled 2018-08-15 (×2): qty 1

## 2018-08-15 MED ORDER — LORAZEPAM 2 MG/ML IJ SOLN: 1.0000 mg | Freq: Four times a day (QID) | INTRAMUSCULAR | Status: DC | PRN

## 2018-08-15 MED ORDER — LORAZEPAM 1 MG PO TABS: 0.0000 mg | ORAL_TABLET | Freq: Two times a day (BID) | ORAL | Status: DC

## 2018-08-15 MED ORDER — ADULT MULTIVITAMIN W/MINERALS CH
1.0000 | ORAL_TABLET | Freq: Every day | ORAL | Status: DC
  Administered 2018-08-15 – 2018-08-16 (×2): 1 via ORAL
  Filled 2018-08-15 (×2): qty 1

## 2018-08-15 NOTE — Evaluation (Signed)
Occupational Therapy Evaluation Patient Details Name: Alexander Schmitt MRN: [id number] DOB: 1951/04/28 Today's Date: 08/15/2018    History of Present Illness This 68 y.o. male admitted after falling striking his head, Rt cheek and shoulder.  CT of head showed  insignificant right tentorial SDH.  PMH includes ETOH abuse, cirrhosis, chronic calcific pancreatitis, UGI bleed    Clinical Impression   Pt admitted with above. He demonstrates the below listed deficits and will benefit from continued OT to maximize safety and independence with BADLs.  Pt presents to OT with mild balance deficits.  He was noted to run into door and doorway on the Rt x 2 in room.  In hallway, he did not run into items, but passed very close without making correction until the last second.  No visual field deficit noted on testing.   He required increased time, and made several errors when attempting path finding activity on unit - he did not require cuing, and was able to self correct using trial and error.  Pt lives with his girlfriend and was independent with ADLs.  He does not drive, and relies on public transportation or walking.   Will continue to assess cognition.  **Pt with complaint of Lt knee pain when weightbearing/ambulating.       Follow Up Recommendations  Outpatient OT    Equipment Recommendations  None recommended by OT    Recommendations for Other Services       Precautions / Restrictions Precautions Precautions: Fall Precaution Comments: mildly unsteady - c/o Lt knee pain       Mobility Bed Mobility Overal bed mobility: Independent                Transfers Overall transfer level: Needs assistance Equipment used: None Transfers: Sit to/from [employer] Transfers Sit to Stand: Supervision Stand pivot transfers: Supervision            Balance Overall balance assessment: Mild deficits observed, not formally tested                                          ADL either performed or assessed with clinical judgement   ADL Overall ADL's : Needs assistance/impaired Eating/Feeding: Independent   Grooming: Wash/dry hands;Wash/dry face;Oral care;Supervision/safety;Standing   Upper Body Bathing: Set up;Sitting   Lower Body Bathing: Sit to/from stand;Supervison/ safety   Upper Body Dressing : Set up;Sitting   Lower Body Dressing: Supervision/safety;Sit to/from stand   Toilet Transfer: Supervision/safety;Ambulation;Comfort height toilet   Toileting- Clothing Manipulation and Hygiene: Supervision/safety;Sit to/from stand       Functional mobility during ADLs: Min guard       Vision Baseline Vision/History: Wears glasses Wears Glasses: At all times Patient Visual Report: No change from baseline Vision Assessment?: Yes Eye Alignment: Within Functional Limits Ocular Range of Motion: Within Functional Limits Alignment/Gaze Preference: Within Defined Limits Tracking/Visual Pursuits: Able to track stimulus in all quads without difficulty Saccades: Within functional limits Visual Fields: No apparent deficits;Other (comment)(see below) Additional Comments: Pt noted to almost run into bathroom door on the Rt and ran into doorframe when exiting room. He negotiated obstacles in hallway, but was very close to items on the Rt      Perception Perception Perception Tested?: Yes Comments: grazed items on his [place] tested?: Within functional limits    Pertinent Vitals/Pain Pain Assessment: Faces Faces Pain Scale:  Hurts even more Pain Location: Lt knee with weightbearing  Pain Descriptors / Indicators: Grimacing;Guarding Pain Intervention(s): Monitored during session     Hand Dominance Right   Extremity/Trunk Assessment Upper Extremity Assessment Upper Extremity Assessment: Overall WFL for tasks assessed   Lower Extremity Assessment Lower Extremity Assessment: Defer to PT evaluation   Cervical / Trunk  Assessment Cervical / Trunk Assessment: Normal;Other exceptions Cervical / Trunk Exceptions: has L5 TP fx, and pelvic fractures    Communication Communication Communication: No difficulties   Cognition Arousal/Alertness: Awake/alert Behavior During Therapy: WFL for tasks assessed/performed Overall Cognitive Status: No family/caregiver present to determine baseline cognitive functioning                                 General Comments: Pt is fully oriented.  He follows 2 step commands.  He required increased time, and trial and error to perform path finding - unsure of his baseline    General Comments  reviewed signs and symptoms of concussion/mild TBI with pt     Exercises     Shoulder Instructions      Home Living Family/patient expects to be discharged to:: Private residence Living Arrangements: Spouse/significant other Available Help at Discharge: Family;Available 24 hours/day Type of Home: Apartment Home Access: Stairs to enter Entrance Stairs-Number of Steps: 3 Entrance Stairs-Rails: Right Home Layout: One level     Bathroom Shower/Tub: Teacher, early years/pre: Standard     Home Equipment: None   Additional Comments: lives with girlfriend       Prior Functioning/Environment Level of Independence: Independent        Comments: Pt reports he is fully independent with ADLs and IADLs.  He does not work.  He does not drive, but relies on public transportation         OT Problem List: Impaired balance (sitting and/or standing);Decreased cognition;Pain      OT Treatment/Interventions: Self-care/ADL training;DME and/or AE instruction;Therapeutic activities;Patient/family education;Balance training;Cognitive remediation/compensation    OT Goals(Current goals can be found in the care plan section) Acute Rehab OT Goals Patient Stated Goal: to go home  OT Goal Formulation: With patient Time For Goal Achievement: 08/29/18 Potential to  Achieve Goals: Good ADL Goals Pt Will Perform Grooming: with modified independence;standing Pt Will Perform Upper Body Bathing: with modified independence;sitting;standing Pt Will Perform Lower Body Bathing: with modified independence;sit to/from stand Pt Will Perform Upper Body Dressing: with modified independence;sitting;standing Pt Will Perform Lower Body Dressing: with modified independence;sit to/from stand Pt Will Transfer to Toilet: with modified independence;ambulating;regular height toilet Pt Will Perform Toileting - Clothing Manipulation and hygiene: with modified independence;sit to/from stand Additional ADL Goal #1: Pt will negotiate a moderately busy environment with supervision.  OT Frequency: Min 2X/week   Barriers to D/C:            Co-evaluation              AM-PAC OT "6 Clicks" Daily Activity     Outcome Measure Help from another person eating meals?: None Help from another person taking care of personal grooming?: None Help from another person toileting, which includes using toliet, bedpan, or urinal?: A Little Help from another person bathing (including washing, rinsing, drying)?: A Little Help from another person to put on and taking off regular upper body clothing?: None Help from another person to put on and taking off regular lower body clothing?: A Little 6 Click Score: 21  End of Session Equipment Utilized During Treatment: Gait belt Nurse Communication: Mobility status  Activity Tolerance: Patient tolerated treatment well Patient left: in bed;with call bell/phone within reach;with bed alarm set  OT Visit Diagnosis: Unsteadiness on feet (R26.81);Cognitive communication deficit (R41.841)                Time: 7619-5093 OT Time Calculation (min): 25 min Charges:  OT General Charges $OT Visit: 1 Visit OT Evaluation $OT Eval Low Complexity: 1 Low OT Treatments $Therapeutic Activity: 8-22 mins  Lucille Passy, OTR/L Acute Rehabilitation  Services Pager [phone] Office [phone]   Lucille Passy M 08/15/2018, 5:18 PM

## 2018-08-15 NOTE — Progress Notes (Signed)
Subjective/Chief Complaint: No issues this am, responds appropriately follows commands   Objective: Vital signs in last 24 hours: Temp:  [97.6 F (36.4 C)-98.7 F (37.1 C)] 98.7 F (37.1 C) (02/09 0832) Pulse Rate:  [66-94] 69 (02/09 0832) Resp:  [13-20] 20 (02/09 0832) BP: (121-169)/(60-109) 138/84 (02/09 0832) SpO2:  [94 %-100 %] 100 % (02/09 0832) Weight:  [65.8 kg-66.1 kg] 66.1 kg (02/08 2336) Last BM Date: 08/14/18  Intake/Output from previous day: 02/08 0701 - 02/09 0700 In: 1000 [IV Piggyback:1000] Out: 300 [Urine:300] Intake/Output this shift: No intake/output data recorded.  General nad cv regular Lungs clear Ab soft nontender Ext without edema Neuro- a/o/ follows commands motor intact   Lab Results:  Recent Labs    08/14/18 1655 08/15/18 0256  WBC 4.4 7.5  HGB 13.0 12.2*  HCT 38.9* 36.7*  PLT 155 135*   BMET Recent Labs    08/14/18 1655 08/15/18 0256  NA 138 135  K 3.5 3.2*  CL 98 98  CO2 27 24  GLUCOSE 122* 127*  BUN 7* <5*  CREATININE 1.17 0.85  CALCIUM 9.2 8.6*   PT/INR No results for input(s): LABPROT, INR in the last 72 hours. ABG No results for input(s): PHART, HCO3 in the last 72 hours.  Invalid input(s): PCO2, PO2  Studies/Results: Ct Angio Head W Or Wo Contrast  Result Date: 08/14/2018 CLINICAL DATA:  68 year old male status post syncope and fall sustaining right side tripod fracture, trace right subdural hematoma, but also a small volume of subarachnoid hemorrhage in the right ambient cistern. EXAM: CT ANGIOGRAPHY HEAD TECHNIQUE: Multidetector CT imaging of the head was performed using the standard protocol during bolus administration of intravenous contrast. Multiplanar CT image reconstructions and MIPs were obtained to evaluate the vascular anatomy. CONTRAST:  47mL ISOVUE-370 IOPAMIDOL (ISOVUE-370) INJECTION 76% COMPARISON:  CT head face and cervical spine 1857 hours today. FINDINGS: Posterior circulation: Fairly codominant  distal vertebral arteries appear normal to the vertebrobasilar junction. The a ICAs appear dominant and their origins are patent. Mildly tortuous basilar artery with no stenosis. Normal basilar tip, SCA and PCA origins. Small right posterior communicating artery, the left is diminutive or absent. Left PCA branches are within normal limits. Right PCA branches traverse the right ambient cistern and also appear normal. Blood products are evident along the right P2 and P3 segments. Anterior circulation: Tortuous distal cervical ICAs below the skull base. Patent ICA siphons. Cavernous segment tortuosity more so on the right. Left ICA siphon mild calcified plaque without stenosis. Normal left ophthalmic artery origin. Right ICA siphon calcified plaque without stenosis. Right ophthalmic and posterior communicating artery origins are normal. Patent carotid termini. Normal MCA and ACA origins. The right A1 is dominant and the left diminutive. Anterior communicating artery and bilateral ACA branches are within normal limits. The right A2 remains dominant. Left MCA M1 segment is mildly tortuous. Left MCA bifurcation and left MCA branches are within normal limits. Right MCA M1 segment, bifurcation, and right MCA branches are within normal limits. Venous sinuses: Patent on the delayed images. Anatomic variants: Dominant right ACA. Delayed phase: Right ambient cistern subarachnoid hemorrhage redemonstrated, volume appears stable. No new basilar cistern hemorrhage. Trace right side subdural hematoma appears stable on series 15, image 16. No intracranial mass effect. No abnormal enhancement identified. Stable gray-white matter differentiation. Review of the MIP images confirms the above findings IMPRESSION: 1. Intracranial CTA is negative for aneurysm. 2. Right ambient cistern Subarachnoid Hemorrhage is stable and most likely posttraumatic. 3. Stable  trace right subdural hematoma. No intracranial mass effect. 4. ICA tortuosity and  calcified atherosclerosis of the siphons without arterial stenosis. 5. Negative posterior circulation. Electronically Signed   By: Genevie Ann M.D.   On: 08/14/2018 21:53   Dg Shoulder Right  Result Date: 08/14/2018 CLINICAL DATA:  [place] at home 1 hour ago, lateral RIGHT shoulder pain EXAM: RIGHT SHOULDER - 2+ VIEW COMPARISON:  None FINDINGS: Osseous demineralization. AC joint alignment normal. Mild glenohumeral joint space narrowing. No acute fracture, dislocation, or bone destruction. Visualized RIGHT ribs appear intact. IMPRESSION: No acute abnormalities. Electronically Signed   By: Lavonia Dana M.D.   On: 08/14/2018 17:35   Ct Head Wo Contrast  Result Date: 08/15/2018 CLINICAL DATA:  Followup hemorrhage. EXAM: CT HEAD WITHOUT CONTRAST TECHNIQUE: Contiguous axial images were obtained from the base of the skull through the vertex without intravenous contrast. COMPARISON:  Head CT from yesterday. FINDINGS: Stable area of hemorrhage near the right brainstem, likely subarachnoid hemorrhage in the ambient cistern. I do not see any residual subdural blood in the right temporal area. Stable age advanced cerebral atrophy and ventriculomegaly. No new/acute intracranial findings. Stable right-sided facial bone fractures and sinus opacification. IMPRESSION: 1. Stable area of hemorrhage along the right brainstem/ambient cistern. 2. No definite residual subdural blood in the right temporal area. 3. No new/acute findings since prior head CT. Electronically Signed   By: Marijo Sanes M.D.   On: 08/15/2018 09:35   Ct Head Wo Contrast  Addendum Date: 08/14/2018   ADDENDUM REPORT: 08/14/2018 20:06 ADDENDUM: Study discussed by telephone with Dr. Dene Gentry on 08/14/2018 at [place] hours. Electronically Signed   By: Genevie Ann M.D.   On: 08/14/2018 20:06   Result Date: 08/14/2018 CLINICAL DATA:  68 year old male with syncopal episode walking to bathroom. Pale, diaphoretic. EXAM: CT HEAD WITHOUT CONTRAST CT MAXILLOFACIAL WITHOUT  CONTRAST CT CERVICAL SPINE WITHOUT CONTRAST TECHNIQUE: Multidetector CT imaging of the head, cervical spine, and maxillofacial structures were performed using the standard protocol without intravenous contrast. Multiplanar CT image reconstructions of the cervical spine and maxillofacial structures were also generated. COMPARISON:  Head CT without contrast 01/23/2018. FINDINGS: CT HEAD FINDINGS Brain: There is a small volume of subarachnoid hemorrhage in the right ambient cistern which was normal in 2019 (series 3, images 12 and 13). However, the other basilar cisterns are spared, no other subarachnoid hemorrhage is identified. No intraventricular hemorrhage. However, there is also trace right lateral subdural hematoma measuring 3-4 millimeters in thickness on coronal image 28. Underlying calvarium appears stable and intact. No other intracranial hemorrhage identified. No intracranial mass effect. No ventriculomegaly. Stable gray-white matter differentiation throughout the brain. No cortically based acute infarct identified. Vascular: Calcified atherosclerosis at the skull base. Skull: Calvarium appears stable and intact. Other: No scalp hematoma identified. CT MAXILLOFACIAL FINDINGS Osseous: Mandible intact. Relatively poor dentition. Tripod fracture on the right with comminution of the anterior right maxillary wall, posterior right maxillary wall (series 8, image 55), posterior right zygomatic arch (image 52) and nondisplaced fracture of the right orbital floor. No definite nasal bone fracture. The left maxilla and zygoma are intact. Central skull base appears intact. Orbits: Nondisplaced fracture of the right orbital floor. Minimally displaced fracture of the right lateral orbital wall (series 8, image 63). Small volume extraconal gas within the right orbit. Mild right proptosis. No intraorbital hematoma. The right globe appears intact. The left orbital walls and soft tissues appear normal. Sinuses: Layering  hemorrhage in the right maxillary sinus. Blood and/or mucosal  thickening scattered in the right ethmoids, mildly involving the sphenoid sinuses and the left frontal sinus. Mild mucosal thickening in the left maxillary sinus. Tympanic cavities and mastoids are clear. Soft tissues: Negative visible noncontrast larynx, pharynx, parapharyngeal spaces, retropharyngeal space, sublingual space, submandibular and parotid spaces. Right side superficial face soft tissue hematoma/contusion with mild soft tissue gas overlying the tripod fracture. CT CERVICAL SPINE FINDINGS Alignment: Straightening of cervical lordosis. Cervicothoracic junction alignment is within normal limits. Bilateral posterior element alignment is within normal limits. Skull base and vertebrae: Visualized skull base is intact. No atlanto-occipital dissociation. Osteopenia. No cervical spine fracture identified. Soft tissues and spinal canal: No prevertebral fluid or swelling. No visible canal hematoma. Negative noncontrast neck soft tissues aside from calcified carotid atherosclerosis. Disc levels: Advanced lower cervical disc and endplate degeneration E7-N1 through C7-T1. Mild associated spinal stenosis suspected. Areas of vacuum disc and vacuum endplate geodes. Upper chest: Visible upper thoracic levels appear intact. Mild apical lung scarring. Negative noncontrast thoracic inlet. IMPRESSION: BRAIN: 1. Positive for small volume subarachnoid hemorrhage in the right ambient cistern. Burtis Junes this is posttraumatic (see #2), although CTA Head is suggested to exclude the possibility of intracranial aneurysm. 2. Trace right lateral Subdural Hematoma also. No underlying skull fracture. 3. No intracranial mass effect. FACE: 1. Tripod Fracture on the right. Comminuted right maxillary sinus walls and posterior right zygomatic arch. More simple and mildly displaced fractures of the right orbital floor and right lateral orbital wall. 2. Right side intraorbital gas with  mild proptosis. But no intraorbital hematoma. 3. No other facial fracture. Hemorrhage and/or mucosal thickening in the paranasal sinuses. CERVICAL SPINE: 1.  No acute osseous abnormality identified. 2. Advanced lower cervical spine degeneration with mild spinal stenosis. Electronically Signed: By: Genevie Ann M.D. On: 08/14/2018 19:52   Ct Cervical Spine Wo Contrast  Addendum Date: 08/14/2018   ADDENDUM REPORT: 08/14/2018 20:06 ADDENDUM: Study discussed by telephone with Dr. Dene Gentry on 08/14/2018 at [place] hours. Electronically Signed   By: Genevie Ann M.D.   On: 08/14/2018 20:06   Result Date: 08/14/2018 CLINICAL DATA:  68 year old male with syncopal episode walking to bathroom. Pale, diaphoretic. EXAM: CT HEAD WITHOUT CONTRAST CT MAXILLOFACIAL WITHOUT CONTRAST CT CERVICAL SPINE WITHOUT CONTRAST TECHNIQUE: Multidetector CT imaging of the head, cervical spine, and maxillofacial structures were performed using the standard protocol without intravenous contrast. Multiplanar CT image reconstructions of the cervical spine and maxillofacial structures were also generated. COMPARISON:  Head CT without contrast 01/23/2018. FINDINGS: CT HEAD FINDINGS Brain: There is a small volume of subarachnoid hemorrhage in the right ambient cistern which was normal in 2019 (series 3, images 12 and 13). However, the other basilar cisterns are spared, no other subarachnoid hemorrhage is identified. No intraventricular hemorrhage. However, there is also trace right lateral subdural hematoma measuring 3-4 millimeters in thickness on coronal image 28. Underlying calvarium appears stable and intact. No other intracranial hemorrhage identified. No intracranial mass effect. No ventriculomegaly. Stable gray-white matter differentiation throughout the brain. No cortically based acute infarct identified. Vascular: Calcified atherosclerosis at the skull base. Skull: Calvarium appears stable and intact. Other: No scalp hematoma identified. CT  MAXILLOFACIAL FINDINGS Osseous: Mandible intact. Relatively poor dentition. Tripod fracture on the right with comminution of the anterior right maxillary wall, posterior right maxillary wall (series 8, image 55), posterior right zygomatic arch (image 52) and nondisplaced fracture of the right orbital floor. No definite nasal bone fracture. The left maxilla and zygoma are intact. Central skull base appears intact. Orbits: Nondisplaced  fracture of the right orbital floor. Minimally displaced fracture of the right lateral orbital wall (series 8, image 63). Small volume extraconal gas within the right orbit. Mild right proptosis. No intraorbital hematoma. The right globe appears intact. The left orbital walls and soft tissues appear normal. Sinuses: Layering hemorrhage in the right maxillary sinus. Blood and/or mucosal thickening scattered in the right ethmoids, mildly involving the sphenoid sinuses and the left frontal sinus. Mild mucosal thickening in the left maxillary sinus. Tympanic cavities and mastoids are clear. Soft tissues: Negative visible noncontrast larynx, pharynx, parapharyngeal spaces, retropharyngeal space, sublingual space, submandibular and parotid spaces. Right side superficial face soft tissue hematoma/contusion with mild soft tissue gas overlying the tripod fracture. CT CERVICAL SPINE FINDINGS Alignment: Straightening of cervical lordosis. Cervicothoracic junction alignment is within normal limits. Bilateral posterior element alignment is within normal limits. Skull base and vertebrae: Visualized skull base is intact. No atlanto-occipital dissociation. Osteopenia. No cervical spine fracture identified. Soft tissues and spinal canal: No prevertebral fluid or swelling. No visible canal hematoma. Negative noncontrast neck soft tissues aside from calcified carotid atherosclerosis. Disc levels: Advanced lower cervical disc and endplate degeneration O1-B5 through C7-T1. Mild associated spinal stenosis  suspected. Areas of vacuum disc and vacuum endplate geodes. Upper chest: Visible upper thoracic levels appear intact. Mild apical lung scarring. Negative noncontrast thoracic inlet. IMPRESSION: BRAIN: 1. Positive for small volume subarachnoid hemorrhage in the right ambient cistern. Burtis Junes this is posttraumatic (see #2), although CTA Head is suggested to exclude the possibility of intracranial aneurysm. 2. Trace right lateral Subdural Hematoma also. No underlying skull fracture. 3. No intracranial mass effect. FACE: 1. Tripod Fracture on the right. Comminuted right maxillary sinus walls and posterior right zygomatic arch. More simple and mildly displaced fractures of the right orbital floor and right lateral orbital wall. 2. Right side intraorbital gas with mild proptosis. But no intraorbital hematoma. 3. No other facial fracture. Hemorrhage and/or mucosal thickening in the paranasal sinuses. CERVICAL SPINE: 1.  No acute osseous abnormality identified. 2. Advanced lower cervical spine degeneration with mild spinal stenosis. Electronically Signed: By: Genevie Ann M.D. On: 08/14/2018 19:52   Dg Chest Port 1 View  Result Date: 08/14/2018 CLINICAL DATA:  [place] at home 1 hour ago, congestion and productive cough for 1-2 weeks, RIGHT lateral shoulder pain, hypertension, smoker, history of pancreatitis, cirrhosis, diabetes mellitus EXAM: PORTABLE CHEST 1 VIEW COMPARISON:  Portable exam 1659 hours compared to 09/02/2015 FINDINGS: Normal heart size, mediastinal contours, and pulmonary vascularity. Probable emphysematous changes without infiltrate, pleural effusion, or pneumothorax. Bones demineralized. IMPRESSION: Probable emphysematous changes without infiltrate. Electronically Signed   By: Lavonia Dana M.D.   On: 08/14/2018 17:34   Ct Maxillofacial Wo Cm  Addendum Date: 08/14/2018   ADDENDUM REPORT: 08/14/2018 20:06 ADDENDUM: Study discussed by telephone with Dr. Dene Gentry on 08/14/2018 at [place] hours. Electronically  Signed   By: Genevie Ann M.D.   On: 08/14/2018 20:06   Result Date: 08/14/2018 CLINICAL DATA:  68 year old male with syncopal episode walking to bathroom. Pale, diaphoretic. EXAM: CT HEAD WITHOUT CONTRAST CT MAXILLOFACIAL WITHOUT CONTRAST CT CERVICAL SPINE WITHOUT CONTRAST TECHNIQUE: Multidetector CT imaging of the head, cervical spine, and maxillofacial structures were performed using the standard protocol without intravenous contrast. Multiplanar CT image reconstructions of the cervical spine and maxillofacial structures were also generated. COMPARISON:  Head CT without contrast 01/23/2018. FINDINGS: CT HEAD FINDINGS Brain: There is a small volume of subarachnoid hemorrhage in the right ambient cistern which was normal in 2019 (series  3, images 12 and 13). However, the other basilar cisterns are spared, no other subarachnoid hemorrhage is identified. No intraventricular hemorrhage. However, there is also trace right lateral subdural hematoma measuring 3-4 millimeters in thickness on coronal image 28. Underlying calvarium appears stable and intact. No other intracranial hemorrhage identified. No intracranial mass effect. No ventriculomegaly. Stable gray-white matter differentiation throughout the brain. No cortically based acute infarct identified. Vascular: Calcified atherosclerosis at the skull base. Skull: Calvarium appears stable and intact. Other: No scalp hematoma identified. CT MAXILLOFACIAL FINDINGS Osseous: Mandible intact. Relatively poor dentition. Tripod fracture on the right with comminution of the anterior right maxillary wall, posterior right maxillary wall (series 8, image 55), posterior right zygomatic arch (image 52) and nondisplaced fracture of the right orbital floor. No definite nasal bone fracture. The left maxilla and zygoma are intact. Central skull base appears intact. Orbits: Nondisplaced fracture of the right orbital floor. Minimally displaced fracture of the right lateral orbital wall  (series 8, image 63). Small volume extraconal gas within the right orbit. Mild right proptosis. No intraorbital hematoma. The right globe appears intact. The left orbital walls and soft tissues appear normal. Sinuses: Layering hemorrhage in the right maxillary sinus. Blood and/or mucosal thickening scattered in the right ethmoids, mildly involving the sphenoid sinuses and the left frontal sinus. Mild mucosal thickening in the left maxillary sinus. Tympanic cavities and mastoids are clear. Soft tissues: Negative visible noncontrast larynx, pharynx, parapharyngeal spaces, retropharyngeal space, sublingual space, submandibular and parotid spaces. Right side superficial face soft tissue hematoma/contusion with mild soft tissue gas overlying the tripod fracture. CT CERVICAL SPINE FINDINGS Alignment: Straightening of cervical lordosis. Cervicothoracic junction alignment is within normal limits. Bilateral posterior element alignment is within normal limits. Skull base and vertebrae: Visualized skull base is intact. No atlanto-occipital dissociation. Osteopenia. No cervical spine fracture identified. Soft tissues and spinal canal: No prevertebral fluid or swelling. No visible canal hematoma. Negative noncontrast neck soft tissues aside from calcified carotid atherosclerosis. Disc levels: Advanced lower cervical disc and endplate degeneration X7-L3 through C7-T1. Mild associated spinal stenosis suspected. Areas of vacuum disc and vacuum endplate geodes. Upper chest: Visible upper thoracic levels appear intact. Mild apical lung scarring. Negative noncontrast thoracic inlet. IMPRESSION: BRAIN: 1. Positive for small volume subarachnoid hemorrhage in the right ambient cistern. Burtis Junes this is posttraumatic (see #2), although CTA Head is suggested to exclude the possibility of intracranial aneurysm. 2. Trace right lateral Subdural Hematoma also. No underlying skull fracture. 3. No intracranial mass effect. FACE: 1. Tripod Fracture  on the right. Comminuted right maxillary sinus walls and posterior right zygomatic arch. More simple and mildly displaced fractures of the right orbital floor and right lateral orbital wall. 2. Right side intraorbital gas with mild proptosis. But no intraorbital hematoma. 3. No other facial fracture. Hemorrhage and/or mucosal thickening in the paranasal sinuses. CERVICAL SPINE: 1.  No acute osseous abnormality identified. 2. Advanced lower cervical spine degeneration with mild spinal stenosis. Electronically Signed: By: Genevie Ann M.D. On: 08/14/2018 19:52    Anti-infectives: Anti-infectives (From admission, onward)   None      Assessment/Plan: 68 yo male with fall from standing height SDH- nsurg following nothing more needed at this time Orbital floor fracture-ent consult, f/u Dr Benjamine Mola FEN- advance diet as tolerated proph- mechanical for now etoh- ciwa protocol dispo- pt/ot  Rolm Bookbinder 08/15/2018

## 2018-08-15 NOTE — Progress Notes (Signed)
Pt arrived from 4n ICU where he was only briefly. Pt alert and oriented. Able to move all extremeities and denies numbness. Pt denies having valuables states he only has clothes. Wife/Girlfriend called and talked to patient.

## 2018-08-16 ENCOUNTER — Inpatient Hospital Stay (HOSPITAL_COMMUNITY): Payer: Medicare Other

## 2018-08-16 MED ORDER — LEVETIRACETAM 500 MG PO TABS: 500.0000 mg | ORAL_TABLET | Freq: Two times a day (BID) | ORAL | 0 refills | Status: DC

## 2018-08-16 NOTE — Discharge Instructions (Signed)
Head Injury, Adult  There are many types of head injuries. Head injuries can be as minor as a bump, or they can be more severe. More severe head injuries include:  · A jarring injury to the brain (concussion).  · A bruise of the brain (contusion). This means there is bleeding in the brain that can cause swelling.  · A cracked skull (skull fracture).  · Bleeding in the brain that collects, clots, and forms a bump (hematoma).  After a head injury, you may need to be observed for a while in the emergency department or urgent care. Sometimes admission to the hospital is needed.  After a head injury has happened, most problems occur within the first 24 hours, but side effects may occur up to 7-10 days after the injury. It is important to watch your condition for any changes.  What are the causes?  There are many possible causes of a head injury. A serious head injury may happen to someone who is in a car accident (motor vehicle collision). Other causes of major head injuries include bicycle or motorcycle accidents, sports injuries, and falls.  Risk factors  This condition is more likely to occur in people who:  · Drink a lot of alcohol or use drugs.  · Are over the age of 65.  · Are at risk for falls.  What are the symptoms?  There are many possible symptoms of a head injury. Visible symptoms of a head injury include a bruise, bump, or bleeding at the site of the injury. Other non-visible symptoms include:  · Feeling sleepy or not being able to stay awake.  · Passing out.  · Headache.  · Seizures.  · Dizziness.  · Confusion.  · Memory problems.  · Nausea or vomiting.  Other possible symptoms that may develop after the head injury include:  · Poor attention and concentration.  · Fatigue or tiring easily.  · Irritability.  · Being uncomfortable around bright lights or loud noises.  · Anxiety or depression.  · Disturbed sleep.  How is this diagnosed?  This condition can usually be diagnosed based on your symptoms, a  description of the injury, and a physical exam. You may also have imaging tests done, such as a CT scan or MRI. You will also be closely watched.  How is this treated?  Treatment for this condition depends on the severity and type of injury you have. The main goal of treatment is to prevent complications and allow the brain time to heal.  For mild head injury, you may be sent home and treatment may include:  · Observation. A responsible adult should stay with you for 24 hours after your injury and check on you often.  · Physical rest.  · Brain rest.  · Pain medicines.  For severe brain injury, treatment may include:  · Close observation. This includes hospitalization with frequent physical exams. You may need to go to a hospital that specializes in head injury.  · Pain medicines.  · Breathing support. This may include using a ventilator.  · Managing the pressure inside the brain (intracranial pressure, or ICP). This may include:  ? Monitoring the ICP.  ? Giving medicines to decrease the ICP.  ? Positioning you to decrease the ICP.  · Medicine to prevent seizures.  · Surgery to stop bleeding or to remove blood clots (craniotomy).  · Surgery to remove part of the skull (decompressive craniectomy). This allows room for the brain to swell.    Follow these instructions at home:  Activity  · Rest as much as possible and avoid activities that are physically hard or tiring.  · Make sure you get enough sleep.  · Limit activities that require a lot of thought or attention, such as:  ? Watching TV.  ? Playing memory games and puzzles.  ? Job-related work or homework.  ? Working on the computer, social media, and texting.  · Avoid activities that could cause another head injury, such as playing sports, until your health care provider approves. Having another head injury, especially before the first one has healed, can be dangerous.  · Ask your health care provider when it is safe for you to return to your regular activities,  including work or school. Ask your health care provider for a step-by-step plan for gradually returning to activities.  · Ask your health care provider when you can drive, ride a bicycle, or use heavy machinery. Your ability to react may be slower after a brain injury. Never do these activities if you are dizzy.  Lifestyle  · Do not drink alcohol until your health care provider approves, and avoid drug use. Alcohol and certain drugs may slow your recovery and can put you at risk of further injury.  · If it is harder than usual to remember things, write them down.  · If you are easily distracted, try to do one thing at a time.  · Talk with family members or close friends when making important decisions.  · Tell your friends, family, a trusted colleague, and work manager about your injury, symptoms, and restrictions. Have them watch for any new or worsening problems.  General instructions  · Take over-the-counter and prescription medicines only as told by your health care provider.  · Have someone stay with you for 24 hours after your head injury. This person should watch you for any changes in your symptoms and be ready to seek medical help, as needed.  · Keep all follow-up visits as told by your health care provider. This is important.  How is this prevented?  · Work on improving your balance and strength to avoid falls.  · Wear a seatbelt when you are in a moving vehicle.  · Wear a helmet when riding a bicycle, skiing, or doing any other sport or activity that has a risk of injury.  · Drink alcohol only in moderation.  · Take safety measures in your home, such as:  ? Removing clutter and tripping hazards from floors and stairways.  ? Using grab bars in bathrooms and handrails by stairs.  ? Placing non-slip mats on floors and in bathtubs.  ? Improving lighting in dim areas.  Get help right away if:  · You have:  ? A severe headache that is not helped by medicine.  ? Trouble walking, have weakness in your arms and  legs, or lose your balance.  ? Clear or bloody fluid coming from your nose or ears.  ? Changes in your vision.  ? A seizure.  · You vomit.  · Your symptoms get worse.  · Your speech is slurred.  · You pass out.  · You are sleepier and have trouble staying awake.  · Your pupils change size.  These symptoms may represent a serious problem that is an emergency. Do not wait to see if the symptoms will go away. Get medical help right away. Call your local emergency services (911 in the [place] Do not drive yourself to the

## 2018-08-16 NOTE — Progress Notes (Signed)
Subjective: CC: Left knee pain Had some left knee pain yesterday when working with OT. Resolved with tylenol. Has no reoccurred. Unsure if he had issues with the knee prior to fall. Tolerating regular diet. Wants to go home. No other complaints.    Objective: Vital signs in last 24 hours: Temp:  [98.1 F (36.7 C)-98.7 F (37.1 C)] 98.1 F (36.7 C) (02/10 0300) Pulse Rate:  [64-87] 77 (02/10 0807) Resp:  [15-24] 20 (02/10 0807) BP: (96-138)/(56-84) 107/66 (02/10 0807) SpO2:  [95 %-100 %] 96 % (02/10 0807) Last BM Date: 08/14/18  Intake/Output from previous day: 02/09 0701 - 02/10 0700 In: -  Out: 350 [Urine:350] Intake/Output this shift: No intake/output data recorded.  PE: Gen: Awake and alert. A&O x 3.  HEENT: Small laceration repaired with dermabond to the right cheek. No surrounding evidence of infection. No trismus. Heart: RRR Lungs: CTA b/l Msk: Moves all extremities without difficulty. No tenderness to the left knee. No deformities. Skin intact. Normal passive and active rom. Normal strength. Negative lachman's. No edema. Neuro: Moves all extremities. Normal phonation. Follows commands. CNs 3-12 grossly intact.  Skin: No rashes  Lab Results:  Recent Labs    08/14/18 1655 08/15/18 0256  WBC 4.4 7.5  HGB 13.0 12.2*  HCT 38.9* 36.7*  PLT 155 135*   BMET Recent Labs    08/14/18 1655 08/15/18 0256  NA 138 135  K 3.5 3.2*  CL 98 98  CO2 27 24  GLUCOSE 122* 127*  BUN 7* <5*  CREATININE 1.17 0.85  CALCIUM 9.2 8.6*   PT/INR No results for input(s): LABPROT, INR in the last 72 hours. CMP     Component Value Date/Time   NA 135 08/15/2018 0256   K 3.2 (L) 08/15/2018 0256   CL 98 08/15/2018 0256   CO2 24 08/15/2018 0256   GLUCOSE 127 (H) 08/15/2018 0256   BUN <5 (L) 08/15/2018 0256   CREATININE 0.85 08/15/2018 0256   CALCIUM 8.6 (L) 08/15/2018 0256   PROT 7.0 10/22/2015 1441   ALBUMIN 3.2 (L) 10/22/2015 1441   AST 28 10/22/2015 1441   ALT 22  10/22/2015 1441   ALKPHOS 79 10/22/2015 1441   BILITOT 0.3 10/22/2015 1441   GFRNONAA >60 08/15/2018 0256   GFRAA >60 08/15/2018 0256   Lipase     Component Value Date/Time   LIPASE 99 (H) 09/02/2015 0920       Studies/Results: Ct Angio Head W Or Wo Contrast  Result Date: 08/14/2018 CLINICAL DATA:  68 year old male status post syncope and fall sustaining right side tripod fracture, trace right subdural hematoma, but also a small volume of subarachnoid hemorrhage in the right ambient cistern. EXAM: CT ANGIOGRAPHY HEAD TECHNIQUE: Multidetector CT imaging of the head was performed using the standard protocol during bolus administration of intravenous contrast. Multiplanar CT image reconstructions and MIPs were obtained to evaluate the vascular anatomy. CONTRAST:  47mL ISOVUE-370 IOPAMIDOL (ISOVUE-370) INJECTION 76% COMPARISON:  CT head face and cervical spine 1857 hours today. FINDINGS: Posterior circulation: Fairly codominant distal vertebral arteries appear normal to the vertebrobasilar junction. The a ICAs appear dominant and their origins are patent. Mildly tortuous basilar artery with no stenosis. Normal basilar tip, SCA and PCA origins. Small right posterior communicating artery, the left is diminutive or absent. Left PCA branches are within normal limits. Right PCA branches traverse the right ambient cistern and also appear normal. Blood products are evident along the right P2 and P3 segments. Anterior  circulation: Tortuous distal cervical ICAs below the skull base. Patent ICA siphons. Cavernous segment tortuosity more so on the right. Left ICA siphon mild calcified plaque without stenosis. Normal left ophthalmic artery origin. Right ICA siphon calcified plaque without stenosis. Right ophthalmic and posterior communicating artery origins are normal. Patent carotid termini. Normal MCA and ACA origins. The right A1 is dominant and the left diminutive. Anterior communicating artery and bilateral  ACA branches are within normal limits. The right A2 remains dominant. Left MCA M1 segment is mildly tortuous. Left MCA bifurcation and left MCA branches are within normal limits. Right MCA M1 segment, bifurcation, and right MCA branches are within normal limits. Venous sinuses: Patent on the delayed images. Anatomic variants: Dominant right ACA. Delayed phase: Right ambient cistern subarachnoid hemorrhage redemonstrated, volume appears stable. No new basilar cistern hemorrhage. Trace right side subdural hematoma appears stable on series 15, image 16. No intracranial mass effect. No abnormal enhancement identified. Stable gray-white matter differentiation. Review of the MIP images confirms the above findings IMPRESSION: 1. Intracranial CTA is negative for aneurysm. 2. Right ambient cistern Subarachnoid Hemorrhage is stable and most likely posttraumatic. 3. Stable trace right subdural hematoma. No intracranial mass effect. 4. ICA tortuosity and calcified atherosclerosis of the siphons without arterial stenosis. 5. Negative posterior circulation. Electronically Signed   By: Genevie Ann M.D.   On: 08/14/2018 21:53   Dg Shoulder Right  Result Date: 08/14/2018 CLINICAL DATA:  [place] at home 1 hour ago, lateral RIGHT shoulder pain EXAM: RIGHT SHOULDER - 2+ VIEW COMPARISON:  None FINDINGS: Osseous demineralization. AC joint alignment normal. Mild glenohumeral joint space narrowing. No acute fracture, dislocation, or bone destruction. Visualized RIGHT ribs appear intact. IMPRESSION: No acute abnormalities. Electronically Signed   By: Lavonia Dana M.D.   On: 08/14/2018 17:35   Ct Head Wo Contrast  Result Date: 08/15/2018 CLINICAL DATA:  Followup hemorrhage. EXAM: CT HEAD WITHOUT CONTRAST TECHNIQUE: Contiguous axial images were obtained from the base of the skull through the vertex without intravenous contrast. COMPARISON:  Head CT from yesterday. FINDINGS: Stable area of hemorrhage near the right brainstem, likely subarachnoid  hemorrhage in the ambient cistern. I do not see any residual subdural blood in the right temporal area. Stable age advanced cerebral atrophy and ventriculomegaly. No new/acute intracranial findings. Stable right-sided facial bone fractures and sinus opacification. IMPRESSION: 1. Stable area of hemorrhage along the right brainstem/ambient cistern. 2. No definite residual subdural blood in the right temporal area. 3. No new/acute findings since prior head CT. Electronically Signed   By: Marijo Sanes M.D.   On: 08/15/2018 09:35   Ct Head Wo Contrast  Addendum Date: 08/14/2018   ADDENDUM REPORT: 08/14/2018 20:06 ADDENDUM: Study discussed by telephone with Dr. Dene Gentry on 08/14/2018 at [place] hours. Electronically Signed   By: Genevie Ann M.D.   On: 08/14/2018 20:06   Result Date: 08/14/2018 CLINICAL DATA:  68 year old male with syncopal episode walking to bathroom. Pale, diaphoretic. EXAM: CT HEAD WITHOUT CONTRAST CT MAXILLOFACIAL WITHOUT CONTRAST CT CERVICAL SPINE WITHOUT CONTRAST TECHNIQUE: Multidetector CT imaging of the head, cervical spine, and maxillofacial structures were performed using the standard protocol without intravenous contrast. Multiplanar CT image reconstructions of the cervical spine and maxillofacial structures were also generated. COMPARISON:  Head CT without contrast 01/23/2018. FINDINGS: CT HEAD FINDINGS Brain: There is a small volume of subarachnoid hemorrhage in the right ambient cistern which was normal in 2019 (series 3, images 12 and 13). However, the other basilar cisterns are spared, no  other subarachnoid hemorrhage is identified. No intraventricular hemorrhage. However, there is also trace right lateral subdural hematoma measuring 3-4 millimeters in thickness on coronal image 28. Underlying calvarium appears stable and intact. No other intracranial hemorrhage identified. No intracranial mass effect. No ventriculomegaly. Stable gray-white matter differentiation throughout the brain. No  cortically based acute infarct identified. Vascular: Calcified atherosclerosis at the skull base. Skull: Calvarium appears stable and intact. Other: No scalp hematoma identified. CT MAXILLOFACIAL FINDINGS Osseous: Mandible intact. Relatively poor dentition. Tripod fracture on the right with comminution of the anterior right maxillary wall, posterior right maxillary wall (series 8, image 55), posterior right zygomatic arch (image 52) and nondisplaced fracture of the right orbital floor. No definite nasal bone fracture. The left maxilla and zygoma are intact. Central skull base appears intact. Orbits: Nondisplaced fracture of the right orbital floor. Minimally displaced fracture of the right lateral orbital wall (series 8, image 63). Small volume extraconal gas within the right orbit. Mild right proptosis. No intraorbital hematoma. The right globe appears intact. The left orbital walls and soft tissues appear normal. Sinuses: Layering hemorrhage in the right maxillary sinus. Blood and/or mucosal thickening scattered in the right ethmoids, mildly involving the sphenoid sinuses and the left frontal sinus. Mild mucosal thickening in the left maxillary sinus. Tympanic cavities and mastoids are clear. Soft tissues: Negative visible noncontrast larynx, pharynx, parapharyngeal spaces, retropharyngeal space, sublingual space, submandibular and parotid spaces. Right side superficial face soft tissue hematoma/contusion with mild soft tissue gas overlying the tripod fracture. CT CERVICAL SPINE FINDINGS Alignment: Straightening of cervical lordosis. Cervicothoracic junction alignment is within normal limits. Bilateral posterior element alignment is within normal limits. Skull base and vertebrae: Visualized skull base is intact. No atlanto-occipital dissociation. Osteopenia. No cervical spine fracture identified. Soft tissues and spinal canal: No prevertebral fluid or swelling. No visible canal hematoma. Negative noncontrast neck  soft tissues aside from calcified carotid atherosclerosis. Disc levels: Advanced lower cervical disc and endplate degeneration T2-I7 through C7-T1. Mild associated spinal stenosis suspected. Areas of vacuum disc and vacuum endplate geodes. Upper chest: Visible upper thoracic levels appear intact. Mild apical lung scarring. Negative noncontrast thoracic inlet. IMPRESSION: BRAIN: 1. Positive for small volume subarachnoid hemorrhage in the right ambient cistern. Burtis Junes this is posttraumatic (see #2), although CTA Head is suggested to exclude the possibility of intracranial aneurysm. 2. Trace right lateral Subdural Hematoma also. No underlying skull fracture. 3. No intracranial mass effect. FACE: 1. Tripod Fracture on the right. Comminuted right maxillary sinus walls and posterior right zygomatic arch. More simple and mildly displaced fractures of the right orbital floor and right lateral orbital wall. 2. Right side intraorbital gas with mild proptosis. But no intraorbital hematoma. 3. No other facial fracture. Hemorrhage and/or mucosal thickening in the paranasal sinuses. CERVICAL SPINE: 1.  No acute osseous abnormality identified. 2. Advanced lower cervical spine degeneration with mild spinal stenosis. Electronically Signed: By: Genevie Ann M.D. On: 08/14/2018 19:52   Ct Cervical Spine Wo Contrast  Addendum Date: 08/14/2018   ADDENDUM REPORT: 08/14/2018 20:06 ADDENDUM: Study discussed by telephone with Dr. Dene Gentry on 08/14/2018 at [place] hours. Electronically Signed   By: Genevie Ann M.D.   On: 08/14/2018 20:06   Result Date: 08/14/2018 CLINICAL DATA:  68 year old male with syncopal episode walking to bathroom. Pale, diaphoretic. EXAM: CT HEAD WITHOUT CONTRAST CT MAXILLOFACIAL WITHOUT CONTRAST CT CERVICAL SPINE WITHOUT CONTRAST TECHNIQUE: Multidetector CT imaging of the head, cervical spine, and maxillofacial structures were performed using the standard protocol without intravenous contrast.  Multiplanar CT image  reconstructions of the cervical spine and maxillofacial structures were also generated. COMPARISON:  Head CT without contrast 01/23/2018. FINDINGS: CT HEAD FINDINGS Brain: There is a small volume of subarachnoid hemorrhage in the right ambient cistern which was normal in 2019 (series 3, images 12 and 13). However, the other basilar cisterns are spared, no other subarachnoid hemorrhage is identified. No intraventricular hemorrhage. However, there is also trace right lateral subdural hematoma measuring 3-4 millimeters in thickness on coronal image 28. Underlying calvarium appears stable and intact. No other intracranial hemorrhage identified. No intracranial mass effect. No ventriculomegaly. Stable gray-white matter differentiation throughout the brain. No cortically based acute infarct identified. Vascular: Calcified atherosclerosis at the skull base. Skull: Calvarium appears stable and intact. Other: No scalp hematoma identified. CT MAXILLOFACIAL FINDINGS Osseous: Mandible intact. Relatively poor dentition. Tripod fracture on the right with comminution of the anterior right maxillary wall, posterior right maxillary wall (series 8, image 55), posterior right zygomatic arch (image 52) and nondisplaced fracture of the right orbital floor. No definite nasal bone fracture. The left maxilla and zygoma are intact. Central skull base appears intact. Orbits: Nondisplaced fracture of the right orbital floor. Minimally displaced fracture of the right lateral orbital wall (series 8, image 63). Small volume extraconal gas within the right orbit. Mild right proptosis. No intraorbital hematoma. The right globe appears intact. The left orbital walls and soft tissues appear normal. Sinuses: Layering hemorrhage in the right maxillary sinus. Blood and/or mucosal thickening scattered in the right ethmoids, mildly involving the sphenoid sinuses and the left frontal sinus. Mild mucosal thickening in the left maxillary sinus. Tympanic  cavities and mastoids are clear. Soft tissues: Negative visible noncontrast larynx, pharynx, parapharyngeal spaces, retropharyngeal space, sublingual space, submandibular and parotid spaces. Right side superficial face soft tissue hematoma/contusion with mild soft tissue gas overlying the tripod fracture. CT CERVICAL SPINE FINDINGS Alignment: Straightening of cervical lordosis. Cervicothoracic junction alignment is within normal limits. Bilateral posterior element alignment is within normal limits. Skull base and vertebrae: Visualized skull base is intact. No atlanto-occipital dissociation. Osteopenia. No cervical spine fracture identified. Soft tissues and spinal canal: No prevertebral fluid or swelling. No visible canal hematoma. Negative noncontrast neck soft tissues aside from calcified carotid atherosclerosis. Disc levels: Advanced lower cervical disc and endplate degeneration O3-Z8 through C7-T1. Mild associated spinal stenosis suspected. Areas of vacuum disc and vacuum endplate geodes. Upper chest: Visible upper thoracic levels appear intact. Mild apical lung scarring. Negative noncontrast thoracic inlet. IMPRESSION: BRAIN: 1. Positive for small volume subarachnoid hemorrhage in the right ambient cistern. Burtis Junes this is posttraumatic (see #2), although CTA Head is suggested to exclude the possibility of intracranial aneurysm. 2. Trace right lateral Subdural Hematoma also. No underlying skull fracture. 3. No intracranial mass effect. FACE: 1. Tripod Fracture on the right. Comminuted right maxillary sinus walls and posterior right zygomatic arch. More simple and mildly displaced fractures of the right orbital floor and right lateral orbital wall. 2. Right side intraorbital gas with mild proptosis. But no intraorbital hematoma. 3. No other facial fracture. Hemorrhage and/or mucosal thickening in the paranasal sinuses. CERVICAL SPINE: 1.  No acute osseous abnormality identified. 2. Advanced lower cervical spine  degeneration with mild spinal stenosis. Electronically Signed: By: Genevie Ann M.D. On: 08/14/2018 19:52   Dg Chest Port 1 View  Result Date: 08/14/2018 CLINICAL DATA:  [place] at home 1 hour ago, congestion and productive cough for 1-2 weeks, RIGHT lateral shoulder pain, hypertension, smoker, history of pancreatitis, cirrhosis, diabetes mellitus  EXAM: PORTABLE CHEST 1 VIEW COMPARISON:  Portable exam 1659 hours compared to 09/02/2015 FINDINGS: Normal heart size, mediastinal contours, and pulmonary vascularity. Probable emphysematous changes without infiltrate, pleural effusion, or pneumothorax. Bones demineralized. IMPRESSION: Probable emphysematous changes without infiltrate. Electronically Signed   By: Lavonia Dana M.D.   On: 08/14/2018 17:34   Ct Maxillofacial Wo Cm  Addendum Date: 08/14/2018   ADDENDUM REPORT: 08/14/2018 20:06 ADDENDUM: Study discussed by telephone with Dr. Dene Gentry on 08/14/2018 at [place] hours. Electronically Signed   By: Genevie Ann M.D.   On: 08/14/2018 20:06   Result Date: 08/14/2018 CLINICAL DATA:  68 year old male with syncopal episode walking to bathroom. Pale, diaphoretic. EXAM: CT HEAD WITHOUT CONTRAST CT MAXILLOFACIAL WITHOUT CONTRAST CT CERVICAL SPINE WITHOUT CONTRAST TECHNIQUE: Multidetector CT imaging of the head, cervical spine, and maxillofacial structures were performed using the standard protocol without intravenous contrast. Multiplanar CT image reconstructions of the cervical spine and maxillofacial structures were also generated. COMPARISON:  Head CT without contrast 01/23/2018. FINDINGS: CT HEAD FINDINGS Brain: There is a small volume of subarachnoid hemorrhage in the right ambient cistern which was normal in 2019 (series 3, images 12 and 13). However, the other basilar cisterns are spared, no other subarachnoid hemorrhage is identified. No intraventricular hemorrhage. However, there is also trace right lateral subdural hematoma measuring 3-4 millimeters in thickness on coronal  image 28. Underlying calvarium appears stable and intact. No other intracranial hemorrhage identified. No intracranial mass effect. No ventriculomegaly. Stable gray-white matter differentiation throughout the brain. No cortically based acute infarct identified. Vascular: Calcified atherosclerosis at the skull base. Skull: Calvarium appears stable and intact. Other: No scalp hematoma identified. CT MAXILLOFACIAL FINDINGS Osseous: Mandible intact. Relatively poor dentition. Tripod fracture on the right with comminution of the anterior right maxillary wall, posterior right maxillary wall (series 8, image 55), posterior right zygomatic arch (image 52) and nondisplaced fracture of the right orbital floor. No definite nasal bone fracture. The left maxilla and zygoma are intact. Central skull base appears intact. Orbits: Nondisplaced fracture of the right orbital floor. Minimally displaced fracture of the right lateral orbital wall (series 8, image 63). Small volume extraconal gas within the right orbit. Mild right proptosis. No intraorbital hematoma. The right globe appears intact. The left orbital walls and soft tissues appear normal. Sinuses: Layering hemorrhage in the right maxillary sinus. Blood and/or mucosal thickening scattered in the right ethmoids, mildly involving the sphenoid sinuses and the left frontal sinus. Mild mucosal thickening in the left maxillary sinus. Tympanic cavities and mastoids are clear. Soft tissues: Negative visible noncontrast larynx, pharynx, parapharyngeal spaces, retropharyngeal space, sublingual space, submandibular and parotid spaces. Right side superficial face soft tissue hematoma/contusion with mild soft tissue gas overlying the tripod fracture. CT CERVICAL SPINE FINDINGS Alignment: Straightening of cervical lordosis. Cervicothoracic junction alignment is within normal limits. Bilateral posterior element alignment is within normal limits. Skull base and vertebrae: Visualized skull  base is intact. No atlanto-occipital dissociation. Osteopenia. No cervical spine fracture identified. Soft tissues and spinal canal: No prevertebral fluid or swelling. No visible canal hematoma. Negative noncontrast neck soft tissues aside from calcified carotid atherosclerosis. Disc levels: Advanced lower cervical disc and endplate degeneration Z6-X0 through C7-T1. Mild associated spinal stenosis suspected. Areas of vacuum disc and vacuum endplate geodes. Upper chest: Visible upper thoracic levels appear intact. Mild apical lung scarring. Negative noncontrast thoracic inlet. IMPRESSION: BRAIN: 1. Positive for small volume subarachnoid hemorrhage in the right ambient cistern. Burtis Junes this is posttraumatic (see #2), although CTA Head is  suggested to exclude the possibility of intracranial aneurysm. 2. Trace right lateral Subdural Hematoma also. No underlying skull fracture. 3. No intracranial mass effect. FACE: 1. Tripod Fracture on the right. Comminuted right maxillary sinus walls and posterior right zygomatic arch. More simple and mildly displaced fractures of the right orbital floor and right lateral orbital wall. 2. Right side intraorbital gas with mild proptosis. But no intraorbital hematoma. 3. No other facial fracture. Hemorrhage and/or mucosal thickening in the paranasal sinuses. CERVICAL SPINE: 1.  No acute osseous abnormality identified. 2. Advanced lower cervical spine degeneration with mild spinal stenosis. Electronically Signed: By: Genevie Ann M.D. On: 08/14/2018 19:52    Anti-infectives: Anti-infectives (From admission, onward)   None       Assessment/Plan 68 yo male with fall from standing height Right [facility]-Neurosurgery following. Started on Keppra BID x 7d. Nothing more needed at this time. PRN follow up as outpt. [facility] - Nsurg following. CTA negative. Repeat CT head on 2/9 stable. Keppra per Nsurg as above.  Right Tripod Fx/Orbital floor fracture- ENT consulted. No plans for surgical  intervention. F/u Dr Benjamine Mola 1 wk after d/c Etoh abuse - ciwa protocol Left knee pain - Benign exam. Follow up on xray.  FEN- Regular VTE-SCDs, will hold off on Lovenox for now ID - None  Dispo-Pending PT eval. OT recommended outpt OT at d/c; no equipment recommendations.    LOS: 1 day    Jillyn Ledger , [facility] Surgery 08/16/2018, 8:21 AM Pager: [phone]

## 2018-08-16 NOTE — Discharge Summary (Signed)
[facility] Discharge Summary   Patient ID: Alexander Schmitt MRN: [id number] DOB/AGE: 07-10-1950 68 y.o.  Admit date: 08/14/2018 Discharge date: 08/16/2018  Admitting Diagnosis: Fall from standing height SDH Orbital floor fracture  Discharge Diagnosis Patient Active Problem List   Diagnosis Date Noted  . SDH (subdural hematoma) ([place]) 08/14/2018  . Benign neoplasm of ascending colon   . Abdominal discomfort   . Melena   . Splenic vein thrombosis   . Asplenia   . Abnormal CT of the abdomen   . Gastric varices   . Pancreatic mass   . Acute blood loss anemia   . UGI bleed 09/02/2015  . Alcohol abuse 09/02/2015  . Abdominal pain in male   . Protein-calorie malnutrition, severe ([place]) 02/04/2014  . Alcoholic pancreatitis 28/76/8115  . Malnutrition of moderate degree ([place]) 05/04/2013  . Pancreatic pseudocyst 05/04/2013  . Alcohol abuse, in remission 05/03/2013  . Right inguinal hernia 09/20/2012  . Umbilical hernia 72/62/0355  . Ascites 05/24/2012  . Cirrhosis ([place]) 05/24/2012  . Chronic calcific pancreatitis [facility]) 05/24/2012    Consultants Neurosurgery ENT  Imaging: Ct Angio Head W Or Wo Contrast  Result Date: 08/14/2018 CLINICAL DATA:  68 year old male status post syncope and fall sustaining right side tripod fracture, trace right subdural hematoma, but also a small volume of subarachnoid hemorrhage in the right ambient cistern. EXAM: CT ANGIOGRAPHY HEAD TECHNIQUE: Multidetector CT imaging of the head was performed using the standard protocol during bolus administration of intravenous contrast. Multiplanar CT image reconstructions and MIPs were obtained to evaluate the vascular anatomy. CONTRAST:  55mL ISOVUE-370 IOPAMIDOL (ISOVUE-370) INJECTION 76% COMPARISON:  CT head face and cervical spine 1857 hours today. FINDINGS: Posterior circulation: Fairly codominant distal vertebral arteries appear normal to the vertebrobasilar junction. The a ICAs appear dominant and  their origins are patent. Mildly tortuous basilar artery with no stenosis. Normal basilar tip, SCA and PCA origins. Small right posterior communicating artery, the left is diminutive or absent. Left PCA branches are within normal limits. Right PCA branches traverse the right ambient cistern and also appear normal. Blood products are evident along the right P2 and P3 segments. Anterior circulation: Tortuous distal cervical ICAs below the skull base. Patent ICA siphons. Cavernous segment tortuosity more so on the right. Left ICA siphon mild calcified plaque without stenosis. Normal left ophthalmic artery origin. Right ICA siphon calcified plaque without stenosis. Right ophthalmic and posterior communicating artery origins are normal. Patent carotid termini. Normal MCA and ACA origins. The right A1 is dominant and the left diminutive. Anterior communicating artery and bilateral ACA branches are within normal limits. The right A2 remains dominant. Left MCA M1 segment is mildly tortuous. Left MCA bifurcation and left MCA branches are within normal limits. Right MCA M1 segment, bifurcation, and right MCA branches are within normal limits. Venous sinuses: Patent on the delayed images. Anatomic variants: Dominant right ACA. Delayed phase: Right ambient cistern subarachnoid hemorrhage redemonstrated, volume appears stable. No new basilar cistern hemorrhage. Trace right side subdural hematoma appears stable on series 15, image 16. No intracranial mass effect. No abnormal enhancement identified. Stable gray-white matter differentiation. Review of the MIP images confirms the above findings IMPRESSION: 1. Intracranial CTA is negative for aneurysm. 2. Right ambient cistern Subarachnoid Hemorrhage is stable and most likely posttraumatic. 3. Stable trace right subdural hematoma. No intracranial mass effect. 4. ICA tortuosity and calcified atherosclerosis of the siphons without arterial stenosis. 5. Negative posterior circulation.  Electronically Signed   By: Herminio Heads.D.  On: 08/14/2018 21:53   Dg Shoulder Right  Result Date: 08/14/2018 CLINICAL DATA:  [place] at home 1 hour ago, lateral RIGHT shoulder pain EXAM: RIGHT SHOULDER - 2+ VIEW COMPARISON:  None FINDINGS: Osseous demineralization. AC joint alignment normal. Mild glenohumeral joint space narrowing. No acute fracture, dislocation, or bone destruction. Visualized RIGHT ribs appear intact. IMPRESSION: No acute abnormalities. Electronically Signed   By: Lavonia Dana M.D.   On: 08/14/2018 17:35   Ct Head Wo Contrast  Result Date: 08/15/2018 CLINICAL DATA:  Followup hemorrhage. EXAM: CT HEAD WITHOUT CONTRAST TECHNIQUE: Contiguous axial images were obtained from the base of the skull through the vertex without intravenous contrast. COMPARISON:  Head CT from yesterday. FINDINGS: Stable area of hemorrhage near the right brainstem, likely subarachnoid hemorrhage in the ambient cistern. I do not see any residual subdural blood in the right temporal area. Stable age advanced cerebral atrophy and ventriculomegaly. No new/acute intracranial findings. Stable right-sided facial bone fractures and sinus opacification. IMPRESSION: 1. Stable area of hemorrhage along the right brainstem/ambient cistern. 2. No definite residual subdural blood in the right temporal area. 3. No new/acute findings since prior head CT. Electronically Signed   By: Marijo Sanes M.D.   On: 08/15/2018 09:35   Ct Head Wo Contrast  Addendum Date: 08/14/2018   ADDENDUM REPORT: 08/14/2018 20:06 ADDENDUM: Study discussed by telephone with Dr. Dene Gentry on 08/14/2018 at [place] hours. Electronically Signed   By: Genevie Ann M.D.   On: 08/14/2018 20:06   Result Date: 08/14/2018 CLINICAL DATA:  68 year old male with syncopal episode walking to bathroom. Pale, diaphoretic. EXAM: CT HEAD WITHOUT CONTRAST CT MAXILLOFACIAL WITHOUT CONTRAST CT CERVICAL SPINE WITHOUT CONTRAST TECHNIQUE: Multidetector CT imaging of the head, cervical spine,  and maxillofacial structures were performed using the standard protocol without intravenous contrast. Multiplanar CT image reconstructions of the cervical spine and maxillofacial structures were also generated. COMPARISON:  Head CT without contrast 01/23/2018. FINDINGS: CT HEAD FINDINGS Brain: There is a small volume of subarachnoid hemorrhage in the right ambient cistern which was normal in 2019 (series 3, images 12 and 13). However, the other basilar cisterns are spared, no other subarachnoid hemorrhage is identified. No intraventricular hemorrhage. However, there is also trace right lateral subdural hematoma measuring 3-4 millimeters in thickness on coronal image 28. Underlying calvarium appears stable and intact. No other intracranial hemorrhage identified. No intracranial mass effect. No ventriculomegaly. Stable gray-white matter differentiation throughout the brain. No cortically based acute infarct identified. Vascular: Calcified atherosclerosis at the skull base. Skull: Calvarium appears stable and intact. Other: No scalp hematoma identified. CT MAXILLOFACIAL FINDINGS Osseous: Mandible intact. Relatively poor dentition. Tripod fracture on the right with comminution of the anterior right maxillary wall, posterior right maxillary wall (series 8, image 55), posterior right zygomatic arch (image 52) and nondisplaced fracture of the right orbital floor. No definite nasal bone fracture. The left maxilla and zygoma are intact. Central skull base appears intact. Orbits: Nondisplaced fracture of the right orbital floor. Minimally displaced fracture of the right lateral orbital wall (series 8, image 63). Small volume extraconal gas within the right orbit. Mild right proptosis. No intraorbital hematoma. The right globe appears intact. The left orbital walls and soft tissues appear normal. Sinuses: Layering hemorrhage in the right maxillary sinus. Blood and/or mucosal thickening scattered in the right ethmoids, mildly  involving the sphenoid sinuses and the left frontal sinus. Mild mucosal thickening in the left maxillary sinus. Tympanic cavities and mastoids are clear. Soft tissues: Negative visible noncontrast larynx,  pharynx, parapharyngeal spaces, retropharyngeal space, sublingual space, submandibular and parotid spaces. Right side superficial face soft tissue hematoma/contusion with mild soft tissue gas overlying the tripod fracture. CT CERVICAL SPINE FINDINGS Alignment: Straightening of cervical lordosis. Cervicothoracic junction alignment is within normal limits. Bilateral posterior element alignment is within normal limits. Skull base and vertebrae: Visualized skull base is intact. No atlanto-occipital dissociation. Osteopenia. No cervical spine fracture identified. Soft tissues and spinal canal: No prevertebral fluid or swelling. No visible canal hematoma. Negative noncontrast neck soft tissues aside from calcified carotid atherosclerosis. Disc levels: Advanced lower cervical disc and endplate degeneration H4-T6 through C7-T1. Mild associated spinal stenosis suspected. Areas of vacuum disc and vacuum endplate geodes. Upper chest: Visible upper thoracic levels appear intact. Mild apical lung scarring. Negative noncontrast thoracic inlet. IMPRESSION: BRAIN: 1. Positive for small volume subarachnoid hemorrhage in the right ambient cistern. Burtis Junes this is posttraumatic (see #2), although CTA Head is suggested to exclude the possibility of intracranial aneurysm. 2. Trace right lateral Subdural Hematoma also. No underlying skull fracture. 3. No intracranial mass effect. FACE: 1. Tripod Fracture on the right. Comminuted right maxillary sinus walls and posterior right zygomatic arch. More simple and mildly displaced fractures of the right orbital floor and right lateral orbital wall. 2. Right side intraorbital gas with mild proptosis. But no intraorbital hematoma. 3. No other facial fracture. Hemorrhage and/or mucosal thickening  in the paranasal sinuses. CERVICAL SPINE: 1.  No acute osseous abnormality identified. 2. Advanced lower cervical spine degeneration with mild spinal stenosis. Electronically Signed: By: Genevie Ann M.D. On: 08/14/2018 19:52   Ct Cervical Spine Wo Contrast  Addendum Date: 08/14/2018   ADDENDUM REPORT: 08/14/2018 20:06 ADDENDUM: Study discussed by telephone with Dr. Dene Gentry on 08/14/2018 at [place] hours. Electronically Signed   By: Genevie Ann M.D.   On: 08/14/2018 20:06   Result Date: 08/14/2018 CLINICAL DATA:  68 year old male with syncopal episode walking to bathroom. Pale, diaphoretic. EXAM: CT HEAD WITHOUT CONTRAST CT MAXILLOFACIAL WITHOUT CONTRAST CT CERVICAL SPINE WITHOUT CONTRAST TECHNIQUE: Multidetector CT imaging of the head, cervical spine, and maxillofacial structures were performed using the standard protocol without intravenous contrast. Multiplanar CT image reconstructions of the cervical spine and maxillofacial structures were also generated. COMPARISON:  Head CT without contrast 01/23/2018. FINDINGS: CT HEAD FINDINGS Brain: There is a small volume of subarachnoid hemorrhage in the right ambient cistern which was normal in 2019 (series 3, images 12 and 13). However, the other basilar cisterns are spared, no other subarachnoid hemorrhage is identified. No intraventricular hemorrhage. However, there is also trace right lateral subdural hematoma measuring 3-4 millimeters in thickness on coronal image 28. Underlying calvarium appears stable and intact. No other intracranial hemorrhage identified. No intracranial mass effect. No ventriculomegaly. Stable gray-white matter differentiation throughout the brain. No cortically based acute infarct identified. Vascular: Calcified atherosclerosis at the skull base. Skull: Calvarium appears stable and intact. Other: No scalp hematoma identified. CT MAXILLOFACIAL FINDINGS Osseous: Mandible intact. Relatively poor dentition. Tripod fracture on the right with  comminution of the anterior right maxillary wall, posterior right maxillary wall (series 8, image 55), posterior right zygomatic arch (image 52) and nondisplaced fracture of the right orbital floor. No definite nasal bone fracture. The left maxilla and zygoma are intact. Central skull base appears intact. Orbits: Nondisplaced fracture of the right orbital floor. Minimally displaced fracture of the right lateral orbital wall (series 8, image 63). Small volume extraconal gas within the right orbit. Mild right proptosis. No intraorbital hematoma. The right globe  appears intact. The left orbital walls and soft tissues appear normal. Sinuses: Layering hemorrhage in the right maxillary sinus. Blood and/or mucosal thickening scattered in the right ethmoids, mildly involving the sphenoid sinuses and the left frontal sinus. Mild mucosal thickening in the left maxillary sinus. Tympanic cavities and mastoids are clear. Soft tissues: Negative visible noncontrast larynx, pharynx, parapharyngeal spaces, retropharyngeal space, sublingual space, submandibular and parotid spaces. Right side superficial face soft tissue hematoma/contusion with mild soft tissue gas overlying the tripod fracture. CT CERVICAL SPINE FINDINGS Alignment: Straightening of cervical lordosis. Cervicothoracic junction alignment is within normal limits. Bilateral posterior element alignment is within normal limits. Skull base and vertebrae: Visualized skull base is intact. No atlanto-occipital dissociation. Osteopenia. No cervical spine fracture identified. Soft tissues and spinal canal: No prevertebral fluid or swelling. No visible canal hematoma. Negative noncontrast neck soft tissues aside from calcified carotid atherosclerosis. Disc levels: Advanced lower cervical disc and endplate degeneration Y6-R4 through C7-T1. Mild associated spinal stenosis suspected. Areas of vacuum disc and vacuum endplate geodes. Upper chest: Visible upper thoracic levels appear  intact. Mild apical lung scarring. Negative noncontrast thoracic inlet. IMPRESSION: BRAIN: 1. Positive for small volume subarachnoid hemorrhage in the right ambient cistern. Burtis Junes this is posttraumatic (see #2), although CTA Head is suggested to exclude the possibility of intracranial aneurysm. 2. Trace right lateral Subdural Hematoma also. No underlying skull fracture. 3. No intracranial mass effect. FACE: 1. Tripod Fracture on the right. Comminuted right maxillary sinus walls and posterior right zygomatic arch. More simple and mildly displaced fractures of the right orbital floor and right lateral orbital wall. 2. Right side intraorbital gas with mild proptosis. But no intraorbital hematoma. 3. No other facial fracture. Hemorrhage and/or mucosal thickening in the paranasal sinuses. CERVICAL SPINE: 1.  No acute osseous abnormality identified. 2. Advanced lower cervical spine degeneration with mild spinal stenosis. Electronically Signed: By: Genevie Ann M.D. On: 08/14/2018 19:52   Dg Chest Port 1 View  Result Date: 08/14/2018 CLINICAL DATA:  [place] at home 1 hour ago, congestion and productive cough for 1-2 weeks, RIGHT lateral shoulder pain, hypertension, smoker, history of pancreatitis, cirrhosis, diabetes mellitus EXAM: PORTABLE CHEST 1 VIEW COMPARISON:  Portable exam 1659 hours compared to 09/02/2015 FINDINGS: Normal heart size, mediastinal contours, and pulmonary vascularity. Probable emphysematous changes without infiltrate, pleural effusion, or pneumothorax. Bones demineralized. IMPRESSION: Probable emphysematous changes without infiltrate. Electronically Signed   By: Lavonia Dana M.D.   On: 08/14/2018 17:34   Dg Knee Complete 4 Views Left  Result Date: 08/16/2018 CLINICAL DATA:  Fall 2 days ago with left knee pain. EXAM: LEFT KNEE - COMPLETE 4+ VIEW COMPARISON:  None. FINDINGS: No evidence of fracture, dislocation, or joint effusion. No evidence of arthropathy or other focal bone abnormality. Atherosclerotic  disease soft tissues are unremarkable. IMPRESSION: No acute findings. Electronically Signed   By: Marin Olp M.D.   On: 08/16/2018 11:25   Ct Maxillofacial Wo Cm  Addendum Date: 08/14/2018   ADDENDUM REPORT: 08/14/2018 20:06 ADDENDUM: Study discussed by telephone with Dr. Dene Gentry on 08/14/2018 at [place] hours. Electronically Signed   By: Genevie Ann M.D.   On: 08/14/2018 20:06   Result Date: 08/14/2018 CLINICAL DATA:  68 year old male with syncopal episode walking to bathroom. Pale, diaphoretic. EXAM: CT HEAD WITHOUT CONTRAST CT MAXILLOFACIAL WITHOUT CONTRAST CT CERVICAL SPINE WITHOUT CONTRAST TECHNIQUE: Multidetector CT imaging of the head, cervical spine, and maxillofacial structures were performed using the standard protocol without intravenous contrast. Multiplanar CT image reconstructions of the  cervical spine and maxillofacial structures were also generated. COMPARISON:  Head CT without contrast 01/23/2018. FINDINGS: CT HEAD FINDINGS Brain: There is a small volume of subarachnoid hemorrhage in the right ambient cistern which was normal in 2019 (series 3, images 12 and 13). However, the other basilar cisterns are spared, no other subarachnoid hemorrhage is identified. No intraventricular hemorrhage. However, there is also trace right lateral subdural hematoma measuring 3-4 millimeters in thickness on coronal image 28. Underlying calvarium appears stable and intact. No other intracranial hemorrhage identified. No intracranial mass effect. No ventriculomegaly. Stable gray-white matter differentiation throughout the brain. No cortically based acute infarct identified. Vascular: Calcified atherosclerosis at the skull base. Skull: Calvarium appears stable and intact. Other: No scalp hematoma identified. CT MAXILLOFACIAL FINDINGS Osseous: Mandible intact. Relatively poor dentition. Tripod fracture on the right with comminution of the anterior right maxillary wall, posterior right maxillary wall (series 8, image  55), posterior right zygomatic arch (image 52) and nondisplaced fracture of the right orbital floor. No definite nasal bone fracture. The left maxilla and zygoma are intact. Central skull base appears intact. Orbits: Nondisplaced fracture of the right orbital floor. Minimally displaced fracture of the right lateral orbital wall (series 8, image 63). Small volume extraconal gas within the right orbit. Mild right proptosis. No intraorbital hematoma. The right globe appears intact. The left orbital walls and soft tissues appear normal. Sinuses: Layering hemorrhage in the right maxillary sinus. Blood and/or mucosal thickening scattered in the right ethmoids, mildly involving the sphenoid sinuses and the left frontal sinus. Mild mucosal thickening in the left maxillary sinus. Tympanic cavities and mastoids are clear. Soft tissues: Negative visible noncontrast larynx, pharynx, parapharyngeal spaces, retropharyngeal space, sublingual space, submandibular and parotid spaces. Right side superficial face soft tissue hematoma/contusion with mild soft tissue gas overlying the tripod fracture. CT CERVICAL SPINE FINDINGS Alignment: Straightening of cervical lordosis. Cervicothoracic junction alignment is within normal limits. Bilateral posterior element alignment is within normal limits. Skull base and vertebrae: Visualized skull base is intact. No atlanto-occipital dissociation. Osteopenia. No cervical spine fracture identified. Soft tissues and spinal canal: No prevertebral fluid or swelling. No visible canal hematoma. Negative noncontrast neck soft tissues aside from calcified carotid atherosclerosis. Disc levels: Advanced lower cervical disc and endplate degeneration D2-K0 through C7-T1. Mild associated spinal stenosis suspected. Areas of vacuum disc and vacuum endplate geodes. Upper chest: Visible upper thoracic levels appear intact. Mild apical lung scarring. Negative noncontrast thoracic inlet. IMPRESSION: BRAIN: 1.  Positive for small volume subarachnoid hemorrhage in the right ambient cistern. Burtis Junes this is posttraumatic (see #2), although CTA Head is suggested to exclude the possibility of intracranial aneurysm. 2. Trace right lateral Subdural Hematoma also. No underlying skull fracture. 3. No intracranial mass effect. FACE: 1. Tripod Fracture on the right. Comminuted right maxillary sinus walls and posterior right zygomatic arch. More simple and mildly displaced fractures of the right orbital floor and right lateral orbital wall. 2. Right side intraorbital gas with mild proptosis. But no intraorbital hematoma. 3. No other facial fracture. Hemorrhage and/or mucosal thickening in the paranasal sinuses. CERVICAL SPINE: 1.  No acute osseous abnormality identified. 2. Advanced lower cervical spine degeneration with mild spinal stenosis. Electronically Signed: By: Genevie Ann M.D. On: 08/14/2018 19:52    Procedures None  Hospital Course:  Alexander Schmitt is a 68yo male who presented to [facility] 2/8 after suffering a ground level fall. Patient was drinking earlier and was walking out of the bathroom when he passed out and fell.  Workup  showed SDH and right tripod fractures.  Patient was admitted to the trauma service.  ENT consulted for facial fractures and recommended nonoperatively management with outpatient follow up. Neurosurgery was consulted for TBI/SDH and checked a CTA which ruled out aneurysm as preceding incident. He was started on Keppra x7 days for seizure prophylaxis. Follow up head CT scan on 2/9 was stable.   Patient worked with therapies during this admission. On 2/10 the patient was voiding well, tolerating diet, ambulating well, pain well controlled, vital signs stable and felt stable for discharge home.  Patient will follow up as below and knows to call with questions or concerns.      Allergies as of 08/16/2018   No Known Allergies     Medication List    TAKE these medications   acetaminophen 325 MG  tablet Commonly known as:  TYLENOL Take 650 mg by mouth every 6 (six) hours as needed (pain). Reported on 10/22/2015   [employer] D3 25 MCG (1000 UT) capsule Generic drug:  Cholecalciferol Take 1,000 Units by mouth daily.   folic acid 1 MG tablet Commonly known as:  FOLVITE Take 1 mg by mouth daily.   hydrochlorothiazide 25 MG tablet Commonly known as:  HYDRODIURIL Take 25 mg by mouth daily.   levETIRAcetam 500 MG tablet Commonly known as:  KEPPRA Take 1 tablet (500 mg total) by mouth 2 (two) times daily.   multivitamin with minerals Tabs tablet Take 1 tablet by mouth daily.   omeprazole 20 MG capsule Commonly known as:  PRILOSEC TAKE 1 CAPSULE (20 MG TOTAL) BY MOUTH DAILY.        Follow-up Information    Consuella Lose, MD. Call.   Specialty:  Neurosurgery Why:  as needed regarding head injury, you do not have to schedule an appointment Contact information: [address] [place] [address] [place], [facility]. Call.   Specialty:  Pediatrics Why:  call to arrange post-hospitalization follow up appointment Contact information: [place] [address] [phone]        Leta Baptist, MD. Schedule an appointment as soon as possible for a visit in 1 week(s).   Specialty:  Otolaryngology Why:  call to arrange follow up regading facial fracture Contact information: [address] [place] [address] [phone]           Signed: Wellington Hampshire, [facility] Surgery 08/16/2018, 2:46 PM Pager: [phone] Mon-Thurs 7:00 am-4:30 pm Fri 7:00 am -11:30 AM Sat-Sun 7:00 am-11:30 am

## 2018-08-16 NOTE — Progress Notes (Signed)
IV removed, discharge instructions reviewed with patient.  Patient to be discharged home with girlfriend.

## 2018-08-16 NOTE — Progress Notes (Signed)
Occupational Therapy Treatment Patient Details Name: MOTTY BORIN MRN: [id number] DOB: [birth year] Today's Date: 08/16/2018    History of present illness This 68 y.o. male admitted after passing out after drinking striking his head, Rt cheek and shoulder.  CT of head showed  insignificant right tentorial SDH with orbit fx.  PMH includes ETOH abuse, cirrhosis, chronic calcific pancreatitis, UGI bleed    OT comments  Patient pleasant and cooperative.  Patient demonstrates functional mobility, transfers and ADLs at supervision level for safety with good awareness of obstacle negotiation today.  Completed MOCA basic cognitive assessment with patient, scoring 23/30 with errors in short term memory, naming/fluency, and visual perception--but I anticipate this is near his baseline function.  Recommend 24/7 support initially, which patient has from his girlfriend. Will continue to follow while admitted, but dc plan changed to no OT follow up.     Follow Up Recommendations  No OT follow up;Supervision/Assistance - 24 hour    Equipment Recommendations  None recommended by OT    Recommendations for Other Services      Precautions / Restrictions Precautions Precautions: Fall Restrictions Weight Bearing Restrictions: No       Mobility Bed Mobility Overal bed mobility: Independent                Transfers Overall transfer level: Needs assistance Equipment used: None Transfers: Sit to/from Stand Sit to Stand: Supervision         General transfer comment: supervision for safety    Balance Overall balance assessment: Mild deficits observed, not formally tested                                         ADL either performed or assessed with clinical judgement   ADL Overall ADL's : Needs assistance/impaired                     Lower Body Dressing: Supervision/safety;Sit to/from stand   Toilet Transfer: Supervision/safety;Ambulation;Comfort height  toilet           Functional mobility during ADLs: Supervision/safety General ADL Comments: supervision for safety during functional mobility     Vision   Additional Comments: pt with good awareness of obstacles negotitation today,   Perception     Praxis      Cognition Arousal/Alertness: Awake/alert Behavior During Therapy: WFL for tasks assessed/performed Overall Cognitive Status: Within Functional Limits for tasks assessed                                 General Comments: MOCA basic completed with patient- scoring 23/30 points; mild deficits in short term memory, and slight deficits in visuoperception and naming-- but anticipate baseline function.         Exercises     Shoulder Instructions       General Comments      Pertinent Vitals/ Pain       Pain Assessment: No/denies pain  Home Living                                          Prior Functioning/Environment              Frequency  Min 2X/week  Progress Toward Goals  OT Goals(current goals can now be found in the care plan section)  Progress towards OT goals: Progressing toward goals  Acute Rehab OT Goals Patient Stated Goal: to go home  OT Goal Formulation: With patient Time For Goal Achievement: 08/29/18 Potential to Achieve Goals: Good  Plan Discharge plan needs to be updated;Frequency remains appropriate    Co-evaluation                 AM-PAC OT "6 Clicks" Daily Activity     Outcome Measure   Help from another person eating meals?: None Help from another person taking care of personal grooming?: None Help from another person toileting, which includes using toliet, bedpan, or urinal?: None Help from another person bathing (including washing, rinsing, drying)?: None Help from another person to put on and taking off regular upper body clothing?: None Help from another person to put on and taking off regular lower body clothing?: None 6  Click Score: 24    End of Session    OT Visit Diagnosis: Unsteadiness on feet (R26.81);Cognitive communication deficit (R41.841)   Activity Tolerance Patient tolerated treatment well   Patient Left in bed;with call bell/phone within reach;with bed alarm set   Nurse Communication Mobility status        Time: 5697-9480 OT Time Calculation (min): 25 min  Charges: OT General Charges $OT Visit: 1 Visit OT Treatments $Self Care/Home Management : 8-22 mins $Cognitive Funtion inital: Initial 15 mins  Delight Stare, OT Acute Rehabilitation Services Pager [phone] Office [phone]    Delight Stare 08/16/2018, 3:19 PM

## 2018-08-16 NOTE — Evaluation (Signed)
Physical Therapy Evaluation/Discharge Patient Details Name: Alexander Schmitt MRN: [id number] DOB: Dec 07, 1950 Today's Date: 08/16/2018   History of Present Illness  This 68 y.o. male admitted after passing out after drinking striking his head, Rt cheek and shoulder.  CT of head showed  insignificant right tentorial SDH with orbit fx.  PMH includes ETOH abuse, cirrhosis, chronic calcific pancreatitis, UGI bleed   Clinical Impression  Pt was pleasant to work with and had no complaints of pain. Pt performed all bed mobility and ambulation tasks with supervision assistance with no LOB. Pt does not require any additional services and is safe for discharge home. Pt informed of plan and agreed.    Follow Up Recommendations No PT follow up    Equipment Recommendations  None recommended by PT    Recommendations for Other Services       Precautions / Restrictions Precautions Precautions: Fall Restrictions Weight Bearing Restrictions: No      Mobility  Bed Mobility Overal bed mobility: Independent                Transfers Overall transfer level: Needs assistance Equipment used: None Transfers: Sit to/from Stand;Stand Pivot Transfers Sit to Stand: Supervision Stand pivot transfers: Supervision          Ambulation/Gait   Gait Distance (Feet): 500 Feet   Gait Pattern/deviations: WFL(Within Functional Limits)     General Gait Details: Pt ambulated with head turns(horizontal and vertical), change in speeds and stepping over object with no LOB or dizziness  Stairs Stairs: Yes Stairs assistance: Supervision Stair Management: One rail Left   General stair comments: pt navigated up/down full flight of stairs  Wheelchair Mobility    Modified Rankin (Stroke Patients Only)       Balance Overall balance assessment: Mild deficits observed, not formally tested                                           Pertinent Vitals/Pain Pain Assessment: No/denies  pain    Home Living Family/patient expects to be discharged to:: Private residence Living Arrangements: Spouse/significant other Available Help at Discharge: Family;Available 24 hours/day Type of Home: Apartment Home Access: Stairs to enter Entrance Stairs-Rails: Right Entrance Stairs-Number of Steps: 3 Home Layout: One level Home Equipment: None Additional Comments: lives with girlfriend     Prior Function Level of Independence: Independent         Comments: Pt reports he is fully independent with ADLs and IADLs.  He does not work.  He does not drive, but relies on public transportation      Hand Dominance        Extremity/Trunk Assessment   Upper Extremity Assessment Upper Extremity Assessment: Overall WFL for tasks assessed    Lower Extremity Assessment Lower Extremity Assessment: Overall WFL for tasks assessed    Cervical / Trunk Assessment Cervical / Trunk Assessment: Normal  Communication   Communication: No difficulties  Cognition Arousal/Alertness: Awake/alert Behavior During Therapy: WFL for tasks assessed/performed Overall Cognitive Status: Within Functional Limits for tasks assessed                                        General Comments      Exercises     Assessment/Plan    PT Assessment Patent does not need any  further PT services  PT Problem List         PT Treatment Interventions      PT Goals (Current goals can be found in the Care Plan section)  Acute Rehab PT Goals Patient Stated Goal: to go home  PT Goal Formulation: With patient Time For Goal Achievement: 08/23/18 Potential to Achieve Goals: Good    Frequency     Barriers to discharge        Co-evaluation               AM-PAC PT "6 Clicks" Mobility  Outcome Measure Help needed turning from your back to your side while in a flat bed without using bedrails?: None Help needed moving from lying on your back to sitting on the side of a flat bed  without using bedrails?: None Help needed moving to and from a bed to a chair (including a wheelchair)?: None Help needed standing up from a chair using your arms (e.g., wheelchair or bedside chair)?: None Help needed to walk in hospital room?: None Help needed climbing 3-5 steps with a railing? : None 6 Click Score: 24    End of Session Equipment Utilized During Treatment: Gait belt Activity Tolerance: Patient tolerated treatment well Patient left: in chair;with call bell/phone within reach   PT Visit Diagnosis: Other abnormalities of gait and mobility (R26.89);History of falling (Z91.81)    Time: 8502-7741 PT Time Calculation (min) (ACUTE ONLY): 12 min   Charges:   PT Evaluation $PT Eval Low Complexity: 1 Low          Dia Donate, SPT [phone]   Shirely Toren 08/16/2018, 9:32 AM

## 2018-08-16 NOTE — Progress Notes (Signed)
CSW completed Sbirt with patient. Patient did have a high blood alcohol level once arriving at the hospital. He reported that around the holidays he picked up his drinking. He stated that he is trying to stop. Prior to the holidays he reported that he was 6 months sober. CSW provided substance abuse resources.   CSW signing off.   Domenic Schwab, MSW, [place]

## 2019-02-01 ENCOUNTER — Other Ambulatory Visit: Payer: Self-pay | Admitting: Orthopedic Surgery

## 2019-02-22 ENCOUNTER — Other Ambulatory Visit: Payer: Self-pay

## 2019-02-22 ENCOUNTER — Encounter (HOSPITAL_BASED_OUTPATIENT_CLINIC_OR_DEPARTMENT_OTHER): Payer: Self-pay | Admitting: *Deleted

## 2019-02-25 ENCOUNTER — Other Ambulatory Visit: Payer: Self-pay

## 2019-02-25 ENCOUNTER — Other Ambulatory Visit (HOSPITAL_COMMUNITY)
Admission: RE | Admit: 2019-02-25 | Discharge: 2019-02-25 | Disposition: A | Discharge status: Home / Self Care | Payer: Medicare Other | Source: Ambulatory Visit | Attending: Orthopedic Surgery | Admitting: Orthopedic Surgery

## 2019-02-25 ENCOUNTER — Encounter (HOSPITAL_BASED_OUTPATIENT_CLINIC_OR_DEPARTMENT_OTHER)
Admission: RE | Admit: 2019-02-25 | Discharge: 2019-02-25 | Disposition: A | Discharge status: Home / Self Care | Payer: Medicare Other | Source: Ambulatory Visit | Attending: Orthopedic Surgery | Admitting: Orthopedic Surgery

## 2019-02-25 DIAGNOSIS — Z20828 Contact with and (suspected) exposure to other viral communicable diseases: Secondary | ICD-10-CM | POA: Diagnosis not present

## 2019-02-25 DIAGNOSIS — M720 Palmar fascial fibromatosis [Dupuytren]: Secondary | ICD-10-CM | POA: Diagnosis not present

## 2019-02-25 DIAGNOSIS — Z01812 Encounter for preprocedural laboratory examination: Secondary | ICD-10-CM | POA: Insufficient documentation

## 2019-02-25 LAB — COMPREHENSIVE METABOLIC PANEL
ALT: 28 U/L (ref 0–44)
AST: 29 U/L (ref 15–41)
Albumin: 3.3 g/dL — ABNORMAL LOW (ref 3.5–5.0)
Alkaline Phosphatase: 77 U/L (ref 38–126)
Anion gap: 6 (ref 5–15)
BUN: 6 mg/dL — ABNORMAL LOW (ref 8–23)
CO2: 27 mmol/L (ref 22–32)
Calcium: 8.8 mg/dL — ABNORMAL LOW (ref 8.9–10.3)
Chloride: 107 mmol/L (ref 98–111)
Creatinine, Ser: 0.99 mg/dL (ref 0.61–1.24)
GFR calc Af Amer: >60 mL/min (ref >60)
GFR calc non Af Amer: >60 mL/min (ref >60)
Glucose, Bld: 97 mg/dL (ref 70–99)
Potassium: 4.2 mmol/L (ref 3.5–5.1)
Sodium: 140 mmol/L (ref 135–145)
Total Bilirubin: 0.9 mg/dL (ref 0.3–1.2)
Total Protein: 5.9 g/dL — ABNORMAL LOW (ref 6.5–8.1)

## 2019-02-25 LAB — SARS CORONAVIRUS 2 (TAT 6-24 HRS): SARS Coronavirus 2: NEGATIVE

## 2019-03-01 ENCOUNTER — Ambulatory Visit (HOSPITAL_BASED_OUTPATIENT_CLINIC_OR_DEPARTMENT_OTHER)
Admission: RE | Admit: 2019-03-01 | Discharge: 2019-03-01 | Disposition: A | Discharge status: Home / Self Care | Payer: Medicare Other | Attending: Orthopedic Surgery | Admitting: Orthopedic Surgery

## 2019-03-01 ENCOUNTER — Encounter (HOSPITAL_BASED_OUTPATIENT_CLINIC_OR_DEPARTMENT_OTHER)
Admission: RE | Disposition: A | Discharge status: Home / Self Care | Payer: Self-pay | Source: Home / Self Care | Attending: Orthopedic Surgery

## 2019-03-01 ENCOUNTER — Encounter (HOSPITAL_BASED_OUTPATIENT_CLINIC_OR_DEPARTMENT_OTHER): Payer: Self-pay | Admitting: *Deleted

## 2019-03-01 ENCOUNTER — Ambulatory Visit (HOSPITAL_BASED_OUTPATIENT_CLINIC_OR_DEPARTMENT_OTHER): Payer: Medicare Other | Admitting: Anesthesiology

## 2019-03-01 ENCOUNTER — Other Ambulatory Visit: Payer: Self-pay

## 2019-03-01 DIAGNOSIS — I10 Essential (primary) hypertension: Secondary | ICD-10-CM | POA: Diagnosis not present

## 2019-03-01 DIAGNOSIS — K7031 Alcoholic cirrhosis of liver with ascites: Secondary | ICD-10-CM | POA: Diagnosis not present

## 2019-03-01 DIAGNOSIS — F17210 Nicotine dependence, cigarettes, uncomplicated: Secondary | ICD-10-CM | POA: Diagnosis not present

## 2019-03-01 DIAGNOSIS — M19012 Primary osteoarthritis, left shoulder: Secondary | ICD-10-CM | POA: Insufficient documentation

## 2019-03-01 DIAGNOSIS — Z86718 Personal history of other venous thrombosis and embolism: Secondary | ICD-10-CM | POA: Insufficient documentation

## 2019-03-01 DIAGNOSIS — K219 Gastro-esophageal reflux disease without esophagitis: Secondary | ICD-10-CM | POA: Insufficient documentation

## 2019-03-01 DIAGNOSIS — M1990 Unspecified osteoarthritis, unspecified site: Secondary | ICD-10-CM | POA: Diagnosis not present

## 2019-03-01 DIAGNOSIS — Z86010 Personal history of colonic polyps: Secondary | ICD-10-CM | POA: Insufficient documentation

## 2019-03-01 DIAGNOSIS — M720 Palmar fascial fibromatosis [Dupuytren]: Secondary | ICD-10-CM | POA: Insufficient documentation

## 2019-03-01 DIAGNOSIS — Z8249 Family history of ischemic heart disease and other diseases of the circulatory system: Secondary | ICD-10-CM | POA: Diagnosis not present

## 2019-03-01 DIAGNOSIS — Z8711 Personal history of peptic ulcer disease: Secondary | ICD-10-CM | POA: Insufficient documentation

## 2019-03-01 HISTORY — PX: FASCIECTOMY: SHX6525

## 2019-03-01 HISTORY — DX: Essential (primary) hypertension: I10

## 2019-03-01 SURGERY — FASCIECTOMY, PALM
Anesthesia: Monitor Anesthesia Care | Site: Hand | Laterality: Right

## 2019-03-01 MED ORDER — FENTANYL CITRATE (PF) 100 MCG/2ML IJ SOLN
INTRAMUSCULAR | Status: AC
  Filled 2019-03-01: qty 2

## 2019-03-01 MED ORDER — THROMBIN 5000 UNITS EX SOLR
CUTANEOUS | Status: AC
  Filled 2019-03-01: qty 5000

## 2019-03-01 MED ORDER — TRAMADOL HCL 50 MG PO TABS: 50.0000 mg | ORAL_TABLET | Freq: Four times a day (QID) | ORAL | 0 refills | Status: DC | PRN

## 2019-03-01 MED ORDER — MIDAZOLAM HCL 2 MG/2ML IJ SOLN
INTRAMUSCULAR | Status: AC
  Filled 2019-03-01: qty 2

## 2019-03-01 MED ORDER — ONDANSETRON HCL 4 MG/2ML IJ SOLN
INTRAMUSCULAR | Status: AC
  Filled 2019-03-01: qty 2

## 2019-03-01 MED ORDER — CEFAZOLIN SODIUM-DEXTROSE 2-4 GM/100ML-% IV SOLN
INTRAVENOUS | Status: AC
  Filled 2019-03-01: qty 100

## 2019-03-01 MED ORDER — OXYCODONE HCL 5 MG PO TABS: 5.0000 mg | ORAL_TABLET | Freq: Once | ORAL | Status: DC | PRN

## 2019-03-01 MED ORDER — BUPIVACAINE HCL (PF) 0.25 % IJ SOLN
INTRAMUSCULAR | Status: AC
  Filled 2019-03-01: qty 60

## 2019-03-01 MED ORDER — PROPOFOL 500 MG/50ML IV EMUL
INTRAVENOUS | Status: AC
  Filled 2019-03-01: qty 50

## 2019-03-01 MED ORDER — BUPIVACAINE-EPINEPHRINE (PF) 0.5% -1:200000 IJ SOLN
INTRAMUSCULAR | Status: DC | PRN
  Administered 2019-03-01: 30 mL via PERINEURAL

## 2019-03-01 MED ORDER — FENTANYL CITRATE (PF) 100 MCG/2ML IJ SOLN: 25.0000 ug | INTRAMUSCULAR | Status: DC | PRN

## 2019-03-01 MED ORDER — CEFAZOLIN SODIUM-DEXTROSE 2-4 GM/100ML-% IV SOLN
2.0000 g | INTRAVENOUS | Status: AC
  Administered 2019-03-01: 09:00:00 2 g via INTRAVENOUS

## 2019-03-01 MED ORDER — OXYCODONE HCL 5 MG/5ML PO SOLN: 5.0000 mg | Freq: Once | ORAL | Status: DC | PRN

## 2019-03-01 MED ORDER — MIDAZOLAM HCL 2 MG/2ML IJ SOLN
1.0000 mg | INTRAMUSCULAR | Status: DC | PRN
  Administered 2019-03-01: 2 mg via INTRAVENOUS

## 2019-03-01 MED ORDER — ONDANSETRON HCL 4 MG/2ML IJ SOLN
INTRAMUSCULAR | Status: DC | PRN
  Administered 2019-03-01: 4 mg via INTRAVENOUS

## 2019-03-01 MED ORDER — LACTATED RINGERS IV SOLN
INTRAVENOUS | Status: DC
  Administered 2019-03-01: 07:00:00 via INTRAVENOUS

## 2019-03-01 MED ORDER — ONDANSETRON HCL 4 MG/2ML IJ SOLN: 4.0000 mg | Freq: Once | INTRAMUSCULAR | Status: DC | PRN

## 2019-03-01 MED ORDER — CHLORHEXIDINE GLUCONATE 4 % EX LIQD: 60.0000 mL | Freq: Once | CUTANEOUS | Status: DC

## 2019-03-01 MED ORDER — FENTANYL CITRATE (PF) 100 MCG/2ML IJ SOLN
50.0000 ug | INTRAMUSCULAR | Status: DC | PRN
  Administered 2019-03-01: 50 ug via INTRAVENOUS

## 2019-03-01 MED ORDER — PROPOFOL 500 MG/50ML IV EMUL
INTRAVENOUS | Status: DC | PRN
  Administered 2019-03-01: 75 ug/kg/min via INTRAVENOUS

## 2019-03-01 SURGICAL SUPPLY — 49 items
APL PRP STRL LF DISP 70% ISPRP (MISCELLANEOUS) ×1
BLADE MINI RND TIP GREEN BEAV (BLADE) ×3 IMPLANT
BLADE SURG 15 STRL LF DISP TIS (BLADE) ×1 IMPLANT
BLADE SURG 15 STRL SS (BLADE) ×3
BNDG CMPR 9X4 STRL LF SNTH (GAUZE/BANDAGES/DRESSINGS) ×1
BNDG COHESIVE 3X5 TAN STRL LF (GAUZE/BANDAGES/DRESSINGS) ×3 IMPLANT
BNDG ESMARK 4X9 LF (GAUZE/BANDAGES/DRESSINGS) ×3 IMPLANT
BNDG GAUZE ELAST 4 BULKY (GAUZE/BANDAGES/DRESSINGS) ×3 IMPLANT
CHLORAPREP W/TINT 26 (MISCELLANEOUS) ×3 IMPLANT
CORD BIPOLAR FORCEPS 12FT (ELECTRODE) ×3 IMPLANT
COVER BACK TABLE REUSABLE LG (DRAPES) ×3 IMPLANT
COVER MAYO STAND REUSABLE (DRAPES) ×3 IMPLANT
COVER WAND RF STERILE (DRAPES) IMPLANT
CUFF TOURN SGL QUICK 18X4 (TOURNIQUET CUFF) ×2 IMPLANT
DECANTER SPIKE VIAL GLASS SM (MISCELLANEOUS) IMPLANT
DRAPE EXTREMITY T 121X128X90 (DISPOSABLE) ×3 IMPLANT
DRAPE SURG 17X23 STRL (DRAPES) ×3 IMPLANT
DRSG PAD ABDOMINAL 8X10 ST (GAUZE/BANDAGES/DRESSINGS) ×2 IMPLANT
GAUZE SPONGE 4X4 12PLY STRL (GAUZE/BANDAGES/DRESSINGS) ×5 IMPLANT
GAUZE XEROFORM 1X8 LF (GAUZE/BANDAGES/DRESSINGS) ×3 IMPLANT
GLOVE BIO SURGEON STRL SZ7 (GLOVE) ×2 IMPLANT
GLOVE BIOGEL M 7.0 STRL (GLOVE) ×2 IMPLANT
GLOVE BIOGEL PI IND STRL 7.0 (GLOVE) IMPLANT
GLOVE BIOGEL PI IND STRL 8.5 (GLOVE) ×1 IMPLANT
GLOVE BIOGEL PI INDICATOR 7.0 (GLOVE) ×6
GLOVE BIOGEL PI INDICATOR 8.5 (GLOVE) ×2
GLOVE ECLIPSE 6.5 STRL STRAW (GLOVE) ×2 IMPLANT
GLOVE SURG ORTHO 8.0 STRL STRW (GLOVE) ×3 IMPLANT
GOWN STRL REUS W/ TWL LRG LVL3 (GOWN DISPOSABLE) ×1 IMPLANT
GOWN STRL REUS W/TWL LRG LVL3 (GOWN DISPOSABLE) ×3
GOWN STRL REUS W/TWL XL LVL3 (GOWN DISPOSABLE) ×5 IMPLANT
LOOP VESSEL MAXI BLUE (MISCELLANEOUS) ×3 IMPLANT
NDL PRECISIONGLIDE 27X1.5 (NEEDLE) ×1 IMPLANT
NEEDLE PRECISIONGLIDE 27X1.5 (NEEDLE) ×3 IMPLANT
NS IRRIG 1000ML POUR BTL (IV SOLUTION) ×3 IMPLANT
PACK BASIN DAY SURGERY FS (CUSTOM PROCEDURE TRAY) ×3 IMPLANT
PAD CAST 3X4 CTTN HI CHSV (CAST SUPPLIES) ×1 IMPLANT
PADDING CAST COTTON 3X4 STRL (CAST SUPPLIES) ×3
SLEEVE SCD COMPRESS KNEE MED (MISCELLANEOUS) ×3 IMPLANT
SLING ARM FOAM STRAP LRG (SOFTGOODS) ×2 IMPLANT
SPLINT PLASTER CAST XFAST 3X15 (CAST SUPPLIES) IMPLANT
SPLINT PLASTER XTRA FASTSET 3X (CAST SUPPLIES) ×16
STOCKINETTE 4X48 STRL (DRAPES) ×3 IMPLANT
SUT ETHILON 4 0 PS 2 18 (SUTURE) ×3 IMPLANT
SUT SILK 2 0 PERMA HAND 18 BK (SUTURE) IMPLANT
SYR BULB 3OZ (MISCELLANEOUS) ×3 IMPLANT
SYR CONTROL 10ML LL (SYRINGE) ×3 IMPLANT
TOWEL GREEN STERILE FF (TOWEL DISPOSABLE) ×6 IMPLANT
UNDERPAD 30X30 (UNDERPADS AND DIAPERS) ×1 IMPLANT

## 2019-03-01 NOTE — Anesthesia Preprocedure Evaluation (Addendum)
Anesthesia Evaluation  Patient identified by MRN, date of birth, ID band Patient awake    Reviewed: Allergy & Precautions, NPO status , Patient's Chart, lab work & pertinent test results  History of Anesthesia Complications Negative for: history of anesthetic complications  Airway Mallampati: II  TM Distance: >3 FB Neck ROM: Full    Dental  (+) Dental Advisory Given, Poor Dentition, Chipped   Pulmonary Current Smoker and Patient abstained from smoking.,    Pulmonary exam normal        Cardiovascular Exercise Tolerance: Good hypertension, Pt. on medications Normal cardiovascular exam     Neuro/Psych  Hx SDH  negative psych ROS   GI/Hepatic PUD, GERD  Controlled,(+) Cirrhosis   ascites  substance abuse  alcohol use,   Endo/Other  negative endocrine ROS  Renal/GU negative Renal ROS     Musculoskeletal  (+) Arthritis ,   Abdominal   Peds  Hematology negative hematology ROS (+)   Anesthesia Other Findings   Reproductive/Obstetrics                            Anesthesia Physical Anesthesia Plan  ASA: III  Anesthesia Plan: Regional and MAC   Post-op Pain Management:    Induction: Intravenous  PONV Risk Score and Plan: 1 and Propofol infusion and Treatment may vary due to age or medical condition  Airway Management Planned: Natural Airway and Simple Face Mask  Additional Equipment: None  Intra-op Plan:   Post-operative Plan:   Informed Consent: I have reviewed the patients History and Physical, chart, labs and discussed the procedure including the risks, benefits and alternatives for the proposed anesthesia with the patient or authorized representative who has indicated his/her understanding and acceptance.       Plan Discussed with: CRNA and Anesthesiologist  Anesthesia Plan Comments:        Anesthesia Quick Evaluation

## 2019-03-01 NOTE — H&P (Signed)
Alexander Schmitt is an 68 y.o. male.   Chief Complaint: contracture right ring fingerHPI: Alexander Schmitt is a 68 year old left-hand-dominant male who is referred by Dr. Vassie Moment for consultation regarding a contracture of his right ring finger. He states is been present for 2 to 3 years. He recalls no history of injury. He has no parent or sibling with similar problems. He is not complain of any pain. Does not have these on his feet. He has no curvature of his penis. He does have a history of alcohol assumption. He states this has been progressive. He has a history of arthritis no history of diabetes thyroid problems or gout. Family history is negative for each of these.   Past Medical History:  Diagnosis Date  . Alcoholism ([place]) since before 2013  . Anemia 08/2015   PRBC x 2 units 08/2015.   . Arthritis    left shoulder   . Ascites 11/2005, 05/2012   05/2012:4 liter paracentesis, no SBP   . Cirrhosis ([place]) 2013  . Colon polyp 09/07/15   ascenging colon, minor/scattered diverticulosis on colonoscopy  . GERD (gastroesophageal reflux disease)    occasional   . History of stomach ulcers   . Hypertension   . Pancreatitis [id number]   acute alcoholic and chronic; pseudocyst and pancreatic calcifications 11/2011  . SBO (small bowel obstruction) ([place]) 11/2005   medical mgt  . 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 COLONOSCOPY N/A 09/07/2015   Procedure: COLONOSCOPY;  Surgeon: Irene Shipper, MD;  Location: [facility] ENDOSCOPY;  Service: Endoscopy;  Laterality: N/A;  . COLONOSCOPY    . ESOPHAGOGASTRODUODENOSCOPY Left 09/02/2015   Procedure: ESOPHAGOGASTRODUODENOSCOPY (EGD);  Surgeon: Manus Gunning, MD;  Location: [facility];  Service: Gastroenterology;  Laterality: Left;  . EUS N/A 07/13/2013   Procedure: ESOPHAGEAL ENDOSCOPIC ULTRASOUND (EUS) RADIAL;  Surgeon: Arta Silence, MD;  Location: [facility]  ENDOSCOPY;  Service: Endoscopy;  Laterality: N/A;  . INGUINAL HERNIA REPAIR Right 09/29/2012   Procedure: HERNIA REPAIR INGUINAL ADULT;  Surgeon: Imogene Burn. Georgette Dover, MD;  Location: [facility] ORS;  Service: General;  Laterality: Right;  . INSERTION OF MESH Right 09/29/2012   Procedure: INSERTION OF MESH;  Surgeon: Imogene Burn. Georgette Dover, MD;  Location: [facility] ORS;  Service: General;  Laterality: Right;  . UMBILICAL HERNIA REPAIR N/A 09/29/2012   Procedure: HERNIA REPAIR UMBILICAL ADULT;  Surgeon: Imogene Burn. Georgette Dover, MD;  Location: [facility] ORS;  Service: General;  Laterality: N/A;    Family History  Problem Relation Age of Onset  . Hypertension Father   . Colon cancer Neg Hx    Social History:  reports that he has been smoking cigarettes. He has a 10.00 pack-year smoking history. He has never used smokeless tobacco. He reports previous alcohol use. He reports that he does not use drugs.  Allergies: No Known Allergies  No medications prior to admission.    No results found for this or any previous visit (from the past 48 hour(s)).  No results found.   Pertinent items are noted in HPI.  Height 5\' 10"  (1.778 m), weight 72.6 kg.  General appearance: alert, cooperative and appears stated age Head: Normocephalic, without obvious abnormality Neck: no JVD Resp: clear to auscultation bilaterally Cardio: regular rate and rhythm, S1, S2 normal, no murmur, click, rub or gallop GI: soft, non-tender; bowel sounds normal; no masses,  no organomegaly Extremities: contracture right ring  finger Pulses: 2+ and symmetric Skin: Skin color, texture, turgor normal. No rashes or lesions Neurologic: Grossly normal Incision/Wound: na  Assessment/Plan Diagnosis:  Dupuytren's contracture right ring finger   Plan: I have thoroughly discussed risks and complications including loss of the finger injury to arteries nerves tendons incomplete extension of his PIP joint. Stiffness of his digits and dystrophy numbness and tingling and  recurrence for each of the various treatment alternatives. He would like to proceed to have this taken care of. We have discussed the each of the treatments and he has elected to undergo a segmental fasciectomy to the right ring finger. Schedule as an outpatient under regional anesthesia. Is her encouraged and answered to his satisfaction. He is given the opportunity to think this over and come back with a decision as to which procedure he would like to pursue. He has declined and would like to proceed with the segmental fasciectomy.    Daryll Brod 03/01/2019, 5:36 AM

## 2019-03-01 NOTE — Transfer of Care (Signed)
Immediate Anesthesia Transfer of Care Note  Patient: Alexander Schmitt  Procedure(s) Performed: SEGMENTAL  FASCIECTOMY RIGHT RING FINGER (Right Hand)  Patient Location: PACU  Anesthesia Type:MAC combined with regional for post-op pain  Level of Consciousness: awake, alert  and oriented  Airway & Oxygen Therapy: Patient Spontanous Breathing  Post-op Assessment: Report given to RN and Post -op Vital signs reviewed and stable  Post vital signs: Reviewed and stable  Last Vitals:  Vitals Value Taken Time  BP    Temp    Pulse 70 03/01/19 0943  Resp 20 03/01/19 0943  SpO2 96 % 03/01/19 0943  Vitals shown include unvalidated device data.  Last Pain:  Vitals:   03/01/19 0722  TempSrc: Oral  PainSc: 0-No pain         Complications: No apparent anesthesia complications

## 2019-03-01 NOTE — Brief Op Note (Signed)
03/01/2019  9:42 AM  PATIENT:  [name]  68 y.o. male  PRE-OPERATIVE DIAGNOSIS:  DUPUYTREN'S  CONTRACTURE RIGHT RING FINGER  POST-OPERATIVE DIAGNOSIS:  DUPUYTREN'S  CONTRACTURE RIGHT RING FINGER  PROCEDURE:  Procedure(s) with comments: SEGMENTAL  FASCIECTOMY RIGHT RING FINGER (Right) - ANESTHESIA   AXILLAR BLOCK  SURGEON:  Surgeon(s) and Role:    * Daryll Brod, MD - Primary  PHYSICIAN ASSISTANT:   ASSISTANTS: R Dasnoit,PAC   ANESTHESIA:   regional and IV sedation  EBL: 21ml BLOOD ADMINISTERED:none  DRAINS: none   LOCAL MEDICATIONS USED:  NONE  SPECIMEN:  Excision  DISPOSITION OF SPECIMEN:  PATHOLOGY  COUNTS:  YES  TOURNIQUET:   Total Tourniquet Time Documented: Upper Arm (Right) - 43 minutes Total: Upper Arm (Right) - 43 minutes   DICTATION: .Viviann Spare Dictation  PLAN OF CARE: Discharge to home after PACU  PATIENT DISPOSITION:  PACU - hemodynamically stable.

## 2019-03-01 NOTE — Progress Notes (Signed)
Assisted Dr. Brock with right, ultrasound guided, axillary block. Side rails up, monitors on throughout procedure. See vital signs in flow sheet. Tolerated Procedure well. 

## 2019-03-01 NOTE — Anesthesia Procedure Notes (Addendum)
Anesthesia Regional Block: Axillary brachial plexus block   Pre-Anesthetic Checklist: ,, timeout performed, Correct Patient, Correct Site, Correct Laterality, Correct Procedure, Correct Position, site marked, Risks and benefits discussed,  Surgical consent,  Pre-op evaluation,  At surgeon's request and post-op pain management  Laterality: Right  Prep: chloraprep       Needles:  Injection technique: Single-shot  Needle Type: Echogenic Needle     Needle Length: 9cm  Needle Gauge: 21     Additional Needles:   Narrative:  Start time: 03/01/2019 7:58 AM End time: 03/01/2019 8:03 AM Injection made incrementally with aspirations every 5 mL.  Performed by: Personally  Anesthesiologist: Audry Pili, MD  Additional Notes: No pain on injection. No increased resistance to injection. Injection made in 5cc increments. Good needle visualization. Patient tolerated the procedure well.

## 2019-03-01 NOTE — Anesthesia Postprocedure Evaluation (Signed)
Anesthesia Post Note  Patient: Alexander Schmitt  Procedure(s) Performed: SEGMENTAL  FASCIECTOMY RIGHT RING FINGER (Right Hand)     Patient location during evaluation: PACU Anesthesia Type: Regional Level of consciousness: awake and alert Pain management: pain level controlled Vital Signs Assessment: post-procedure vital signs reviewed and stable Respiratory status: spontaneous breathing, nonlabored ventilation and respiratory function stable Cardiovascular status: stable and blood pressure returned to baseline Anesthetic complications: no    Last Vitals:  Vitals:   03/01/19 1000 03/01/19 1005  BP: (!) 144/83   Pulse: (!) 57 (!) 54  Resp: 13 11  Temp:    SpO2: 96% 97%    Last Pain:  Vitals:   03/01/19 1000  TempSrc:   PainSc: 0-No pain                 Audry Pili

## 2019-03-01 NOTE — Discharge Instructions (Addendum)

## 2019-03-01 NOTE — Op Note (Signed)
NAME: Alexander Schmitt MEDICAL RECORD NO: [id number] DATE OF BIRTH: 04-10-1951 FACILITY: [facility] LOCATION:  SURGERY CENTER PHYSICIAN: Wynonia Sours, MD   OPERATIVE REPORT   DATE OF PROCEDURE: 03/01/19    PREOPERATIVE DIAGNOSIS:   Dupuytren's contracture right ring finger   POSTOPERATIVE DIAGNOSIS:   Same   PROCEDURE:   Manual fasciectomy right ring finger   SURGEON: Daryll Brod, M.D.   ASSISTANT: Leverne Humbles, [facility]   ANESTHESIA:  Regional with sedation   INTRAVENOUS FLUIDS:  Per anesthesia flow sheet.   ESTIMATED BLOOD LOSS:  Minimal.   COMPLICATIONS:  None.   SPECIMENS:   Palmar fascia Dupuytren's cord   TOURNIQUET TIME:    Total Tourniquet Time Documented: Upper Arm (Right) - 43 minutes Total: Upper Arm (Right) - 43 minutes    DISPOSITION:  Stable to PACU.   INDICATIONS: Patient is a 68 year old male with a history of contracture of the right ring finger secondary to Dupuytren's disease.  He has significant deformity to the MP and PIP joint.  He is aware of various treatment alternatives had of his selected a segmental fasciectomy for treatment.  He is aware of alternatives.  Pre-peri-and postoperative course been discussed along with risks and complications.  He is aware that there is no guarantee to the surgery the possibility of infection recurrence injury to arteries nerves tendons incomplete relief of symptoms and dystrophy.  He is aware of potential loss of finger following treatment.  Preoperative area the patient is seen the extremity marked by both patient and surgeon antibiotic given.  A supraclavicular block was carried out without difficulty under the anesthesia department auspices in the preoperative area.  OPERATIVE COURSE: He was brought to the operating room placed in the supine position with the right arm free.  He was prepped using ChloraPrep a three-minute dry time was allowed and a timeout taken to confirm patient procedure.  The limb was  exsanguinated with an Esmarch bandage and placed on the arm was inflated to 250 mmHg.  Multiple transverse incisions were then made over the cord.  The first being most proximal at the junction of the flexor retinaculum and palmar fascia.  This allowed isolation of the cord proximally.  Neurovascular structures deeply were protected.  The cord was isolated.  And a 1-1/2 cm segment was removed.  This was sent to pathology.  It was done with both blunt and sharp dissection.  A second incision was made transversely slightly more distal.  Was carried down through subcutaneous tissue.  The limbs of the cord were identified going down about the A1 pulley.  The neurovascular bundles on both radial and ulnar sides were identified and protected.  The cord was dissected from the skin over a segment approximately 2-1/2 cm long.  Segment was isolated taking care to protect the neurovascular bundles deeply and flexor tendon.  The limbs of the cord were then transected where the inserted dorsally.  This allowed the cord to be transected proximally and then distally and removed.  This was sent to pathology also.  A third incision was then made transversely over the proximal phalanx where a large cord was present.  The incision was made carried down through subcutaneous tissue.  The ulnar neurovascular bundle was able to be isolated easily.  The radial neurovascular bundle was not easily seen.  A large and lateral digital sheet cord was present.  Along with the central large nodule.  The dissection was carried distally beneath the skin proximally beneath  the skin.  This allowed isolation of the cord and its attachment the middle phalanx and this was dissected on the ulnar side freeing allowing the cord to be lifted and being able to visualize the neurovascular bundle which was a large spiral cord.  The lateral digital sheath was dissected free from the overlying skin the neurovascular bundle was identified the nerve was separated  from the artery this was dissected proximally the artery was then dissected free from the cord.  The large nodule was then able to be transected and removed preserving the radial neurovascular bundle intact.  This was also sent to pathology and was approximately 2 cm 2 to 2-1/2 cm in length.  The ulnar neurovascular bundle was explored found to be intact.  The wounds were copiously irrigated with saline.  The distal wound was able be almost entirely closed with interrupted and vertical mattress 4-0 nylon sutures.  The most proximal incision was able to be closed with interrupted 4-0 nylon sutures.  Thorough compressive dressing was applied.  The tourniquet was deflated.  The fingers immediately pink.  A dorsal splint was applied.  The patient was taken to the recovery room for observation in satisfactory condition.  Be discharged home to return to the hand center [facility] in 1week Tylenol with ibuprofen and Ultram for breakthrough.   Daryll Brod, MD Electronically signed, 03/01/19

## 2019-03-02 ENCOUNTER — Encounter (HOSPITAL_BASED_OUTPATIENT_CLINIC_OR_DEPARTMENT_OTHER): Payer: Self-pay | Admitting: Orthopedic Surgery

## 2020-03-11 IMAGING — CT CT HEAD W/O CM
4 series · 16 of 47 positions shown, 18 images · non-contrast
Comparison: Head CT from yesterday.

CLINICAL DATA: Followup hemorrhage.

EXAM:
CT HEAD WITHOUT CONTRAST
TECHNIQUE: Contiguous axial images were obtained from the base of the skull
through the vertex without intravenous contrast.

[Series 3: head without · axial · non-contrast · 0.41mm/px · z∈[-61,+59]mm · 7 of 32 slices shown, 9 images]
[im 4/32  brain]
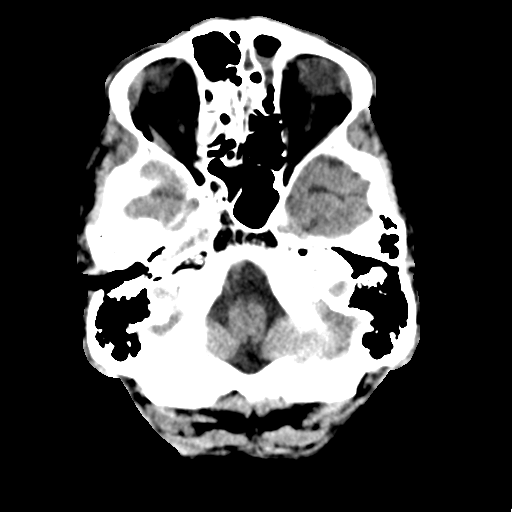
[im 4/32  bone]
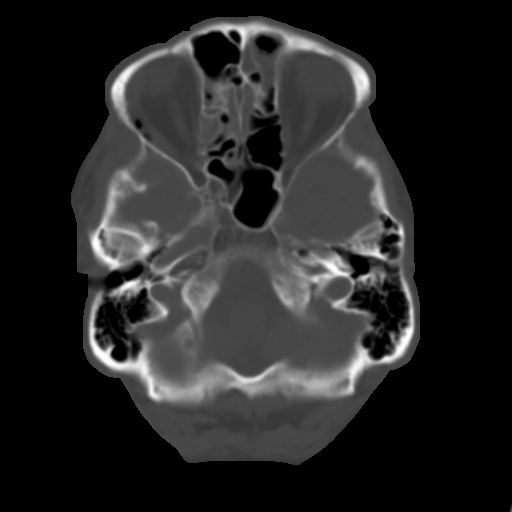
[im 8/32  brain]
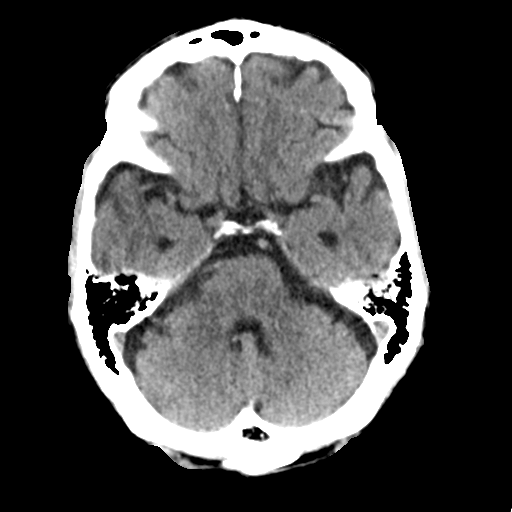
[im 12/32  brain]
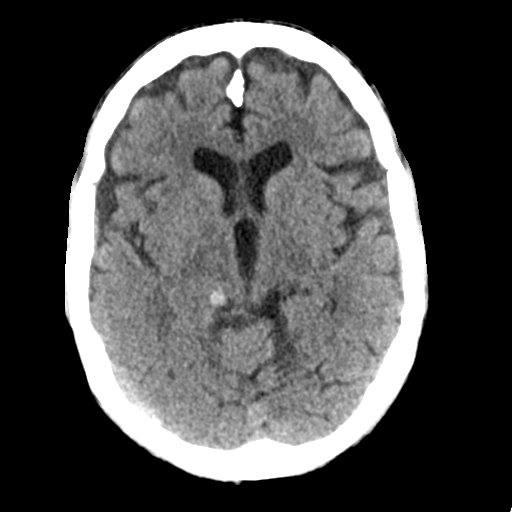
[im 16/32  brain]
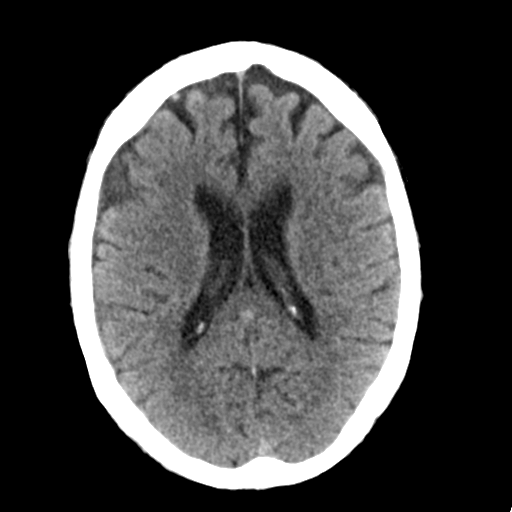
[im 20/32  brain]
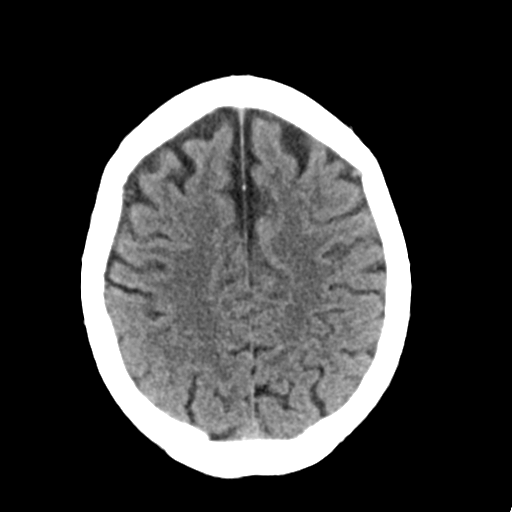
[im 20/32  bone]
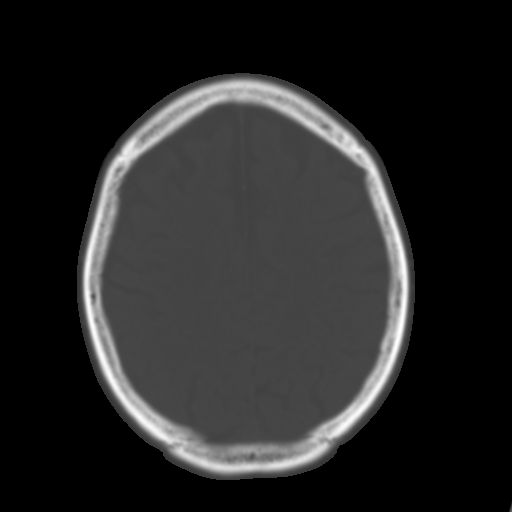
[im 24/32  brain]
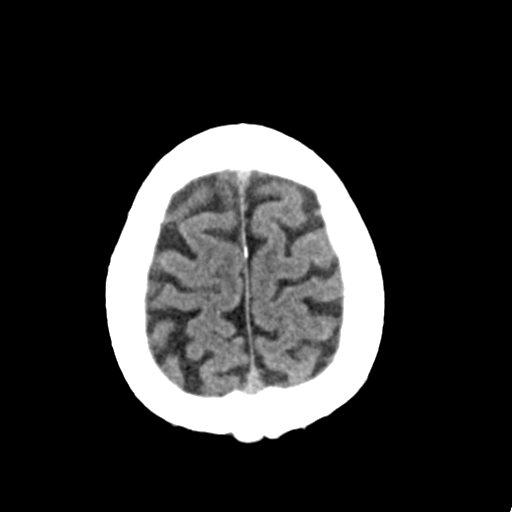
[im 28/32  brain]
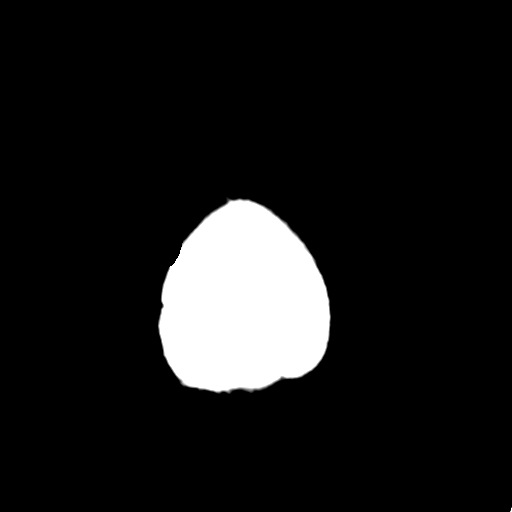

[Series 4: head bone · axial · 0.41mm/px · z∈[-62,-30]mm · 3 of 78 slices shown]
[im 8/78  bone]
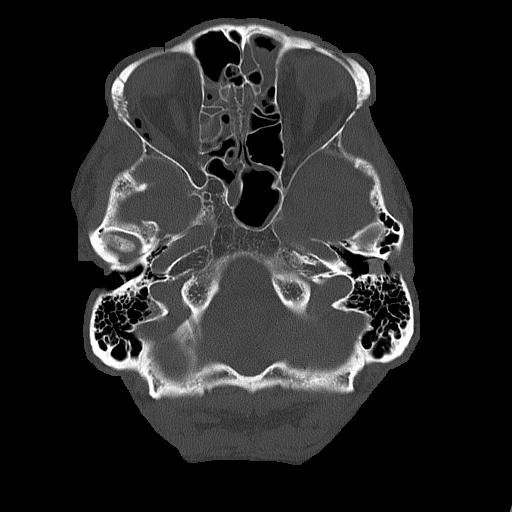
[im 16/78  bone]
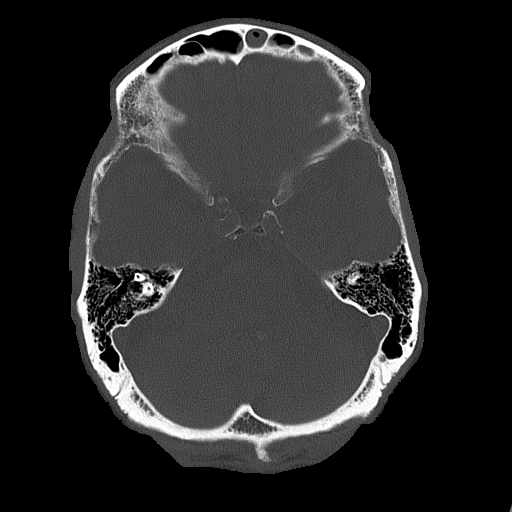
[im 24/78  bone]
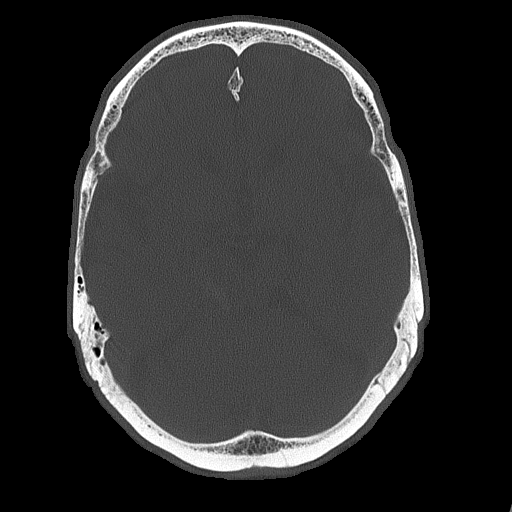

[Series 5: head without cor · coronal · non-contrast · 0.30mm/px · 3 of 67 slices shown]
[im 23/67  brain]
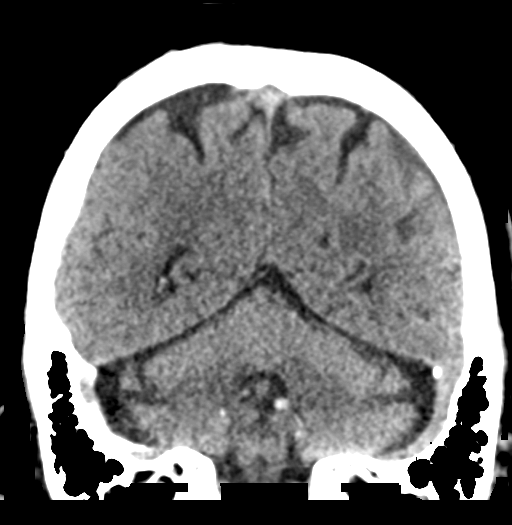
[im 30/67  brain]
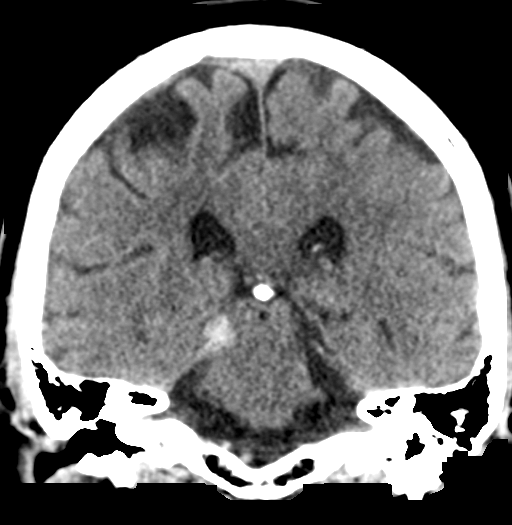
[im 37/67  brain]
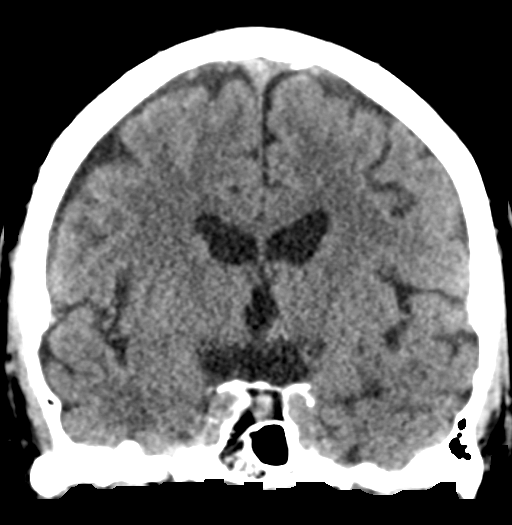

[Series 6: head without sag · sagittal · non-contrast · 0.30mm/px · 3 of 67 slices shown]
[im 23/67  brain]
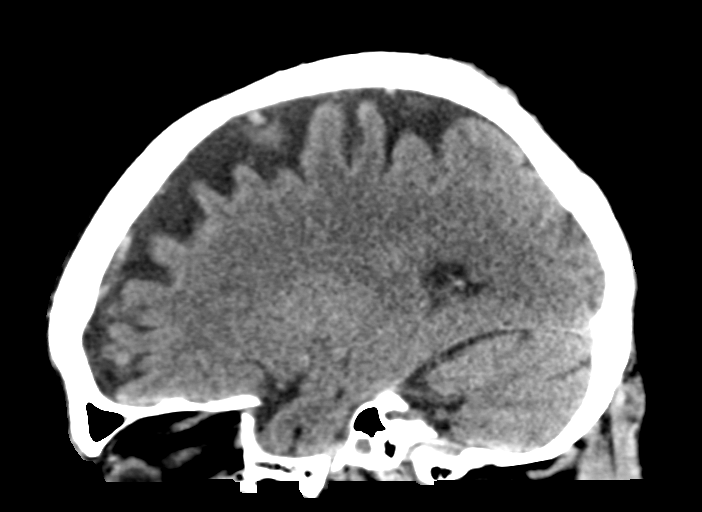
[im 34/67  brain]
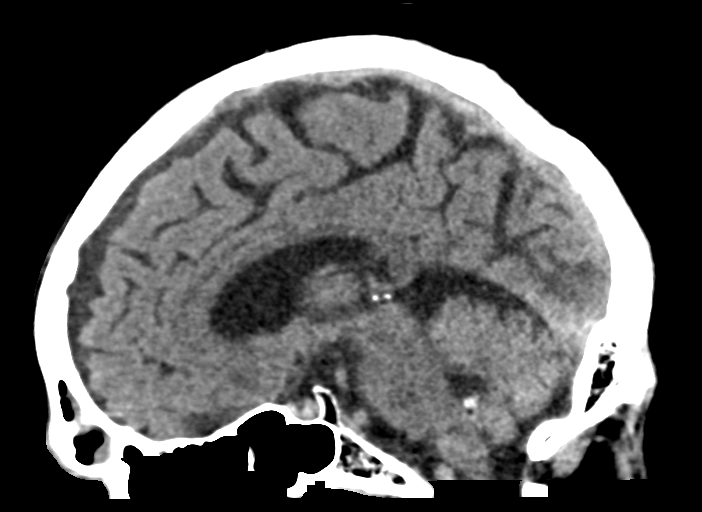
[im 45/67  brain]
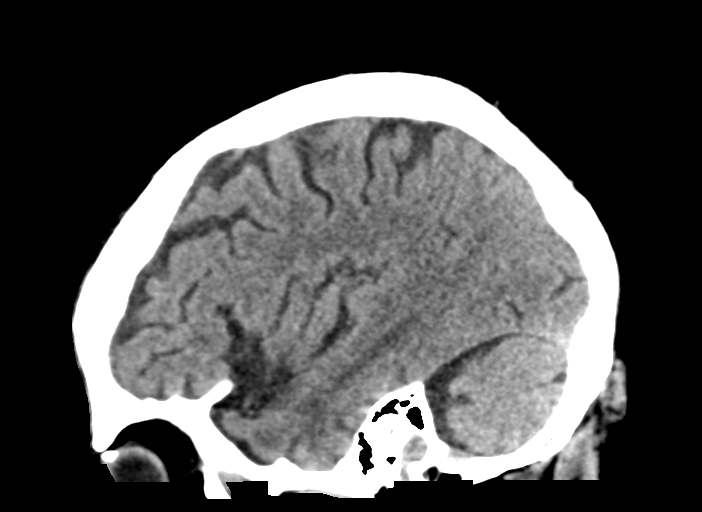

[16 of 47 positions shown; findings below may reference images not displayed]

FINDINGS: Stable area of hemorrhage near the right brainstem, likely
subarachnoid hemorrhage in the ambient cistern. I do not see any
residual subdural blood in the right temporal area.

Stable age advanced cerebral atrophy and ventriculomegaly. No
new/acute intracranial findings.

Stable right-sided facial bone fractures and sinus opacification.
IMPRESSION: 1. Stable area of hemorrhage along the right brainstem/ambient
cistern.
2. No definite residual subdural blood in the right temporal area.
3. No new/acute findings since prior head CT.

## 2020-06-26 ENCOUNTER — Encounter (HOSPITAL_COMMUNITY): Payer: Self-pay

## 2020-06-26 ENCOUNTER — Ambulatory Visit (HOSPITAL_COMMUNITY): Payer: Medicaid Other

## 2020-06-26 ENCOUNTER — Emergency Department (HOSPITAL_COMMUNITY): Payer: Medicare HMO

## 2020-06-26 ENCOUNTER — Ambulatory Visit (HOSPITAL_COMMUNITY)
Admission: EM | Admit: 2020-06-26 | Discharge: 2020-06-26 | Disposition: A | Discharge status: Home / Self Care | Payer: Medicaid Other | Attending: Emergency Medicine | Admitting: Emergency Medicine

## 2020-06-26 ENCOUNTER — Ambulatory Visit (INDEPENDENT_AMBULATORY_CARE_PROVIDER_SITE_OTHER): Payer: Medicaid Other

## 2020-06-26 ENCOUNTER — Emergency Department (HOSPITAL_COMMUNITY)
Admission: EM | Admit: 2020-06-26 | Discharge: 2020-06-26 | Disposition: A | Discharge status: Home / Self Care | Payer: Medicare HMO | Attending: Emergency Medicine | Admitting: Emergency Medicine

## 2020-06-26 ENCOUNTER — Other Ambulatory Visit: Payer: Self-pay

## 2020-06-26 DIAGNOSIS — S2241XA Multiple fractures of ribs, right side, initial encounter for closed fracture: Secondary | ICD-10-CM

## 2020-06-26 DIAGNOSIS — W010XXA Fall on same level from slipping, tripping and stumbling without subsequent striking against object, initial encounter: Secondary | ICD-10-CM | POA: Insufficient documentation

## 2020-06-26 DIAGNOSIS — I10 Essential (primary) hypertension: Secondary | ICD-10-CM | POA: Insufficient documentation

## 2020-06-26 DIAGNOSIS — S270XXA Traumatic pneumothorax, initial encounter: Secondary | ICD-10-CM

## 2020-06-26 DIAGNOSIS — S0990XA Unspecified injury of head, initial encounter: Secondary | ICD-10-CM | POA: Insufficient documentation

## 2020-06-26 DIAGNOSIS — F17210 Nicotine dependence, cigarettes, uncomplicated: Secondary | ICD-10-CM | POA: Insufficient documentation

## 2020-06-26 DIAGNOSIS — S299XXA Unspecified injury of thorax, initial encounter: Secondary | ICD-10-CM | POA: Diagnosis present

## 2020-06-26 DIAGNOSIS — J939 Pneumothorax, unspecified: Secondary | ICD-10-CM

## 2020-06-26 DIAGNOSIS — W19XXXA Unspecified fall, initial encounter: Secondary | ICD-10-CM

## 2020-06-26 MED ORDER — KETOROLAC TROMETHAMINE 60 MG/2ML IM SOLN
30.0000 mg | Freq: Once | INTRAMUSCULAR | Status: AC
  Administered 2020-06-26: 30 mg via INTRAMUSCULAR

## 2020-06-26 MED ORDER — HYDROCODONE-ACETAMINOPHEN 5-325 MG PO TABS: 1.0000 | ORAL_TABLET | Freq: Four times a day (QID) | ORAL | 0 refills | Status: DC | PRN

## 2020-06-26 MED ORDER — KETOROLAC TROMETHAMINE 30 MG/ML IJ SOLN
INTRAMUSCULAR | Status: AC
  Filled 2020-06-26: qty 1

## 2020-06-26 NOTE — Discharge Instructions (Signed)
Please use the incentive spirometer 10 breaths every hour while you are awake.    Prescription given for Norco. Take medication as directed and do not operate machinery, drive a car, or work while taking this medication as it can make you drowsy.   Please make an appointment to follow-up with your regular doctor in the next 2 to 3 days for reassessment.  Please return to the emergency department for any new or worsening symptoms including any chest pain, shortness of breath, cough or fevers.

## 2020-06-26 NOTE — ED Provider Notes (Signed)
HPI  SUBJECTIVE:  Alexander Schmitt is a 69 y.o. male who presents with 3 days of right posterior chest pain described as a 10 out of 10 muscle spasm.  He states that he fell on some slippery steps, hitting his right posterior chest on the steps.  He denies hitting his head, loss of consciousness, headache, neck pain, arm or leg weakness.  Has not noticed any bruising over his chest.  No coughing, wheezing, shortness of breath, hemoptysis, dyspnea on exertion.  He tried Tylenol, muscle relaxants, heat and ice without improvement in his symptoms.  Symptoms are worse with coughing, deep breath then, torso rotation to the left.  He has a past medical history of alcoholism, states that he still drinks "a little bit".  He has a history of cirrhosis, hypertension, pancreatitis, splenic vein thrombosis, upper GI bleed, subdural hematoma.  He is a smoker.  No history of asthma, emphysema, COPD, chronic kidney disease, diabetes.  PMD:Inc, [facility]   Past Medical History:  Diagnosis Date  . Alcoholism ([place]) since before 2013  . Anemia 08/2015   PRBC x 2 units 08/2015.   . Arthritis    left shoulder   . Ascites 11/2005, 05/2012   05/2012:4 liter paracentesis, no SBP   . Cirrhosis ([place]) 2013  . Colon polyp 09/07/15   ascenging colon, minor/scattered diverticulosis on colonoscopy  . GERD (gastroesophageal reflux disease)    occasional   . History of stomach ulcers   . Hypertension   . Pancreatitis 11/4268   acute alcoholic and chronic; pseudocyst and pancreatic calcifications 11/2011  . SBO (small bowel obstruction) ([place]) 11/2005   medical mgt  . 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 COLONOSCOPY N/A 09/07/2015   Procedure: COLONOSCOPY;  Surgeon: Irene Shipper, MD;  Location: [facility] ENDOSCOPY;  Service: Endoscopy;  Laterality: N/A;  . COLONOSCOPY    . ESOPHAGOGASTRODUODENOSCOPY Left  09/02/2015   Procedure: ESOPHAGOGASTRODUODENOSCOPY (EGD);  Surgeon: Manus Gunning, MD;  Location: [place];  Service: Gastroenterology;  Laterality: Left;  . EUS N/A 07/13/2013   Procedure: ESOPHAGEAL ENDOSCOPIC ULTRASOUND (EUS) RADIAL;  Surgeon: Arta Silence, MD;  Location: [facility] ENDOSCOPY;  Service: Endoscopy;  Laterality: N/A;  . FASCIECTOMY Right 03/01/2019   Procedure: SEGMENTAL  FASCIECTOMY RIGHT RING FINGER;  Surgeon: Daryll Brod, MD;  Location: [place];  Service: Orthopedics;  Laterality: Right;  ANESTHESIA   AXILLAR BLOCK  . INGUINAL HERNIA REPAIR Right 09/29/2012   Procedure: HERNIA REPAIR INGUINAL ADULT;  Surgeon: Imogene Burn. Georgette Dover, MD;  Location: [facility] ORS;  Service: General;  Laterality: Right;  . INSERTION OF MESH Right 09/29/2012   Procedure: INSERTION OF MESH;  Surgeon: Imogene Burn. Georgette Dover, MD;  Location: [facility] ORS;  Service: General;  Laterality: Right;  . UMBILICAL HERNIA REPAIR N/A 09/29/2012   Procedure: HERNIA REPAIR UMBILICAL ADULT;  Surgeon: Imogene Burn. Georgette Dover, MD;  Location: [facility] ORS;  Service: General;  Laterality: N/A;    Family History  Problem Relation Age of Onset  . Hypertension Father   . Colon cancer Neg Hx     Social History   Tobacco Use  . Smoking status: Light Tobacco Smoker    Packs/day: 0.25    Years: 40.00    Pack years: 10.00    Types: Cigarettes  . Smokeless tobacco: Never Used  . Tobacco comment: 2-3 cigarettes per day  Vaping Use  . Vaping Use: Never used  Substance Use Topics  . Alcohol use: Not Currently    Alcohol/week: 0.0 standard drinks  . Drug use: No     Current Facility-Administered Medications:  .  ketorolac (TORADOL) injection 30 mg, 30 mg, Intramuscular, Once, Melynda Ripple, MD  Current Outpatient Medications:  .  [employer] D3 25 MCG (1000 UT) capsule, Take 1,000 Units by mouth daily., Disp: , Rfl:  .  tamsulosin (FLOMAX) 0.4 MG CAPS capsule, Take 0.4 mg by mouth., Disp: , Rfl:  .  folic acid (FOLVITE) 1 MG  tablet, Take 1 mg by mouth daily., Disp: , Rfl:  .  hydrochlorothiazide (HYDRODIURIL) 25 MG tablet, Take 25 mg by mouth daily., Disp: , Rfl:   No Known Allergies   ROS  As noted in HPI.   Physical Exam  BP 136/76 (BP Location: Right Arm)   Pulse 72   Temp 97.8 F (36.6 C)   Resp 16   Ht 6' (1.829 m)   Wt 65.8 kg   SpO2 99%   BMI 19.67 kg/m   Constitutional: Well developed, well nourished, no acute distress Eyes:  EOMI, conjunctiva normal bilaterally HENT: Normocephalic, atraumatic,mucus membranes moist Respiratory: Limited inspiratory effort.  No bruising to the chest wall.  Positive exquisite tenderness over the posterior right ninth and 10th ribs.  No appreciable crepitus..  Lungs clear bilaterally. Cardiovascular: Normal rate regular rhythm no murmurs rubs or gallops GI: nondistended skin: No rash, skin intact Musculoskeletal: No C-spine, T-spine, L-spine tenderness.  Moving all extremities equally. Neurologic: Alert & oriented x 3, no focal neuro deficits Psychiatric: Speech and behavior appropriate   ED Course   Medications  ketorolac (TORADOL) injection 30 mg (has no administration in time range)    Orders Placed This Encounter  Procedures  . DG Ribs Unilateral W/Chest Right    Standing Status:   Standing    Number of Occurrences:   1    Order Specific Question:   Reason for Exam (SYMPTOM  OR DIAGNOSIS REQUIRED)    Answer:   fall r/o fx ptx    No results found for this or any previous visit (from the past 24 hour(s)). DG Ribs Unilateral W/Chest Right  Result Date: 06/26/2020 CLINICAL DATA:  Fall.  Evaluate for pneumothorax EXAM: RIGHT RIBS AND CHEST - 3+ VIEW COMPARISON:  08/14/2018 FINDINGS: The heart size is normal. No pleural effusion identified. Small right upper lobe pneumothorax measures 6 mm in thickness. Right posterior ninth and tenth rib fractures identified. No airspace consolidation identified. Trace volume of right pleural fluid is noted.  IMPRESSION: 1. Small right upper lobe pneumothorax.  Trace right pleural fluid. 2. Right posterior ninth and tenth rib fractures. Electronically Signed   By: Kerby Moors M.D.   On: 06/26/2020 13:11    ED Clinical Impression  1. Traumatic pneumothorax, initial encounter   2. Closed fracture of multiple ribs of right side, initial encounter      ED Assessment/Plan   Reviewed imaging independently and discussed with radiology..  Small right upper lobe pneumothorax.  Trace right pleural fluid.  Right posterior ninth and 10th rib fracture see radiology report for full details.  Patient with traumatic pneumothorax status post 2 rib fractures.  Doubt head injury.  Transferring to the emergency department for observation, repeated chest x-ray, oxygen, pain control, labs if necessary.  Giving 30 mg Toradol IM x1 here feel that he is stable to go by private vehicle.  Advised patient to not  leave before ER evaluation is complete.  Discussed with him concerns of worsening pneumothorax.  Discussed imaging, MDM, treatment plan, and plan for follow-up with patient. Discussed sn/sx that should prompt return to the ED. patient agrees with plan.   Meds ordered this encounter  Medications  . ketorolac (TORADOL) injection 30 mg    *This clinic note was created using [employer]. Therefore, there may be occasional mistakes despite careful proofreading.   ?    Melynda Ripple, MD 06/26/20 1351

## 2020-06-26 NOTE — ED Triage Notes (Addendum)
Patient reports that he slipped on wet porch this past Saturday. Patient sent from UC for further evaluation of fractured ribs on right. Patient speaking complete sentences, pain with ROM.PER xray small pneumo

## 2020-06-26 NOTE — ED Triage Notes (Signed)
Pt reports on SAt he fell on out side on concrete steps. P now has pain to RT rib area that increases with movement.

## 2020-06-26 NOTE — Discharge Instructions (Addendum)
You have broken your ninth and 10th rib and have a small collapsed lung.  I am concerned because that this collapsed lung could get bigger, which can make you very sick.  I am sending you to the ED for observation, repeated chest x-ray, pain control, and any other evaluation they feel appropriate.  I have given you 30 mg of Toradol here.

## 2020-06-26 NOTE — ED Provider Notes (Signed)
[facility] EMERGENCY DEPARTMENT Provider Note   CSN: [id number] Arrival date & time: 06/26/20  1357     History No chief complaint on file.   Alexander Schmitt is a 69 y.o. male.  HPI   69 year old male with a history of anemia, arthritis, cirrhosis, GERD, hypertension, pancreatitis, small bowel obstruction, splenic vein thrombosis, upper GI bleed, who presents to the emergency department today for eval after a fall.  Patient states that 4 to 5 days ago he slipped and fell backwards landing on his back.  Denies any head trauma or LOC.  He is not anticoagulated and has had no neuro complaints or headaches.  Since then he has had pain in his right mid back.  He was seen in urgent care prior to arrival and had a chest x-ray completed which showed 2 rib fractures and a small pneumothorax so he was sent here for further evaluation.  He denies any shortness of breath.  He was given pain medication while in urgent care and he states his symptoms are much improved following this.  Past Medical History:  Diagnosis Date  . Alcoholism ([place]) since before 2013  . Anemia 08/2015   PRBC x 2 units 08/2015.   . Arthritis    left shoulder   . Ascites 11/2005, 05/2012   05/2012:4 liter paracentesis, no SBP   . Cirrhosis ([place]) 2013  . Colon polyp 09/07/15   ascenging colon, minor/scattered diverticulosis on colonoscopy  . GERD (gastroesophageal reflux disease)    occasional   . History of stomach ulcers   . Hypertension   . Pancreatitis [id number]   acute alcoholic and chronic; pseudocyst and pancreatic calcifications 11/2011  . SBO (small bowel obstruction) ([place]) 11/2005   medical mgt  . 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 Upper GI bleed 08/2015   suspected gastric varices;     Patient Active Problem List   Diagnosis Date Noted  . SDH (subdural hematoma) ([place]) 08/14/2018  . Benign neoplasm of ascending colon   . Abdominal discomfort   . Melena   . Splenic vein thrombosis   .  Asplenia   . Abnormal CT of the abdomen   . Gastric varices   . Pancreatic mass   . Acute blood loss anemia   . UGI bleed 09/02/2015  . Alcohol abuse 09/02/2015  . Abdominal pain in male   . Protein-calorie malnutrition, severe ([place]) 02/04/2014  . 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 Malnutrition of moderate degree ([place]) 05/04/2013  . Pancreatic pseudocyst 05/04/2013  . Alcohol abuse, in remission 05/03/2013  . Right inguinal hernia 09/20/2012  . Umbilical hernia [id number]  . Ascites 05/24/2012  . Cirrhosis ([place]) 05/24/2012  . 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 COLONOSCOPY N/A 09/07/2015   Procedure: COLONOSCOPY;  Surgeon: Irene Shipper, MD;  Location: [facility] ENDOSCOPY;  Service: Endoscopy;  Laterality: N/A;  . COLONOSCOPY    . ESOPHAGOGASTRODUODENOSCOPY Left 09/02/2015   Procedure: ESOPHAGOGASTRODUODENOSCOPY (EGD);  Surgeon: Manus Gunning, MD;  Location: [facility];  Service: Gastroenterology;  Laterality: Left;  . EUS N/A 07/13/2013   Procedure: ESOPHAGEAL ENDOSCOPIC ULTRASOUND (EUS) RADIAL;  Surgeon: Arta Silence, MD;  Location: [facility] ENDOSCOPY;  Service: Endoscopy;  Laterality: N/A;  . FASCIECTOMY Right 03/01/2019   Procedure: SEGMENTAL  FASCIECTOMY RIGHT RING FINGER;  Surgeon: Daryll Brod, MD;  Location: [facility];  Service: Orthopedics;  Laterality: Right;  ANESTHESIA   AXILLAR BLOCK  .  INGUINAL HERNIA REPAIR Right 09/29/2012   Procedure: HERNIA REPAIR INGUINAL ADULT;  Surgeon: Imogene Burn. Georgette Dover, MD;  Location: [facility] ORS;  Service: General;  Laterality: Right;  . INSERTION OF MESH Right 09/29/2012   Procedure: INSERTION OF MESH;  Surgeon: Imogene Burn. Georgette Dover, MD;  Location: [facility]S;  Service: General;  Laterality: Right;  . UMBILICAL HERNIA REPAIR N/A 09/29/2012   Procedure: HERNIA REPAIR UMBILICAL ADULT;  Surgeon: Imogene Burn. Georgette Dover, MD;  Location: [facility];  Service: General;  Laterality: N/A;        Family History  Problem Relation Age of Onset  . Hypertension Father   . Colon cancer Neg Hx     Social History   Tobacco Use  . Smoking status: Light Tobacco Smoker    Packs/day: 0.25    Years: 40.00    Pack years: 10.00    Types: Cigarettes  . Smokeless tobacco: Never Used  . Tobacco comment: 2-3 cigarettes per day  Vaping Use  . Vaping Use: Never used  Substance Use Topics  . Alcohol use: Not Currently    Alcohol/week: 0.0 standard drinks  . Drug use: No    Home Medications Prior to Admission medications   Medication Sig Start Date End Date Taking? Authorizing Provider  [employer] D3 25 MCG (1000 UT) capsule Take 1,000 Units by mouth daily. 05/21/18  Yes [provider]  folic acid (FOLVITE) 1 MG tablet Take 1 mg by mouth daily. 05/21/18  Yes [provider]  tamsulosin (FLOMAX) 0.4 MG CAPS capsule Take 0.4 mg by mouth daily after breakfast.   Yes Joline Salt, RN  thiamine 100 MG tablet Take 100 mg by mouth daily. 03/19/20  Yes [provider]  HYDROcodone-acetaminophen (NORCO/VICODIN) 5-325 MG tablet Take 1 tablet by mouth every 6 (six) hours as needed. 06/26/20   Gia Lusher S, PA-C    Allergies    Patient has no known allergies.  Review of Systems   Review of Systems  Constitutional: Negative for fever.  HENT: Negative for ear pain and sore throat.   Eyes: Negative for visual disturbance.  Respiratory: Negative for cough and shortness of breath.   Cardiovascular: Positive for chest pain (posterior chest wall pain).  Gastrointestinal: Negative for abdominal pain, constipation, diarrhea, nausea and vomiting.  Genitourinary: Negative for dysuria and hematuria.  Musculoskeletal: Negative for back pain and neck pain.  Skin: Negative for color change and rash.  Neurological: Negative for weakness and numbness.       No head trauma or loc  All other systems reviewed and are negative.   Physical Exam Updated Vital Signs BP (!)  157/83   Pulse 64   Temp 98 F (36.7 C) (Oral)   Resp (!) 21   Ht 6' (1.829 m)   Wt 65.8 kg   SpO2 97%   BMI 19.67 kg/m   Physical Exam Vitals and nursing note reviewed.  Constitutional:      Appearance: He is well-developed and well-nourished.  HENT:     Head: Normocephalic and atraumatic.  Eyes:     Conjunctiva/sclera: Conjunctivae normal.  Cardiovascular:     Rate and Rhythm: Normal rate and regular rhythm.     Heart sounds: Normal heart sounds. No murmur heard.   Pulmonary:     Effort: Pulmonary effort is normal. No respiratory distress.     Comments: Decreased breath sounds to RUL fields Chest:     Chest wall: Tenderness (right posterior chest wall TTP) present.  Abdominal:  General: Bowel sounds are normal.     Palpations: Abdomen is soft.     Tenderness: There is no abdominal tenderness. There is no guarding or rebound.  Musculoskeletal:        General: No edema.     Cervical back: Neck supple.     Comments: No CTL spine TTP  Skin:    General: Skin is warm and dry.  Neurological:     Mental Status: He is alert.  Psychiatric:        Mood and Affect: Mood and affect normal.     ED Results / Procedures / Treatments   Labs (all labs ordered are listed, but only abnormal results are displayed) Labs Reviewed - No data to display  EKG None  Radiology DG Ribs Unilateral W/Chest Right  Result Date: 06/26/2020 CLINICAL DATA:  Fall.  Evaluate for pneumothorax EXAM: RIGHT RIBS AND CHEST - 3+ VIEW COMPARISON:  08/14/2018 FINDINGS: The heart size is normal. No pleural effusion identified. Small right upper lobe pneumothorax measures 6 mm in thickness. Right posterior ninth and tenth rib fractures identified. No airspace consolidation identified. Trace volume of right pleural fluid is noted. IMPRESSION: 1. Small right upper lobe pneumothorax.  Trace right pleural fluid. 2. Right posterior ninth and tenth rib fractures. Electronically Signed   By: Kerby Moors  M.D.   On: 06/26/2020 13:11   CT Head Wo Contrast  Result Date: 06/26/2020 CLINICAL DATA:  Head trauma, fell EXAM: CT HEAD WITHOUT CONTRAST TECHNIQUE: Contiguous axial images were obtained from the base of the skull through the vertex without intravenous contrast. COMPARISON:  08/15/2018 FINDINGS: Brain: No acute infarct or hemorrhage. Lateral ventricles and midline structures are unremarkable. No acute extra-axial fluid collections. No mass effect. Vascular: No hyperdense vessel or unexpected calcification. Skull: Normal. Negative for fracture or focal lesion. Sinuses/Orbits: No acute finding. Other: None. IMPRESSION: 1. No acute intracranial process. Electronically Signed   By: Randa Ngo M.D.   On: 06/26/2020 18:57    Procedures Procedures (including critical care time)  Medications Ordered in ED Medications - No data to display  ED Course  I have reviewed the triage vital signs and the nursing notes.  Pertinent labs & imaging results that were available during my care of the patient were reviewed by me and considered in my medical decision making (see chart for details).    MDM Rules/Calculators/A&P                          69 year old male presenting the emergency department today for evaluation of rib fractures and pneumothorax that occurred after a fall 5 days ago.  Patient satting well on room air, no acute distress on my evaluation and declines additional pain medications in the ED.  Reviewed/interpreted chest x-ray ordered at urgent care.  There is a small right upper lobe pneumothorax and some trace right pleural fluid.  No pneumonia noted.  Right posterior ninth and 10th rib fractures also noted. CT scan of the head also obtained given patient has history of subdural and this was negative for any acute traumatic findings.  5:36 PM CONSULT with Dr. Grandville Silos of trauma surgery who states that patient likely safe for discharge home and can follow-up as an outpatient with his  PCP.  Recommends pulmonary toilet.  Reassessed patient.  Discussed plan for discharge with incentive spirometer, pain medications and plan to follow-up with PCP.  Advised on specific return precautions for any new or worsening  symptoms.  He voices understanding of the plan and reasons to return.  Questions answered.  Patient stable for discharge  Final Clinical Impression(s) / ED Diagnoses Final diagnoses:  Closed fracture of multiple ribs of right side, initial encounter  Pneumothorax, unspecified type  Fall, initial encounter    Rx / DC Orders ED Discharge Orders         Ordered    HYDROcodone-acetaminophen (NORCO/VICODIN) 5-325 MG tablet  Every 6 hours PRN        06/26/20 1930           Rodney Booze, PA-C 06/26/20 2307    Lorelle Gibbs, DO 06/27/20 1515

## 2021-02-22 ENCOUNTER — Encounter: Payer: Self-pay | Admitting: Gastroenterology

## 2021-03-18 ENCOUNTER — Other Ambulatory Visit: Payer: Self-pay

## 2021-03-18 ENCOUNTER — Emergency Department (HOSPITAL_COMMUNITY): Payer: Medicare HMO

## 2021-03-18 ENCOUNTER — Emergency Department (HOSPITAL_COMMUNITY)
Admission: EM | Admit: 2021-03-18 | Discharge: 2021-03-18 | Disposition: A | Discharge status: Home / Self Care | Payer: Medicare HMO | Attending: Emergency Medicine | Admitting: Emergency Medicine

## 2021-03-18 ENCOUNTER — Encounter (HOSPITAL_COMMUNITY): Payer: Self-pay | Admitting: *Deleted

## 2021-03-18 DIAGNOSIS — R0781 Pleurodynia: Secondary | ICD-10-CM | POA: Diagnosis not present

## 2021-03-18 DIAGNOSIS — Y92000 Kitchen of unspecified non-institutional (private) residence as  the place of occurrence of the external cause: Secondary | ICD-10-CM | POA: Insufficient documentation

## 2021-03-18 DIAGNOSIS — S01511A Laceration without foreign body of lip, initial encounter: Secondary | ICD-10-CM | POA: Diagnosis not present

## 2021-03-18 DIAGNOSIS — Z23 Encounter for immunization: Secondary | ICD-10-CM | POA: Diagnosis not present

## 2021-03-18 DIAGNOSIS — W01198A Fall on same level from slipping, tripping and stumbling with subsequent striking against other object, initial encounter: Secondary | ICD-10-CM | POA: Insufficient documentation

## 2021-03-18 DIAGNOSIS — Z85038 Personal history of other malignant neoplasm of large intestine: Secondary | ICD-10-CM | POA: Diagnosis not present

## 2021-03-18 DIAGNOSIS — I10 Essential (primary) hypertension: Secondary | ICD-10-CM | POA: Insufficient documentation

## 2021-03-18 DIAGNOSIS — F17210 Nicotine dependence, cigarettes, uncomplicated: Secondary | ICD-10-CM | POA: Diagnosis not present

## 2021-03-18 DIAGNOSIS — S0993XA Unspecified injury of face, initial encounter: Secondary | ICD-10-CM | POA: Diagnosis present

## 2021-03-18 MED ORDER — TETANUS-DIPHTH-ACELL PERTUSSIS 5-2.5-18.5 LF-MCG/0.5 IM SUSY
0.5000 mL | PREFILLED_SYRINGE | Freq: Once | INTRAMUSCULAR | Status: AC
  Administered 2021-03-18: 0.5 mL via INTRAMUSCULAR
  Filled 2021-03-18: qty 0.5

## 2021-03-18 MED ORDER — AMOXICILLIN-POT CLAVULANATE 875-125 MG PO TABS: 1.0000 | ORAL_TABLET | Freq: Two times a day (BID) | ORAL | 0 refills | Status: AC

## 2021-03-18 MED ORDER — CHLORHEXIDINE GLUCONATE 0.12 % MT SOLN: 15.0000 mL | Freq: Two times a day (BID) | OROMUCOSAL | 0 refills | Status: DC

## 2021-03-18 MED ORDER — ACETAMINOPHEN 325 MG PO TABS
650.0000 mg | ORAL_TABLET | Freq: Once | ORAL | Status: AC
  Administered 2021-03-18: 650 mg via ORAL
  Filled 2021-03-18: qty 2

## 2021-03-18 MED ORDER — LIDOCAINE HCL (PF) 1 % IJ SOLN
5.0000 mL | Freq: Once | INTRAMUSCULAR | Status: AC
  Administered 2021-03-18: 5 mL
  Filled 2021-03-18: qty 5

## 2021-03-18 NOTE — ED Triage Notes (Signed)
Pt reports falling last night, tripped on water. Denies loc. Has large laceration to upper lip. Unknown tetanus.

## 2021-03-18 NOTE — ED Provider Notes (Signed)
Emergency Medicine Provider Triage Evaluation Note  Alexander Schmitt , a 70 y.o. male  was evaluated in triage.  Pt complains of mechanical fall last night or slipping on water in his kitchen where he tripped and fell and hit his face on the corner of his kitchen table.  No LOC, nausea, vomiting, blurry or double vision but does have a large laceration to the internal lip.  Pain in the left ribs as well..  Review of Systems  Positive: Laceration, rib pain Negative: LOC, blurry or double vision, nausea, vomiting  Physical Exam  BP 135/82   Pulse 81   Temp 98.7 F (37.1 C) (Oral)   Resp 16   SpO2 98%  Gen:   Awake, no distress   Resp:  Normal effort  MSK:   Moves extremities without difficulty  Other:  RRR no M/R/G.  Large laceration to the internal lip with flap of tissue falling down into the mouth.  Hemostatic at this time.  No tracheal deviation.  No C-spine midline tenderness palpation.  Forage motion of the neck.  Tenderness palpation over the anterior lateral ribs on the left.  Medical Decision Making  Medically screening exam initiated at 12:14 PM.  Appropriate orders placed.  [name] was informed that the remainder of the evaluation will be completed by another provider, this initial triage assessment does not replace that evaluation, and the importance of remaining in the ED until their evaluation is complete.  This chart was dictated using voice recognition software, Dragon. Despite the best efforts of this provider to proofread and correct errors, errors may still occur which can change documentation meaning.     Emeline Darling, PA-C 03/18/21 1215    Jeanell Sparrow, DO 03/18/21 1811

## 2021-03-18 NOTE — Discharge Instructions (Addendum)
Take the antibiotics as prescribed.  Rinse your mouth with the antibiotic solution.  Follow-up with the plastic surgeon to make sure the laceration heals properly

## 2021-03-18 NOTE — ED Notes (Signed)
E-signature pad unavailable at time of pt discharge. This RN discussed discharge materials with pt and answered all pt questions. Pt stated understanding of discharge material. ? ?

## 2021-03-18 NOTE — ED Provider Notes (Signed)
[facility] EMERGENCY DEPARTMENT Provider Note   CSN: [id number] Arrival date & time: 03/18/21  1117     History Chief Complaint  Patient presents with   Alexander Schmitt is a 70 y.o. male.   Fall   Patient states he had a fall yesterday evening when he slipped in the kitchen.  He ended up falling hit his face on the corner of the kitchen table.  Patient denies any loss of consciousness.  No nausea vomiting.  No double vision.  Patient did sustain a laceration to his upper lip.  He ended up coming to the ED today because it is hard for him to eat.  He also has some pain in his ribs but no difficulty breathing  Past Medical History:  Diagnosis Date   Alcoholism ([place]) since before 2013   Anemia 08/2015   PRBC x 2 units 08/2015.    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   Upper GI bleed 08/2015   suspected gastric varices;     Patient Active Problem List   Diagnosis Date Noted   SDH (subdural hematoma) ([place]) 08/14/2018   Benign neoplasm of ascending colon    Abdominal discomfort    Melena    Splenic vein thrombosis    Asplenia    Abnormal CT of the abdomen    Gastric varices    Pancreatic mass    Acute blood loss anemia    UGI bleed 09/02/2015   Alcohol abuse 09/02/2015   Abdominal pain in male    Protein-calorie malnutrition, severe ([place]) [id number]   Alcoholic pancreatitis [id number]   Malnutrition of moderate degree ([place]) 05/04/2013   Pancreatic pseudocyst 05/04/2013   Alcohol  abuse, in remission 05/03/2013   Right inguinal hernia [id number]   Umbilical hernia 123456   Ascites 05/24/2012   Cirrhosis ([place]) 05/24/2012   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    Past Surgical History:  Procedure Laterality Date   COLONOSCOPY N/A 09/07/2015   Procedure: COLONOSCOPY;  Surgeon: Irene Shipper, MD;  Location: [facility];  Service: Endoscopy;  Laterality: N/A;   COLONOSCOPY     ESOPHAGOGASTRODUODENOSCOPY Left 09/02/2015   Procedure: ESOPHAGOGASTRODUODENOSCOPY (EGD);  Surgeon: Manus Gunning, MD;  Location: [facility];  Service: Gastroenterology;  Laterality: Left;   EUS N/A 07/13/2013   Procedure: ESOPHAGEAL ENDOSCOPIC ULTRASOUND (EUS) RADIAL;  Surgeon: Arta Silence, MD;  [facility];  Service: Endoscopy;  Laterality: N/A;   FASCIECTOMY Right 03/01/2019   Procedure: SEGMENTAL  FASCIECTOMY RIGHT RING FINGER;  Surgeon: Daryll Brod, MD;  Location: [facility];  Service: Orthopedics;  Laterality: Right;  ANESTHESIA   AXILLAR BLOCK   INGUINAL HERNIA REPAIR Right 09/29/2012   Procedure: HERNIA REPAIR INGUINAL ADULT;  Surgeon: Imogene Burn. Georgette Dover, MD;  Location[facility];  Service: General;  Laterality: Right;   INSERTION OF MESH Right 09/29/2012   Procedure: INSERTION OF MESH;  Surgeon: Imogene Burn. Tsuei, MD;  [facility];  Service:  General;  Laterality: Right;   UMBILICAL HERNIA REPAIR N/A 09/29/2012   Procedure: HERNIA REPAIR UMBILICAL ADULT;  Surgeon: Imogene Burn. Georgette Dover, MD;  [facility];  Service: General;  Laterality: N/A;       Family History  Problem Relation Age of Onset   Hypertension Father    Colon cancer Neg Hx     Social History   Tobacco Use   Smoking status: Light Smoker    Packs/day: 0.25    Years: 40.00    Pack years: 10.00    Types: Cigarettes   Smokeless tobacco: Never   Tobacco comments:    2-3 cigarettes per day  Vaping Use   Vaping Use: Never used  Substance Use Topics   Alcohol use: Not  Currently    Alcohol/week: 0.0 standard drinks   Drug use: No    Home Medications Prior to Admission medications   Medication Sig Start Date End Date Taking? Authorizing Provider  amoxicillin-clavulanate (AUGMENTIN) 875-125 MG tablet Take 1 tablet by mouth 2 (two) times daily for 5 days. 03/18/21 03/23/21 Yes Dorie Rank, MD  chlorhexidine (PERIDEX) 0.12 % solution Use as directed 15 mLs in the mouth or throat 2 (two) times daily. 03/18/21  Yes Dorie Rank, MD  CVS D3 25 MCG (1000 UT) capsule Take 1,000 Units by mouth daily. 05/21/18   [provider]  folic acid (FOLVITE) 1 MG tablet Take 1 mg by mouth daily. 05/21/18   [provider]  HYDROcodone-acetaminophen (NORCO/VICODIN) 5-325 MG tablet Take 1 tablet by mouth every 6 (six) hours as needed. 06/26/20   Couture, Cortni S, PA-C  tamsulosin (FLOMAX) 0.4 MG CAPS capsule Take 0.4 mg by mouth daily after breakfast.    Joline Salt, RN  thiamine 100 MG tablet Take 100 mg by mouth daily. 03/19/20   [provider]    Allergies    Patient has no known allergies.  Review of Systems   Review of Systems  All other systems reviewed and are negative.  Physical Exam Updated Vital Signs BP (!) 142/74 (BP Location: Right Arm)   Pulse 81   Temp 98.4 F (36.9 C) (Oral)   Resp 18   Ht 1.829 m (6')   Wt 65.8 kg   SpO2 99%   BMI 19.67 kg/m   Physical Exam Vitals and nursing note reviewed.  Constitutional:      Appearance: He is well-developed. He is not ill-appearing or diaphoretic.  HENT:     Head: Normocephalic.     Comments: Avulsion type laceration with a flap of tissue of his upper lip, the wound appears epithelialized, the tissue up flap appears edematous, poor dentition, no facial tenderness, no jaw malocclusion    Right Ear: External ear normal.     Left Ear: External ear normal.  Eyes:     General: No scleral icterus.       Right eye: No discharge.        Left eye: No discharge.      Conjunctiva/sclera: Conjunctivae normal.  Neck:     Trachea: No tracheal deviation.  Cardiovascular:     Rate and Rhythm: Normal rate.  Pulmonary:     Effort: Pulmonary effort is normal. No respiratory distress.     Breath sounds: No stridor.  Chest:     Comments: Mild tenderness palpation left chest wall Abdominal:     General: There is no distension.  Musculoskeletal:        General: No swelling or deformity.  Cervical back: Neck supple.  Skin:    General: Skin is warm and dry.     Findings: No rash.  Neurological:     Mental Status: He is alert.     Cranial Nerves: Cranial nerve deficit: no gross deficits.       ED Results / Procedures / Treatments   Labs (all labs ordered are listed, but only abnormal results are displayed) Labs Reviewed - No data to display  EKG None  Radiology DG Ribs Unilateral W/Chest Left  Result Date: 03/18/2021 CLINICAL DATA:  Fall with rib pain. EXAM: LEFT RIBS AND CHEST - 3+ VIEW COMPARISON:  Right rib x-ray 06/26/2020. FINDINGS: No fracture or other bone lesions are seen involving the ribs. There is no evidence of pneumothorax or pleural effusion. Both lungs are clear. Heart size and mediastinal contours are within normal limits. IMPRESSION: Negative. Electronically Signed   By: Ronney Asters M.D.   On: 03/18/2021 15:18    Procedures .Marland KitchenLaceration Repair  Date/Time: 03/18/2021 11:17 PM Performed by: Dorie Rank, MD Authorized by: Dorie Rank, MD   Consent:    Consent obtained:  Verbal   Consent given by:  Patient   Risks discussed:  Infection, need for additional repair, pain, poor cosmetic result and poor wound healing   Alternatives discussed:  No treatment and delayed treatment Universal protocol:    Procedure explained and questions answered to patient or proxy's satisfaction: yes     Relevant documents present and verified: yes     Test results available: yes     Imaging studies available: yes     Required blood products,  implants, devices, and special equipment available: yes     Site/side marked: yes     Immediately prior to procedure, a time out was called: yes     Patient identity confirmed:  Verbally with patient Anesthesia:    Anesthesia method:  Local infiltration   Local anesthetic:  Lidocaine 1% w/o epi Laceration details:    Location:  Lip   Lip location: upper lip.   Length (cm):  3   Depth (mm):  10 Pre-procedure details:    Preparation:  Patient was prepped and draped in usual sterile fashion Exploration:    Imaging outcome: foreign body not noted   Treatment:    Area cleansed with:  Saline   Amount of cleaning:  Standard   Irrigation solution:  Sterile saline   Irrigation method:  Syringe   Visualized foreign bodies/material removed: no     Debridement:  None Skin repair:    Repair method:  Sutures   Suture size:  6-0   Wound skin closure material used: vicryl rapid.   Number of sutures:  6 Approximation:    Approximation:  Loose   Vermilion border well-aligned: yes   Repair type:    Repair type:  Intermediate Post-procedure details:    Dressing:  Open (no dressing)   Procedure completion:  Tolerated well, no immediate complications Comments:     Flap re-approximated   Medications Ordered in ED Medications  acetaminophen (TYLENOL) tablet 650 mg (has no administration in time range)  lidocaine (PF) (XYLOCAINE) 1 % injection 5 mL (5 mLs Infiltration Given by Other 03/18/21 2204)  Tdap (BOOSTRIX) injection 0.5 mL (0.5 mLs Intramuscular Given 03/18/21 2204)    ED Course  I have reviewed the triage vital signs and the nursing notes.  Pertinent labs & imaging results that were available during my care of the patient were reviewed by me and  considered in my medical decision making (see chart for details).  Clinical Course as of 03/18/21 2320  Mon Mar 18, 2021  2156 Reivewed case with Dr Marla Roe.  Recommends placing a couple of sutures to help stabilize.  Ok to follow up in  office. [JK]    Clinical Course User Index [JK] Dorie Rank, MD   MDM Rules/Calculators/A&P                          Patient with a complex flap type lip laceration.  No findings to suggest facial bone fracture.  X-rays without rib fractures.  Wound already appears to be healing.  Discussed with plastic surgery and sutures were placed to reapproximate the flap.  We will have patient follow-up with plastic surgery.  I will prescribe antibiotics considering the laceration occurred over 24 hours ago. Final Clinical Impression(s) / ED Diagnoses Final diagnoses:  Lip laceration, initial encounter    Rx / DC Orders ED Discharge Orders          Ordered    amoxicillin-clavulanate (AUGMENTIN) 875-125 MG tablet  2 times daily        03/18/21 2314    chlorhexidine (PERIDEX) 0.12 % solution  2 times daily        03/18/21 2315             Dorie Rank, MD 03/18/21 2320

## 2021-04-16 ENCOUNTER — Encounter: Payer: Self-pay | Admitting: Plastic Surgery

## 2021-04-16 ENCOUNTER — Ambulatory Visit (INDEPENDENT_AMBULATORY_CARE_PROVIDER_SITE_OTHER): Payer: Medicare HMO | Admitting: Plastic Surgery

## 2021-04-16 ENCOUNTER — Other Ambulatory Visit: Payer: Self-pay

## 2021-04-16 DIAGNOSIS — S01511A Laceration without foreign body of lip, initial encounter: Secondary | ICD-10-CM | POA: Insufficient documentation

## 2021-04-16 NOTE — Progress Notes (Signed)
Patient ID: Alexander Schmitt, male    DOB: 14-Mar-1951, 70 y.o.   MRN: [id number]   Chief Complaint  Patient presents with   Advice Only    The patient is a 70 year old gentleman here for evaluation of his upper lip.  1 month ago the patient fell when he got dizzy and sustained a laceration to his upper lip.  It was evaluated in the emergency room but was several days late so it mostly healed by secondary intention.  There is no sign of infection.  There is a fullness in the upper right side.  It feels soft and bite is quite aggravating as it abuts the lower lip.  He would like to see if it could be improved upon.  I think this is reasonable.  His loss of several teeth as well   Review of Systems  Constitutional: Negative.   Eyes: Negative.   Respiratory: Negative.    Cardiovascular: Negative.   Gastrointestinal: Negative.   Endocrine: Negative.   Genitourinary: Negative.   Musculoskeletal: Negative.   Skin: Negative.   Hematological: Negative.   Psychiatric/Behavioral: Negative.     Past Medical History:  Diagnosis Date   Alcoholism ([place]) since before 2013   Anemia 08/2015   PRBC x 2 units 08/2015.    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   Upper GI bleed 08/2015   suspected gastric varices;     Past Surgical History:  Procedure Laterality Date   COLONOSCOPY N/A 09/07/2015   Procedure: COLONOSCOPY;  Surgeon: Irene Shipper, MD;  Location: [place];  Service: Endoscopy;  Laterality: N/A;   COLONOSCOPY      ESOPHAGOGASTRODUODENOSCOPY Left 09/02/2015   Procedure: ESOPHAGOGASTRODUODENOSCOPY (EGD);  Surgeon: Manus Gunning, MD;  Location: [place];  Service: Gastroenterology;  Laterality: Left;   EUS N/A 07/13/2013   Procedure: ESOPHAGEAL ENDOSCOPIC ULTRASOUND (EUS) RADIAL;  Surgeon: Arta Silence, MD;  [facility];  Service: Endoscopy;  Laterality: N/A;   FASCIECTOMY Right 03/01/2019   Procedure: SEGMENTAL  FASCIECTOMY RIGHT RING FINGER;  Surgeon: Daryll Brod, MD;  Location: [place];  Service: Orthopedics;  Laterality: Right;  ANESTHESIA   AXILLAR BLOCK   INGUINAL HERNIA REPAIR Right 09/29/2012   Procedure: HERNIA REPAIR INGUINAL ADULT;  Surgeon: Imogene Burn. Georgette Dover, MD;  [facility];  Service: General;  Laterality: Right;   INSERTION OF MESH Right 09/29/2012   Procedure: INSERTION OF MESH;  Surgeon: Imogene Burn. Georgette Dover, MD;  [facility];  Service: General;  Laterality: Right;   UMBILICAL HERNIA REPAIR N/A 09/29/2012   Procedure: HERNIA REPAIR UMBILICAL ADULT;  Surgeon: Imogene Burn. Tsuei, MD;  [facility];  Service: General;  Laterality: N/A;      Current Outpatient Medications:    CVS D3 25 MCG (1000 UT) capsule, Take 1,000 Units by mouth daily., Disp: , Rfl:    folic acid (FOLVITE) 1 MG tablet, Take 1 mg by mouth daily., Disp: , Rfl:    tamsulosin (  FLOMAX) 0.4 MG CAPS capsule, Take 0.4 mg by mouth daily after breakfast., Disp: , Rfl:    thiamine (VITAMIN B-1) 100 MG tablet, Take 100 mg by mouth daily., Disp: , Rfl:    Objective:   Vitals:   04/16/21 1357  BP: 127/75  Pulse: 89  SpO2: 99%    Physical Exam Vitals and nursing note reviewed.  Constitutional:      Appearance: Normal appearance.  HENT:     Head: Normocephalic.  Cardiovascular:     Rate and Rhythm: Normal rate.     Pulses: Normal pulses.  Pulmonary:     Effort: Pulmonary effort is normal.  Skin:    Coloration: Skin is not jaundiced.     Findings: No bruising.  Neurological:      Mental Status: He is alert and oriented to person, place, and time.  Psychiatric:        Mood and Affect: Mood normal.        Behavior: Behavior normal.        Thought Content: Thought content normal.    Assessment & Plan:  Lip laceration, initial encounter  I would like the patient to massage the area and then see him back in 1 month for reevaluation.  We will likely need to do excision and revision.  Patient acknowledges understanding the plan.  Pictures were obtained of the patient and placed in the chart with the patient's or guardian's permission.   [place], DO

## 2021-06-18 ENCOUNTER — Other Ambulatory Visit: Payer: Self-pay

## 2021-06-18 ENCOUNTER — Ambulatory Visit (INDEPENDENT_AMBULATORY_CARE_PROVIDER_SITE_OTHER): Payer: Medicare HMO | Admitting: Plastic Surgery

## 2021-06-18 ENCOUNTER — Encounter: Payer: Self-pay | Admitting: Plastic Surgery

## 2021-06-18 DIAGNOSIS — S01511A Laceration without foreign body of lip, initial encounter: Secondary | ICD-10-CM

## 2021-06-18 NOTE — Progress Notes (Signed)
° °  Subjective:    Patient ID: Alexander Schmitt, male    DOB: 1951/03/08, 70 y.o.   MRN: [id number]  The patient is a 70 year old male who had an accident and sustained a bad laceration to his upper lip September 2022.  The swelling and soft tissue bruising has markedly improved.  He still has a lot of fullness which is not likely to go away.  He states he is patient and would like to give a little more time to see if it improves.  There is no sign of infection and the skin is healed.     Review of Systems  Constitutional: Negative.   Eyes: Negative.   Respiratory: Negative.    Cardiovascular: Negative.   Gastrointestinal: Negative.   Endocrine: Negative.   Genitourinary: Negative.       Objective:   Physical Exam Constitutional:      Appearance: Normal appearance.  Cardiovascular:     Rate and Rhythm: Normal rate.     Pulses: Normal pulses.  Pulmonary:     Effort: Pulmonary effort is normal.  Skin:    Capillary Refill: Capillary refill takes less than 2 seconds.     Coloration: Skin is not jaundiced.     Findings: No bruising.  Neurological:     Mental Status: He is alert and oriented to person, place, and time.  Psychiatric:        Mood and Affect: Mood normal.        Behavior: Behavior normal.        Thought Content: Thought content normal.       Assessment & Plan:     ICD-10-CM   1. Lip laceration, initial encounter  J88.416S        Recommend excision and redo repair of upper lip laceration.  We will give it 3 more months before we do anything to see if any additional swelling resolves.  Patient is in agreement and we will see him back in March.

## 2021-09-10 ENCOUNTER — Other Ambulatory Visit: Payer: Self-pay

## 2021-09-10 ENCOUNTER — Encounter: Payer: Self-pay | Admitting: Plastic Surgery

## 2021-09-10 ENCOUNTER — Ambulatory Visit (INDEPENDENT_AMBULATORY_CARE_PROVIDER_SITE_OTHER): Payer: Medicare HMO | Admitting: Plastic Surgery

## 2021-09-10 DIAGNOSIS — S01511A Laceration without foreign body of lip, initial encounter: Secondary | ICD-10-CM | POA: Diagnosis not present

## 2021-09-10 NOTE — Progress Notes (Signed)
? ?  Patient ID: Alexander Schmitt, male    DOB: 1951-05-13, 71 y.o.   MRN: [id number] ? ? ?Chief Complaint  ?Patient presents with  ? Follow-up  ? Skin Problem  ? ? ?The patient is a 71 year old gentleman here for follow-up on his lip laceration.  He fell 3 months ago after getting dizzy.  He had a severe laceration to his upper lip.  This has left him with asymmetry and bulkiness on the right side.  As a result he has a gap on the left upper lip.  He finds that he accidentally bites this at times. ? ? ?Review of Systems  ?Constitutional: Negative.   ?Eyes: Negative.   ?Respiratory: Negative.    ?Cardiovascular: Negative.   ?Gastrointestinal: Negative.   ?Endocrine: Negative.   ?Genitourinary: Negative.   ?Musculoskeletal: Negative.   ?Skin: Negative.   ?Neurological: Negative.   ?Hematological: Negative.   ?Psychiatric/Behavioral: Negative.    ? ?Past Medical History:  ?Diagnosis Date  ? Alcoholism ([place]) since before 2013  ? Anemia 08/2015  ? PRBC x 2 units 08/2015.   ? Arthritis   ? left shoulder   ? Ascites 11/2005, 05/2012  ? 05/2012:4 liter paracentesis, no SBP   ? Cirrhosis ([place]) 2013  ? Colon polyp 09/07/15  ? ascenging colon, minor/scattered diverticulosis on colonoscopy  ? GERD (gastroesophageal reflux disease)   ? occasional   ? History of stomach ulcers   ? Hypertension   ? Pancreatitis 08/5425  ? acute alcoholic and chronic; pseudocyst and pancreatic calcifications 11/2011  ? SBO (small bowel obstruction) ([place]) 11/2005  ? medical mgt  ? 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 Upper GI bleed 08/2015  ? suspected gastric varices;   ?  ?Past Surgical History:  ?Procedure Laterality Date  ? COLONOSCOPY N/A 09/07/2015  ? Procedure: COLONOSCOPY;  Surgeon: Irene Shipper, MD;  Location: [facility] ENDOSCOPY;  Service: Endoscopy;  Laterality: N/A;  ? COLONOSCOPY    ? ESOPHAGOGASTRODUODENOSCOPY Left 09/02/2015  ? Procedure: ESOPHAGOGASTRODUODENOSCOPY (EGD);  Surgeon: Manus Gunning, MD;  Location: [place];  Service:  Gastroenterology;  Laterality: Left;  ? EUS N/A 07/13/2013  ? Procedure: ESOPHAGEAL ENDOSCOPIC ULTRASOUND (EUS) RADIAL;  Surgeon: Arta Silence, MD;  [facility];  Service: Endoscopy;  Laterality: N/A;  ? FASCIECTOMY Right 03/01/2019  ? Procedure: SEGMENTAL  FASCIECTOMY RIGHT RING FINGER;  Surgeon: Daryll Brod, MD;  Location: [place];  Service: Orthopedics;  Laterality: Right;  ANESTHESIA   AXILLAR BLOCK  ? INGUINAL HERNIA REPAIR Right 09/29/2012  ? Procedure: HERNIA REPAIR INGUINAL ADULT;  Surgeon: Imogene Burn. Georgette Dover, MD;  [facility];  Service: General;  Laterality: Right;  ? INSERTION OF MESH Right 09/29/2012  ? Procedure: INSERTION OF MESH;  Surgeon: Imogene Burn. Georgette Dover, MD;  [facility];  Service: General;  Laterality: Right;  ? UMBILICAL HERNIA REPAIR N/A 09/29/2012  ? Procedure: HERNIA REPAIR UMBILICAL ADULT;  Surgeon: Imogene Burn. Georgette Dover, MD;  [facility];  Service: General;  Laterality: N/A;  ?  ? ? ?Current Outpatient Medications:  ?  CVS D3 25 MCG (1000 UT) capsule, Take 1,000 Units by mouth daily., Disp: , Rfl:  ?  folic acid (FOLVITE) 1 MG tablet, Take 1 mg by mouth daily., Disp: , Rfl:  ?  tamsulosin (FLOMAX) 0.4 MG CAPS capsule, Take 0.4 mg by mouth daily after breakfast., Disp: , Rfl:  ?  thiamine (VITAMIN B-1) 100 MG tablet, Take 100 mg by mouth daily., Disp: , Rfl:   ? ?  Objective:  ? ?There were no vitals filed for this visit. ? ?Physical Exam ?Constitutional:   ?   Appearance: Normal appearance.  ?Cardiovascular:  ?   Rate and Rhythm: Normal rate.  ?   Pulses: Normal pulses.  ?Pulmonary:  ?   Effort: Pulmonary effort is normal.  ?Skin: ?   Capillary Refill: Capillary refill takes less than 2 seconds.  ?Neurological:  ?   Mental Status: He is alert.  ?Psychiatric:     ?   Mood and Affect: Mood normal.     ?   Behavior: Behavior normal.     ?   Thought Content: Thought content normal.     ?   Judgment: Judgment normal.  ? ? ?Assessment & Plan:  ?Lip laceration, initial  encounter ? ?Recommend repair of upper lip laceration with severe asymmetry.  We can do this at [facility] day.  Plan on seeing him 1 to 2 weeks after follow-up.  This can count has is H&P. ? ?Alexander Lofty Shareka Casale, DO ?

## 2021-09-10 NOTE — H&P (View-Only) (Signed)
? ?  Patient ID: Alexander Schmitt, male    DOB: [birth year], 71 y.o.   MRN: [id number] ? ? ?Chief Complaint  ?Patient presents with  ? Follow-up  ? Skin Problem  ? ? ?The patient is a 71 year old gentleman here for follow-up on his lip laceration.  He fell 3 months ago after getting dizzy.  He had a severe laceration to his upper lip.  This has left him with asymmetry and bulkiness on the right side.  As a result he has a gap on the left upper lip.  He finds that he accidentally bites this at times. ? ? ?Review of Systems  ?Constitutional: Negative.   ?Eyes: Negative.   ?Respiratory: Negative.    ?Cardiovascular: Negative.   ?Gastrointestinal: Negative.   ?Endocrine: Negative.   ?Genitourinary: Negative.   ?Musculoskeletal: Negative.   ?Skin: Negative.   ?Neurological: Negative.   ?Hematological: Negative.   ?Psychiatric/Behavioral: Negative.    ? ?Past Medical History:  ?Diagnosis Date  ? Alcoholism ([place]) since before 2013  ? Anemia 08/2015  ? PRBC x 2 units 08/2015.   ? Arthritis   ? left shoulder   ? Ascites 11/2005, 05/2012  ? 05/2012:4 liter paracentesis, no SBP   ? Cirrhosis ([place]) 2013  ? Colon polyp 09/07/15  ? ascenging colon, minor/scattered diverticulosis on colonoscopy  ? GERD (gastroesophageal reflux disease)   ? occasional   ? History of stomach ulcers   ? Hypertension   ? Pancreatitis 01/5642  ? acute alcoholic and chronic; pseudocyst and pancreatic calcifications 11/2011  ? SBO (small bowel obstruction) ([place]) 11/2005  ? medical mgt  ? 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 Upper GI bleed 08/2015  ? suspected gastric varices;   ?  ?Past Surgical History:  ?Procedure Laterality Date  ? COLONOSCOPY N/A 09/07/2015  ? Procedure: COLONOSCOPY;  Surgeon: Irene Shipper, MD;  [facility];  Service: Endoscopy;  Laterality: N/A;  ? COLONOSCOPY    ? ESOPHAGOGASTRODUODENOSCOPY Left 09/02/2015  ? Procedure: ESOPHAGOGASTRODUODENOSCOPY (EGD);  Surgeon: Manus Gunning, MD;  Location: [place];  Service:  Gastroenterology;  Laterality: Left;  ? EUS N/A 07/13/2013  ? Procedure: ESOPHAGEAL ENDOSCOPIC ULTRASOUND (EUS) RADIAL;  Surgeon: Arta Silence, MD;  [facility];  Service: Endoscopy;  Laterality: N/A;  ? FASCIECTOMY Right 03/01/2019  ? Procedure: SEGMENTAL  FASCIECTOMY RIGHT RING FINGER;  Surgeon: Daryll Brod, MD;  Location: [place];  Service: Orthopedics;  Laterality: Right;  ANESTHESIA   AXILLAR BLOCK  ? INGUINAL HERNIA REPAIR Right 09/29/2012  ? Procedure: HERNIA REPAIR INGUINAL ADULT;  Surgeon: Imogene Burn. Georgette Dover, MD;  [facility];  Service: General;  Laterality: Right;  ? INSERTION OF MESH Right 09/29/2012  ? Procedure: INSERTION OF MESH;  Surgeon: Imogene Burn. Georgette Dover, MD;  [facility];  Service: General;  Laterality: Right;  ? UMBILICAL HERNIA REPAIR N/A 09/29/2012  ? Procedure: HERNIA REPAIR UMBILICAL ADULT;  Surgeon: Imogene Burn. Georgette Dover, MD;  [facility];  Service: General;  Laterality: N/A;  ?  ? ? ?Current Outpatient Medications:  ?  CVS D3 25 MCG (1000 UT) capsule, Take 1,000 Units by mouth daily., Disp: , Rfl:  ?  folic acid (FOLVITE) 1 MG tablet, Take 1 mg by mouth daily., Disp: , Rfl:  ?  tamsulosin (FLOMAX) 0.4 MG CAPS capsule, Take 0.4 mg by mouth daily after breakfast., Disp: , Rfl:  ?  thiamine (VITAMIN B-1) 100 MG tablet, Take 100 mg by mouth daily., Disp: , Rfl:   ? ?  Objective:  ? ?There were no vitals filed for this visit. ? ?Physical Exam ?Constitutional:   ?   Appearance: Normal appearance.  ?Cardiovascular:  ?   Rate and Rhythm: Normal rate.  ?   Pulses: Normal pulses.  ?Pulmonary:  ?   Effort: Pulmonary effort is normal.  ?Skin: ?   Capillary Refill: Capillary refill takes less than 2 seconds.  ?Neurological:  ?   Mental Status: He is alert.  ?Psychiatric:     ?   Mood and Affect: Mood normal.     ?   Behavior: Behavior normal.     ?   Thought Content: Thought content normal.     ?   Judgment: Judgment normal.  ? ? ?Assessment & Plan:  ?Lip laceration, initial  encounter ? ?Recommend repair of upper lip laceration with severe asymmetry.  We can do this at [facility] day.  Plan on seeing him 1 to 2 weeks after follow-up.  This can count has is H&P. ? ?Loel Lofty Satoya Feeley, DO ?

## 2021-09-12 ENCOUNTER — Telehealth: Payer: Self-pay | Admitting: Plastic Surgery

## 2021-09-12 NOTE — Telephone Encounter (Signed)
Attempted to call patient to advise his surgery has been scheduled for 09/26/2021 at 7:30 am at [facility]. Patient should arrive 2 hours prior to that time. His post op appt is 3/31 in our office. Unable to leave voicemail so sent SMS message for patient to call our office back.  ?

## 2021-09-12 NOTE — Telephone Encounter (Signed)
Vaughan Basta called to say she missed a call from [place].  I searched by her phone number and found a message from Broadus John to [employer].  Vaughan Basta let me speak with Jori Moll and I relayed Stephanie's message to him. ?

## 2021-09-17 ENCOUNTER — Telehealth: Payer: Self-pay | Admitting: Plastic Surgery

## 2021-09-17 NOTE — Telephone Encounter (Signed)
Tried to call patient to advise of surgery twice, but vm is full. Sent SMS message to advise of surgery 09/26/2021 at 7:30 am. Will try significant other as well.  ?

## 2021-09-20 ENCOUNTER — Telehealth: Payer: Self-pay

## 2021-09-20 NOTE — Telephone Encounter (Signed)
Called patient, left message on VM. Surgery is scheduled on Thursday 09/26/2021 at [facility] with a arrival time of 5:30, and start time of 7:30. NPO after midnight. Call back if any further questions ?

## 2021-09-24 ENCOUNTER — Encounter (HOSPITAL_COMMUNITY): Payer: Self-pay | Admitting: Plastic Surgery

## 2021-09-24 ENCOUNTER — Other Ambulatory Visit: Payer: Self-pay

## 2021-09-24 NOTE — Progress Notes (Signed)
PCP - Dr. Quentin Cornwall  ?Cardiologist -  ?EKG - DOS ?Chest x-ray -  ?ECHO -  ?Cardiac Cath -  ?CPAP -  ? ?ERAS Protcol - n/a ?COVID TEST- n/a ? ?Anesthesia review: yes ? ?------------- ? ?SDW INSTRUCTIONS: ? ?Your procedure is scheduled on Thursday 3/23. Please report to [facility] Main Entrance "A" at 0530 A.M., and check in at the Admitting office. Call this number if you have problems the morning of surgery: [phone] ? ? ?Remember: Do not eat or drink after midnight the night before your surgery ?Medications to take morning of surgery with a sip of water include: ?NONE ? ?As of today, STOP taking any Aspirin (unless otherwise instructed by your surgeon), Aleve, Naproxen, Ibuprofen, Motrin, Advil, Goody's, BC's, all herbal medications, fish oil, and all vitamins. ? ?  ?The Morning of Surgery ?Do not wear jewelry ?Do not wear lotions, powders, or colognes, or deodorant ?Do not bring valuables to the hospital. ?[facility] is not responsible for any belongings or valuables. ? ?If you are a smoker, DO NOT Smoke 24 hours prior to surgery ? ?If you wear a CPAP at night please bring your mask the morning of surgery  ? ?Remember that you must have someone to transport you home after your surgery, and remain with you for 24 hours if you are discharged the same day. ? ?Please bring cases for contacts, glasses, hearing aids, dentures or bridgework because it cannot be worn into surgery.  ? ?Patients discharged the day of surgery will not be allowed to drive home.  ? ?Please shower the NIGHT BEFORE/MORNING OF SURGERY (use antibacterial soap like DIAL soap if possible). Wear comfortable clothes the morning of surgery. Oral Hygiene is also important to reduce your risk of infection.  Remember - BRUSH YOUR TEETH THE MORNING OF SURGERY WITH YOUR REGULAR TOOTHPASTE ? ?Patient denies shortness of breath, fever, cough and chest pain.  ? ? ?   ? ?

## 2021-09-25 NOTE — Progress Notes (Signed)
Anesthesia Chart Review: SAME DAY WORK-UP ? Case: [id number] Date/Time: 09/26/21 0715  ? Procedure: LESION EXCISION WITH COMPLEX REPAIR (Mouth)  ? Anesthesia type: General  ? Pre-op diagnosis: Lip Laceration  ? Location: [facility] OR ROOM 08 / [place] OR  ? Surgeons: Wallace Going, DO  ? ?  ? ? ?DISCUSSION: Patient is a 71 year old male scheduled for the above procedure.  He fell in September 2022 and sustained a severe laceration to his upper lip.  This is left him with asymmetry and bulkiness on the right side with a gap on the left upper lip. The above surgery was recommended. ? 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  Alcohol use is documented as "Not Currently". ? ?He is a same-day work-up.  Currently I do not have recent labs or EKG. His PCP is "Dr. Quentin Cornwall" at [facility]. Will attempt to get last office notes, EKG, labs reports. Thrombocytopenia was only mild (135K) as of 08/15/18. Labs and EKG on arrival as indicated.  Anesthesia team to evaluate on the day of surgery. ? ? ?VS: Ht 6' (1.829 m)   Wt 70.8 kg   BMI 21.16 kg/m? 04/16/21: BP 127/75, HR 89 ? ? ?PROVIDERS: ?Inc, [facility] - Dr. Quentin Cornwall ? ? ?LABS: For day of surgery as indicated. See DISCUSSION. ? ? ?IMAGES: ?Left ribs and chest 3V xray 03/18/21: ?FINDINGS: ?No fracture or other bone lesions are seen involving the ribs. There ?is no evidence of pneumothorax or pleural effusion. Both lungs are ?clear. Heart size and mediastinal contours are within normal limits. ?IMPRESSION: ?Negative. ?  ?MRI Liver 11/07/15: ?IMPRESSION: ?1. Mildly motion degraded exam. ?2. Findings of chronic calcific pancreatitis with main and side ?branch duct dilatation and intraductal  calculi. ?3. No acute superimposed pancreatitis. Resolution of complex and ?simple peripancreatic fluid collections. ?4. Chronic splenic vein insufficiency/thrombosis. ? ? ?EKG: Last EKG > 1 year. Per 08/14/18 tracing:  ?Sinus rhythm, probable anteroseptal infarct (old). ? ? ?CV: N/A ? ?Past Medical History:  ?Diagnosis Date  ? Alcoholism ([place]) since before 2013  ? Anemia 08/2015  ? PRBC x 2 units 08/2015.   ? Arthritis   ? left shoulder   ? Ascites 11/2005, 05/2012  ? 05/2012:4 liter paracentesis, no SBP   ? Cirrhosis ([place]) 2013  ? Colon polyp 09/07/15  ? ascenging colon, minor/scattered diverticulosis on colonoscopy  ? GERD (gastroesophageal reflux disease)   ? occasional   ? History of stomach ulcers   ? Hypertension   ? Pancreatitis 10/5623  ? acute alcoholic and chronic; pseudocyst and pancreatic calcifications 11/2011  ? SBO (small bowel obstruction) ([place]) 11/2005  ? medical mgt  ? 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 Upper GI bleed 08/2015  ? suspected gastric varices;   ? ? ?Past Surgical History:  ?Procedure Laterality Date  ? COLONOSCOPY N/A 09/07/2015  ? Procedure: COLONOSCOPY;  Surgeon: Irene Shipper, MD;  Location: [facility] ENDOSCOPY;  Service: Endoscopy;  Laterality: N/A;  ? COLONOSCOPY    ? ESOPHAGOGASTRODUODENOSCOPY Left 09/02/2015  ? Procedure: ESOPHAGOGASTRODUODENOSCOPY (EGD);  Surgeon: Manus Gunning, MD;  Location: [place];  Service: Gastroenterology;  Laterality: Left;  ? EUS N/A 07/13/2013  ? Procedure: ESOPHAGEAL ENDOSCOPIC ULTRASOUND (EUS) RADIAL;  Surgeon: Arta Silence, MD;  [facility];  Service: Endoscopy;  Laterality:  N/A;  ? FASCIECTOMY Right 03/01/2019  ? Procedure: SEGMENTAL  FASCIECTOMY RIGHT RING FINGER;  Surgeon: Daryll Brod, MD;  Location: [place];  Service: Orthopedics;  Laterality: Right;  ANESTHESIA   AXILLAR BLOCK  ? INGUINAL HERNIA REPAIR Right 09/29/2012  ? Procedure: HERNIA REPAIR INGUINAL ADULT;  Surgeon: Imogene Burn. Georgette Dover, MD;  [facility];  Service:  General;  Laterality: Right;  ? INSERTION OF MESH Right 09/29/2012  ? Procedure: INSERTION OF MESH;  Surgeon: Imogene Burn. Georgette Dover, MD;  [facility];  Service: General;  Laterality: Right;  ? UMBILICAL HERNIA REPAIR N/A 09/29/2012  ? Procedure: HERNIA REPAIR UMBILICAL ADULT;  Surgeon: Imogene Burn. Georgette Dover, MD;  [facility];  Service: General;  Laterality: N/A;  ? ? ?MEDICATIONS: ?No current facility-administered medications for this encounter.  ? ? CVS D3 25 MCG (1000 UT) capsule  ? folic acid (FOLVITE) 1 MG tablet  ? tamsulosin (FLOMAX) 0.4 MG CAPS capsule  ? thiamine (VITAMIN B-1) 100 MG tablet  ? ? ?Myra Gianotti, PA-C ?Surgical Short Stay/Anesthesiology ?[facility] Phone [phone] ?[facility] Phone [phone] ?09/25/2021 1:50 PM ? ? ? ? ? ? ? ?

## 2021-09-25 NOTE — Anesthesia Preprocedure Evaluation (Addendum)
Anesthesia Evaluation  ?Patient identified by MRN, date of birth, ID band ?Patient awake ? ? ? ?Reviewed: ?Allergy & Precautions, NPO status , Patient's Chart, lab work & pertinent test results ? ?Airway ?Mallampati: II ? ?TM Distance: >3 FB ?Neck ROM: Full ? ? ? Dental ? ?(+) Poor Dentition ?Multiple missing and decayed teeth:   ?Pulmonary ?neg pulmonary ROS, Current Smoker and Patient abstained from smoking.,  ?  ?Pulmonary exam normal ?breath sounds clear to auscultation ? ? ? ? ? ? Cardiovascular ?Exercise Tolerance: Good ?hypertension, negative cardio ROS ?Normal cardiovascular exam ?Rhythm:Regular Rate:Normal ? ? ?  ?Neuro/Psych ?Hard of hearing ?negative psych ROS  ? GI/Hepatic ?Neg liver ROS, GERD  ,H/o alcholic pancreatitis ?ascites ?  ?Endo/Other  ?negative endocrine ROS ? Renal/GU ?negative Renal ROS  ?negative genitourinary ?  ?Musculoskeletal ? ?(+) Arthritis ,  ? Abdominal ?  ?Peds ?negative pediatric ROS ?(+)  Hematology ? ?(+) Blood dyscrasia, anemia ,   ?Anesthesia Other Findings ? ? Reproductive/Obstetrics ?negative OB ROS ? ?  ? ? ? ? ? ? ? ? ? ? ? ? ? ?  ?  ? ? ? ? ? ? ? ?Anesthesia Physical ?Anesthesia Plan ? ?ASA: 3 ? ?Anesthesia Plan: General  ? ?Post-op Pain Management:   ? ?Induction: Intravenous ? ?PONV Risk Score and Plan: 1 and Treatment may vary due to age or medical condition, Midazolam, Ondansetron and Dexamethasone ? ?Airway Management Planned: Oral ETT ? ?Additional Equipment:  ? ?Intra-op Plan:  ? ?Post-operative Plan: Extubation in OR ? ?Informed Consent: I have reviewed the patients History and Physical, chart, labs and discussed the procedure including the risks, benefits and alternatives for the proposed anesthesia with the patient or authorized representative who has indicated his/her understanding and acceptance.  ? ? ? ?Dental advisory given ? ?Plan Discussed with: CRNA, Anesthesiologist and Surgeon ? ?Anesthesia Plan Comments: (PAT note  written 09/25/2021 by Myra Gianotti, PA-C. SAME DAY WORK-UP ? ? ?)  ? ? ? ? ? ?Anesthesia Quick Evaluation ? ?

## 2021-09-26 ENCOUNTER — Other Ambulatory Visit: Payer: Self-pay

## 2021-09-26 ENCOUNTER — Ambulatory Visit (HOSPITAL_BASED_OUTPATIENT_CLINIC_OR_DEPARTMENT_OTHER): Payer: Medicare HMO | Admitting: Vascular Surgery

## 2021-09-26 ENCOUNTER — Ambulatory Visit (HOSPITAL_COMMUNITY)
Admission: RE | Admit: 2021-09-26 | Discharge: 2021-09-26 | Disposition: A | Discharge status: Home / Self Care | Payer: Medicare HMO | Attending: Plastic Surgery | Admitting: Plastic Surgery

## 2021-09-26 ENCOUNTER — Encounter (HOSPITAL_COMMUNITY): Payer: Self-pay | Admitting: Plastic Surgery

## 2021-09-26 ENCOUNTER — Encounter (HOSPITAL_COMMUNITY)
Admission: RE | Disposition: A | Discharge status: Home / Self Care | Payer: Self-pay | Source: Home / Self Care | Attending: Plastic Surgery

## 2021-09-26 ENCOUNTER — Ambulatory Visit (HOSPITAL_COMMUNITY): Payer: Medicare HMO | Admitting: Vascular Surgery

## 2021-09-26 DIAGNOSIS — S01511A Laceration without foreign body of lip, initial encounter: Secondary | ICD-10-CM

## 2021-09-26 DIAGNOSIS — K7460 Unspecified cirrhosis of liver: Secondary | ICD-10-CM | POA: Diagnosis not present

## 2021-09-26 DIAGNOSIS — I82891 Chronic embolism and thrombosis of other specified veins: Secondary | ICD-10-CM | POA: Diagnosis not present

## 2021-09-26 DIAGNOSIS — I10 Essential (primary) hypertension: Secondary | ICD-10-CM

## 2021-09-26 DIAGNOSIS — M1990 Unspecified osteoarthritis, unspecified site: Secondary | ICD-10-CM | POA: Diagnosis not present

## 2021-09-26 DIAGNOSIS — K861 Other chronic pancreatitis: Secondary | ICD-10-CM | POA: Insufficient documentation

## 2021-09-26 DIAGNOSIS — D649 Anemia, unspecified: Secondary | ICD-10-CM | POA: Diagnosis not present

## 2021-09-26 DIAGNOSIS — S01511D Laceration without foreign body of lip, subsequent encounter: Secondary | ICD-10-CM | POA: Diagnosis not present

## 2021-09-26 DIAGNOSIS — W19XXXA Unspecified fall, initial encounter: Secondary | ICD-10-CM | POA: Diagnosis not present

## 2021-09-26 DIAGNOSIS — D62 Acute posthemorrhagic anemia: Secondary | ICD-10-CM

## 2021-09-26 DIAGNOSIS — K852 Alcohol induced acute pancreatitis without necrosis or infection: Secondary | ICD-10-CM

## 2021-09-26 HISTORY — PX: LESION EXCISION WITH COMPLEX REPAIR: SHX6700

## 2021-09-26 LAB — BASIC METABOLIC PANEL
Anion gap: 6 (ref 5–15)
BUN: 7 mg/dL — ABNORMAL LOW (ref 8–23)
CO2: 25 mmol/L (ref 22–32)
Calcium: 8.6 mg/dL — ABNORMAL LOW (ref 8.9–10.3)
Chloride: 104 mmol/L (ref 98–111)
Creatinine, Ser: 0.95 mg/dL (ref 0.61–1.24)
GFR, Estimated: >60 mL/min (ref >60)
Glucose, Bld: 100 mg/dL — ABNORMAL HIGH (ref 70–99)
Potassium: 3.5 mmol/L (ref 3.5–5.1)
Sodium: 135 mmol/L (ref 135–145)

## 2021-09-26 LAB — CBC
HCT: 27.4 % — ABNORMAL LOW (ref 39.0–52.0)
Hemoglobin: 9 g/dL — ABNORMAL LOW (ref 13.0–17.0)
MCH: 33.8 pg (ref 26.0–34.0)
MCHC: 32.8 g/dL (ref 30.0–36.0)
MCV: 103 fL — ABNORMAL HIGH (ref 80.0–100.0)
Platelets: 104 10*3/uL — ABNORMAL LOW (ref 150–400)
RBC: 2.66 MIL/uL — ABNORMAL LOW (ref 4.22–5.81)
RDW: 13.6 % (ref 11.5–15.5)
WBC: 5.4 10*3/uL (ref 4.0–10.5)
nRBC: 0 % (ref 0.0–0.2)

## 2021-09-26 SURGERY — LESION EXCISION WITH COMPLEX REPAIR
Anesthesia: General | Site: Mouth

## 2021-09-26 MED ORDER — PROPOFOL 10 MG/ML IV BOLUS
INTRAVENOUS | Status: DC | PRN
  Administered 2021-09-26: 150 mg via INTRAVENOUS

## 2021-09-26 MED ORDER — OXYCODONE HCL 5 MG/5ML PO SOLN: 5.0000 mg | Freq: Once | ORAL | Status: DC | PRN

## 2021-09-26 MED ORDER — AMISULPRIDE (ANTIEMETIC) 5 MG/2ML IV SOLN: 10.0000 mg | Freq: Once | INTRAVENOUS | Status: DC | PRN

## 2021-09-26 MED ORDER — MIDAZOLAM HCL 2 MG/2ML IJ SOLN
INTRAMUSCULAR | Status: AC
  Filled 2021-09-26: qty 2

## 2021-09-26 MED ORDER — 0.9 % SODIUM CHLORIDE (POUR BTL) OPTIME
TOPICAL | Status: DC | PRN
  Administered 2021-09-26: 1000 mL

## 2021-09-26 MED ORDER — OXYCODONE HCL 5 MG PO TABS: 5.0000 mg | ORAL_TABLET | Freq: Once | ORAL | Status: DC | PRN

## 2021-09-26 MED ORDER — DEXAMETHASONE SODIUM PHOSPHATE 10 MG/ML IJ SOLN
INTRAMUSCULAR | Status: AC
  Filled 2021-09-26: qty 1

## 2021-09-26 MED ORDER — BACITRACIN ZINC 500 UNIT/GM EX OINT
TOPICAL_OINTMENT | CUTANEOUS | Status: DC | PRN
  Administered 2021-09-26: 1 via TOPICAL

## 2021-09-26 MED ORDER — GLYCOPYRROLATE 0.2 MG/ML IJ SOLN
INTRAMUSCULAR | Status: DC | PRN
  Administered 2021-09-26: .2 mg via INTRAVENOUS

## 2021-09-26 MED ORDER — FENTANYL CITRATE (PF) 100 MCG/2ML IJ SOLN: 25.0000 ug | INTRAMUSCULAR | Status: DC | PRN

## 2021-09-26 MED ORDER — ROCURONIUM BROMIDE 10 MG/ML (PF) SYRINGE
PREFILLED_SYRINGE | INTRAVENOUS | Status: AC
  Filled 2021-09-26: qty 10

## 2021-09-26 MED ORDER — LACTATED RINGERS IV SOLN: INTRAVENOUS | Status: DC

## 2021-09-26 MED ORDER — FENTANYL CITRATE (PF) 250 MCG/5ML IJ SOLN
INTRAMUSCULAR | Status: AC
  Filled 2021-09-26: qty 5

## 2021-09-26 MED ORDER — PROPOFOL 10 MG/ML IV BOLUS
INTRAVENOUS | Status: AC
  Filled 2021-09-26: qty 20

## 2021-09-26 MED ORDER — ROCURONIUM 10MG/ML (10ML) SYRINGE FOR MEDFUSION PUMP - OPTIME
INTRAVENOUS | Status: DC | PRN
  Administered 2021-09-26: 50 mg via INTRAVENOUS

## 2021-09-26 MED ORDER — ACETAMINOPHEN 325 MG PO TABS: 650.0000 mg | ORAL_TABLET | ORAL | Status: DC | PRN

## 2021-09-26 MED ORDER — LIDOCAINE-EPINEPHRINE 1 %-1:100000 IJ SOLN
INTRAMUSCULAR | Status: AC
  Filled 2021-09-26: qty 1

## 2021-09-26 MED ORDER — ONDANSETRON HCL 4 MG/2ML IJ SOLN
INTRAMUSCULAR | Status: DC | PRN
  Administered 2021-09-26: 4 mg via INTRAVENOUS

## 2021-09-26 MED ORDER — DEXAMETHASONE SODIUM PHOSPHATE 10 MG/ML IJ SOLN
INTRAMUSCULAR | Status: DC | PRN
  Administered 2021-09-26: 10 mg via INTRAVENOUS

## 2021-09-26 MED ORDER — ONDANSETRON HCL 4 MG/2ML IJ SOLN
INTRAMUSCULAR | Status: AC
  Filled 2021-09-26: qty 2

## 2021-09-26 MED ORDER — CHLORHEXIDINE GLUCONATE CLOTH 2 % EX PADS: 6.0000 | MEDICATED_PAD | Freq: Once | CUTANEOUS | Status: DC

## 2021-09-26 MED ORDER — ONDANSETRON HCL 4 MG/2ML IJ SOLN: 4.0000 mg | Freq: Once | INTRAMUSCULAR | Status: DC | PRN

## 2021-09-26 MED ORDER — SODIUM CHLORIDE 0.9% FLUSH: 3.0000 mL | Freq: Two times a day (BID) | INTRAVENOUS | Status: DC

## 2021-09-26 MED ORDER — BUPIVACAINE-EPINEPHRINE (PF) 0.25% -1:200000 IJ SOLN
INTRAMUSCULAR | Status: AC
  Filled 2021-09-26: qty 30

## 2021-09-26 MED ORDER — OXYCODONE HCL 5 MG PO TABS: 5.0000 mg | ORAL_TABLET | ORAL | Status: DC | PRN

## 2021-09-26 MED ORDER — SUGAMMADEX SODIUM 200 MG/2ML IV SOLN
INTRAVENOUS | Status: DC | PRN
  Administered 2021-09-26: 200 mg via INTRAVENOUS

## 2021-09-26 MED ORDER — ACETAMINOPHEN 650 MG RE SUPP
650.0000 mg | RECTAL | Status: DC | PRN
Start: 2021-09-26 — End: 2021-09-26

## 2021-09-26 MED ORDER — FENTANYL CITRATE (PF) 250 MCG/5ML IJ SOLN
INTRAMUSCULAR | Status: DC | PRN
  Administered 2021-09-26 (×2): 50 ug via INTRAVENOUS

## 2021-09-26 MED ORDER — GLYCOPYRROLATE PF 0.2 MG/ML IJ SOSY
PREFILLED_SYRINGE | INTRAMUSCULAR | Status: AC
  Filled 2021-09-26: qty 1

## 2021-09-26 MED ORDER — BACITRACIN ZINC 500 UNIT/GM EX OINT
TOPICAL_OINTMENT | CUTANEOUS | Status: AC
  Filled 2021-09-26: qty 28.35

## 2021-09-26 MED ORDER — SODIUM CHLORIDE 0.9% FLUSH: 3.0000 mL | INTRAVENOUS | Status: DC | PRN

## 2021-09-26 MED ORDER — LACTATED RINGERS IV SOLN
INTRAVENOUS | Status: DC | PRN
Start: 2021-09-26 — End: 2021-09-26

## 2021-09-26 MED ORDER — SODIUM CHLORIDE 0.9 % IV SOLN: 250.0000 mL | INTRAVENOUS | Status: DC | PRN

## 2021-09-26 MED ORDER — PHENYLEPHRINE HCL-NACL 20-0.9 MG/250ML-% IV SOLN
INTRAVENOUS | Status: DC | PRN
Start: 2021-09-26 — End: 2021-09-26
  Administered 2021-09-26: 25 ug/min via INTRAVENOUS

## 2021-09-26 MED ORDER — LIDOCAINE HCL (CARDIAC) PF 100 MG/5ML IV SOSY
PREFILLED_SYRINGE | INTRAVENOUS | Status: DC | PRN
  Administered 2021-09-26: 100 mg via INTRAVENOUS

## 2021-09-26 MED ORDER — CHLORHEXIDINE GLUCONATE 0.12 % MT SOLN
15.0000 mL | Freq: Once | OROMUCOSAL | Status: AC
  Administered 2021-09-26: 15 mL via OROMUCOSAL
  Filled 2021-09-26: qty 15

## 2021-09-26 MED ORDER — CEFAZOLIN SODIUM-DEXTROSE 2-4 GM/100ML-% IV SOLN
2.0000 g | INTRAVENOUS | Status: AC
  Administered 2021-09-26: 2 g via INTRAVENOUS
  Filled 2021-09-26: qty 100

## 2021-09-26 MED ORDER — MIDAZOLAM HCL 2 MG/2ML IJ SOLN
INTRAMUSCULAR | Status: DC | PRN
  Administered 2021-09-26: 2 mg via INTRAVENOUS

## 2021-09-26 MED ORDER — LIDOCAINE 2% (20 MG/ML) 5 ML SYRINGE
INTRAMUSCULAR | Status: AC
  Filled 2021-09-26: qty 5

## 2021-09-26 MED ORDER — LIDOCAINE-EPINEPHRINE 1 %-1:100000 IJ SOLN
INTRAMUSCULAR | Status: DC | PRN
  Administered 2021-09-26: 2.5 mL

## 2021-09-26 MED ORDER — PHENYLEPHRINE HCL (PRESSORS) 10 MG/ML IV SOLN
INTRAVENOUS | Status: DC | PRN
  Administered 2021-09-26: 80 ug via INTRAVENOUS

## 2021-09-26 MED ORDER — ORAL CARE MOUTH RINSE: 15.0000 mL | Freq: Once | OROMUCOSAL | Status: AC

## 2021-09-26 SURGICAL SUPPLY — 30 items
CANISTER SUCT 3000ML PPV (MISCELLANEOUS) ×1 IMPLANT
COVER SURGICAL LIGHT HANDLE (MISCELLANEOUS) ×2 IMPLANT
ELECT COATED BLADE 2.86 ST (ELECTRODE) ×2 IMPLANT
ELECT NDL BLADE 2-5/6 (NEEDLE) IMPLANT
ELECT NEEDLE BLADE 2-5/6 (NEEDLE) ×2 IMPLANT
ELECT REM PT RETURN 9FT ADLT (ELECTROSURGICAL) ×2
ELECTRODE REM PT RTRN 9FT ADLT (ELECTROSURGICAL) ×1 IMPLANT
GAUZE 4X4 16PLY ~~LOC~~+RFID DBL (SPONGE) ×1 IMPLANT
GLOVE SURG ENC MOIS LTX SZ6.5 (GLOVE) ×2 IMPLANT
GOWN STRL REUS W/ TWL LRG LVL3 (GOWN DISPOSABLE) ×2 IMPLANT
GOWN STRL REUS W/TWL LRG LVL3 (GOWN DISPOSABLE) ×4
KIT BASIN OR (CUSTOM PROCEDURE TRAY) ×2 IMPLANT
KIT TURNOVER KIT B (KITS) ×2 IMPLANT
NDL 25GX 5/8IN NON SAFETY (NEEDLE) IMPLANT
NDL HYPO 30X.5 LL (NEEDLE) IMPLANT
NEEDLE 25GX 5/8IN NON SAFETY (NEEDLE) ×2 IMPLANT
NEEDLE HYPO 30X.5 LL (NEEDLE) ×2 IMPLANT
NS IRRIG 1000ML POUR BTL (IV SOLUTION) ×2 IMPLANT
PAD ARMBOARD 7.5X6 YLW CONV (MISCELLANEOUS) ×4 IMPLANT
PENCIL BUTTON HOLSTER BLD 10FT (ELECTRODE) ×2 IMPLANT
SUT CHROMIC 4 0 P 3 18 (SUTURE) ×2 IMPLANT
SUT ETHILON 5 0 P 3 18 (SUTURE) ×2
SUT NYLON ETHILON 5-0 P-3 1X18 (SUTURE) ×1 IMPLANT
SUT VIC AB 4-0 RB1 27 (SUTURE) ×2
SUT VIC AB 4-0 RB1 27X BRD (SUTURE) IMPLANT
SUT VICRYL 5 0 PS 3 18 (SUTURE) ×1 IMPLANT
SYR BULB IRRIG 60ML STRL (SYRINGE) ×1 IMPLANT
TOWEL GREEN STERILE FF (TOWEL DISPOSABLE) ×2 IMPLANT
TRAY ENT MC OR (CUSTOM PROCEDURE TRAY) ×2 IMPLANT
WATER STERILE IRR 1000ML POUR (IV SOLUTION) ×2 IMPLANT

## 2021-09-26 NOTE — Anesthesia Postprocedure Evaluation (Signed)
Anesthesia Post Note ? ?Patient: Alexander Schmitt ? ?Procedure(s) Performed: LESION EXCISION WITH COMPLEX REPAIR (Mouth) ? ?  ? ?Patient location during evaluation: PACU ?Anesthesia Type: General ?Level of consciousness: awake ?Pain management: pain level controlled ?Vital Signs Assessment: post-procedure vital signs reviewed and stable ?Respiratory status: spontaneous breathing and respiratory function stable ?Cardiovascular status: stable ?Postop Assessment: no apparent nausea or vomiting ?Anesthetic complications: no ? ? ?No notable events documented. ? ?Last Vitals:  ?Vitals:  ? 09/26/21 0821 09/26/21 0836  ?BP: (!) 155/92 (!) 146/86  ?Pulse: 91 72  ?Resp: 12 15  ?Temp: (!) 36.1 ?C (!) 36.2 ?C  ?SpO2: 96% 95%  ?  ?Last Pain:  ?Vitals:  ? 09/26/21 0836  ?TempSrc:   ?PainSc: 0-No pain  ? ? ?  ?  ?  ?  ?  ?  ? ?Merlinda Frederick ? ? ? ? ?

## 2021-09-26 NOTE — Transfer of Care (Signed)
Immediate Anesthesia Transfer of Care Note ? ?Patient: Alexander Schmitt ? ?Procedure(s) Performed: LESION EXCISION WITH COMPLEX REPAIR (Mouth) ? ?Patient Location: PACU ? ?Anesthesia Type:General ? ?Level of Consciousness: oriented, drowsy, patient cooperative and responds to stimulation ? ?Airway & Oxygen Therapy: Patient Spontanous Breathing and Patient connected to nasal cannula oxygen ? ?Post-op Assessment: Report given to RN, Post -op Vital signs reviewed and stable and Patient moving all extremities X 4 ? ?Post vital signs: Reviewed and stable ? ?Last Vitals:  ?Vitals Value Taken Time  ?BP 155/92 09/26/21 0820  ?Temp    ?Pulse 91 09/26/21 0821  ?Resp 12 09/26/21 0821  ?SpO2 89 % 09/26/21 0821  ?Vitals shown include unvalidated device data. ? ?Last Pain:  ?Vitals:  ? 09/26/21 0635  ?TempSrc:   ?PainSc: 0-No pain  ?   ? ?Patients Stated Pain Goal: 0 (09/26/21 4536) ? ?Complications: No notable events documented. ?

## 2021-09-26 NOTE — Discharge Instructions (Signed)
Wait 24 hr before drinking or eating hot food so you don't burn yourself. ?Lots of cold drinks like ensure for today. ?

## 2021-09-26 NOTE — Anesthesia Procedure Notes (Signed)
Procedure Name: Intubation ?Date/Time: 09/26/2021 7:24 AM ?Performed by: Claris Che, CRNA ?Pre-anesthesia Checklist: Patient identified, Emergency Drugs available, Suction available, Patient being monitored and Timeout performed ?Patient Re-evaluated:Patient Re-evaluated prior to induction ?Oxygen Delivery Method: Circle system utilized ?Preoxygenation: Pre-oxygenation with 100% oxygen ?Induction Type: IV induction and Cricoid Pressure applied ?Ventilation: Mask ventilation without difficulty ?Laryngoscope Size: Mac and 4 ?Grade View: Grade II ?Tube type: Oral ?Tube size: 8.0 mm ?Number of attempts: 1 ?Airway Equipment and Method: Stylet ?Placement Confirmation: ETT inserted through vocal cords under direct vision, positive ETCO2 and breath sounds checked- equal and bilateral ?Secured at: 23 cm ?Tube secured with: Tape ?Dental Injury: Teeth and Oropharynx as per pre-operative assessment  ? ? ? ? ?

## 2021-09-26 NOTE — Op Note (Signed)
DATE OF OPERATION: 09/26/2021 ? ?LOCATION: [facility] ? ?PREOPERATIVE DIAGNOSIS: complex laceration of upper lip ? ?POSTOPERATIVE DIAGNOSIS: Same ? ?PROCEDURE: complex repair of upper lip laceration with muscle and soft tissue repair 2 cm ? ?SURGEON: Kaity Pitstick Sanger Justene Jensen, DO ? ?ASSISTANT: Roetta Sessions, PA ? ?EBL: 1 cc ? ?CONDITION: Stable ? ?COMPLICATIONS: None ? ?INDICATION: The patient, Alexander Schmitt, is a 71 y.o. male born on [birth year], is here for treatment of a complex lip laceration.  ? ?PROCEDURE DETAILS:  ?The patient was seen prior to surgery and marked.  The IV antibiotics were given. The patient was taken to the operating room and given a general anesthetic. A standard time out was performed and all information was confirmed by those in the room. SCDs were placed.   The patient was prepped and draped.  Local with epinephrine was injected at the laceration site.  After waiting several minutes for the local to work the #15 blade was used to make the incision. The bovie was used to dissect to the muscle.  The incision was within the previous laceration site which was a curvilinear incision of the right upper lip flap 2 cm.  The bovie isolated the obicularis muscle which was release on the right and left side.  The scar tissue on the right was excised superficial to the muscle.  The muscle was then re-approximated with the 4-0 Vicryl with 4 sutures.  The right flap was trimmed and the flap brought back into place.  The skin was closed with the 4-0 and 5-0 Vicryl stitches. The patient was allowed to wake up and taken to recovery room in stable condition at the end of the case. The family was notified at the end of the case.  ? ?The advanced practice practitioner (APP) assisted throughout the case.  The APP was essential in retraction and counter traction when needed to make the case progress smoothly.  This retraction and assistance made it possible to see the tissue plans for the  procedure.  The assistance was needed for blood control, tissue re-approximation and assisted with closure of the incision site. ? ?

## 2021-09-26 NOTE — Interval H&P Note (Signed)
History and Physical Interval Note: ? ?09/26/2021 ?7:08 AM ? ?Alexander Schmitt  has presented today for surgery, with the diagnosis of Lip Laceration.  The various methods of treatment have been discussed with the patient and family. After consideration of risks, benefits and other options for treatment, the patient has consented to  Procedure(s): ?LESION EXCISION WITH COMPLEX REPAIR (N/A) as a surgical intervention.  The patient's history has been reviewed, patient examined, no change in status, stable for surgery.  I have reviewed the patient's chart and labs.  Questions were answered to the patient's satisfaction.   ? ? ?[name] ? ? ?

## 2021-09-26 NOTE — Progress Notes (Signed)
Patient discharged at this time patient was awaiting transportation patient alert talkative skin warm and dry respirations even unlabored ?

## 2021-09-27 ENCOUNTER — Encounter (HOSPITAL_COMMUNITY): Payer: Self-pay | Admitting: Plastic Surgery

## 2021-10-04 ENCOUNTER — Other Ambulatory Visit: Payer: Self-pay

## 2021-10-04 ENCOUNTER — Ambulatory Visit (INDEPENDENT_AMBULATORY_CARE_PROVIDER_SITE_OTHER): Payer: Medicare HMO | Admitting: Plastic Surgery

## 2021-10-04 ENCOUNTER — Encounter: Payer: Self-pay | Admitting: Plastic Surgery

## 2021-10-04 DIAGNOSIS — S01511A Laceration without foreign body of lip, initial encounter: Secondary | ICD-10-CM

## 2021-10-04 NOTE — Progress Notes (Signed)
The patient is a 71 year old male here for follow-up on his upper lip laceration repair.  He is doing a really good job keeping it clean and moisturized with the ointment.  The sutures are in place.  The swelling is still present but improving.  I am going to leave the sutures and let them work the way out.  I would like to see him back in 2 months. ? ?Pictures were obtained of the patient and placed in the chart with the patient's or guardian's permission. ? ?

## 2021-11-18 ENCOUNTER — Encounter (HOSPITAL_COMMUNITY): Payer: Self-pay | Admitting: Emergency Medicine

## 2021-11-18 ENCOUNTER — Other Ambulatory Visit: Payer: Self-pay

## 2021-11-18 ENCOUNTER — Emergency Department (HOSPITAL_COMMUNITY)
Admission: EM | Admit: 2021-11-18 | Discharge: 2021-11-19 | Disposition: A | Discharge status: Home / Self Care | Payer: Medicare HMO | Attending: Emergency Medicine | Admitting: Emergency Medicine

## 2021-11-18 DIAGNOSIS — F1020 Alcohol dependence, uncomplicated: Secondary | ICD-10-CM | POA: Diagnosis not present

## 2021-11-18 DIAGNOSIS — R7401 Elevation of levels of liver transaminase levels: Secondary | ICD-10-CM | POA: Insufficient documentation

## 2021-11-18 DIAGNOSIS — R140 Abdominal distension (gaseous): Secondary | ICD-10-CM | POA: Diagnosis present

## 2021-11-18 DIAGNOSIS — I10 Essential (primary) hypertension: Secondary | ICD-10-CM | POA: Insufficient documentation

## 2021-11-18 DIAGNOSIS — Z86010 Personal history of colonic polyps: Secondary | ICD-10-CM | POA: Diagnosis not present

## 2021-11-18 DIAGNOSIS — R911 Solitary pulmonary nodule: Secondary | ICD-10-CM | POA: Insufficient documentation

## 2021-11-18 DIAGNOSIS — Y903 Blood alcohol level of 60-79 mg/100 ml: Secondary | ICD-10-CM | POA: Insufficient documentation

## 2021-11-18 DIAGNOSIS — K7031 Alcoholic cirrhosis of liver with ascites: Secondary | ICD-10-CM | POA: Insufficient documentation

## 2021-11-18 LAB — URINALYSIS, ROUTINE W REFLEX MICROSCOPIC
Bilirubin Urine: NEGATIVE
Glucose, UA: NEGATIVE mg/dL
Hgb urine dipstick: NEGATIVE
Ketones, ur: NEGATIVE mg/dL
Leukocytes,Ua: NEGATIVE
Nitrite: NEGATIVE
Protein, ur: NEGATIVE mg/dL
Specific Gravity, Urine: 1.014 (ref 1.005–1.030)
pH: 5 (ref 5.0–8.0)

## 2021-11-18 LAB — CBC WITH DIFFERENTIAL/PLATELET
Abs Immature Granulocytes: 0.02 10*3/uL (ref 0.00–0.07)
Basophils Absolute: 0 10*3/uL (ref 0.0–0.1)
Basophils Relative: 1 %
Eosinophils Absolute: 0.1 10*3/uL (ref 0.0–0.5)
Eosinophils Relative: 1 %
HCT: 30.3 % — ABNORMAL LOW (ref 39.0–52.0)
Hemoglobin: 9.6 g/dL — ABNORMAL LOW (ref 13.0–17.0)
Immature Granulocytes: 0 %
Lymphocytes Relative: 25 %
Lymphs Abs: 1.6 10*3/uL (ref 0.7–4.0)
MCH: 32.5 pg (ref 26.0–34.0)
MCHC: 31.7 g/dL (ref 30.0–36.0)
MCV: 102.7 fL — ABNORMAL HIGH (ref 80.0–100.0)
Monocytes Absolute: 0.7 10*3/uL (ref 0.1–1.0)
Monocytes Relative: 11 %
Neutro Abs: 3.9 10*3/uL (ref 1.7–7.7)
Neutrophils Relative %: 62 %
Platelets: 178 10*3/uL (ref 150–400)
RBC: 2.95 MIL/uL — ABNORMAL LOW (ref 4.22–5.81)
RDW: 13.7 % (ref 11.5–15.5)
WBC: 6.2 10*3/uL (ref 4.0–10.5)
nRBC: 0 % (ref 0.0–0.2)

## 2021-11-18 LAB — PROTIME-INR
INR: 1.2 (ref 0.8–1.2)
Prothrombin Time: 15.4 seconds — ABNORMAL HIGH (ref 11.4–15.2)

## 2021-11-18 LAB — COMPREHENSIVE METABOLIC PANEL
ALT: 27 U/L (ref 0–44)
AST: 49 U/L — ABNORMAL HIGH (ref 15–41)
Albumin: 1.9 g/dL — ABNORMAL LOW (ref 3.5–5.0)
Alkaline Phosphatase: 140 U/L — ABNORMAL HIGH (ref 38–126)
Anion gap: 7 (ref 5–15)
BUN: <5 mg/dL — ABNORMAL LOW (ref 8–23)
CO2: 24 mmol/L (ref 22–32)
Calcium: 8.3 mg/dL — ABNORMAL LOW (ref 8.9–10.3)
Chloride: 105 mmol/L (ref 98–111)
Creatinine, Ser: 0.88 mg/dL (ref 0.61–1.24)
GFR, Estimated: >60 mL/min (ref >60)
Glucose, Bld: 142 mg/dL — ABNORMAL HIGH (ref 70–99)
Potassium: 3.6 mmol/L (ref 3.5–5.1)
Sodium: 136 mmol/L (ref 135–145)
Total Bilirubin: 1.2 mg/dL (ref 0.3–1.2)
Total Protein: 5.2 g/dL — ABNORMAL LOW (ref 6.5–8.1)

## 2021-11-18 LAB — ETHANOL: Alcohol, Ethyl (B): 71 mg/dL — ABNORMAL HIGH (ref <10)

## 2021-11-18 LAB — LIPASE, BLOOD: Lipase: 19 U/L (ref 11–51)

## 2021-11-18 LAB — APTT: aPTT: 31 seconds (ref 24–36)

## 2021-11-18 NOTE — ED Triage Notes (Signed)
Pt reported tp ED with c/o pain upper abdomen and swelling to BLE. Pt states he has ahx of liver cirrhosis and appears to be jaundice in color.  ?

## 2021-11-18 NOTE — ED Provider Triage Note (Addendum)
Emergency Medicine Provider Triage Evaluation Note ? ?[name] , a 71 y.o. male  was evaluated in triage.  Pt complains of abdominal pain and SOB.  States worsening for the past few days.  Stomach feels "swollen".  He does have history of cirrhosis, continues to drink alcohol but much less than before.  States he is not currently followed by GI.  States when walking a lot he gets very short of breath.  Denies chest pain. ? ?Review of Systems  ?Positive: Abdominal pain, SOB ?Negative: fever ? ?Physical Exam  ?BP 123/80 (BP Location: Left Arm)   Pulse 97   Temp 98.4 ?F (36.9 ?C) (Oral)   Resp 17   SpO2 94%  ?Gen:   Awake, no distress   ?Resp:  Normal effort  ?MSK:   Moves extremities without difficulty  ?Other:  Appears jaundiced, abdomen distended and firm but not rigid ? ?Medical Decision Making  ?Medically screening exam initiated at 10:06 PM.  Appropriate orders placed.  [name] was informed that the remainder of the evaluation will be completed by another provider, this initial triage assessment does not replace that evaluation, and the importance of remaining in the ED until their evaluation is complete. ? ?Abdominal pain, SOB.  Appears jaundiced on my exam.  Does have hx of cirrhosis and reports continued EtoH use.  Abdomen distended but no rigidity.  Will check labs, CT. ? ?1:25 AM ?CTAP without acute abdominal findings but does have spiculated lesion in the lung concerning for malignancy, recommended for CT chest for further evaluation.  This has been ordered. ?  ?Larene Pickett, PA-C ?11/18/21 2209 ? ?  ?Larene Pickett, PA-C ?11/19/21 0125 ? ?

## 2021-11-19 ENCOUNTER — Emergency Department (HOSPITAL_COMMUNITY): Payer: Medicare HMO

## 2021-11-19 DIAGNOSIS — K7031 Alcoholic cirrhosis of liver with ascites: Secondary | ICD-10-CM | POA: Diagnosis not present

## 2021-11-19 MED ORDER — DOXYCYCLINE HYCLATE 100 MG PO CAPS: 100.0000 mg | ORAL_CAPSULE | Freq: Two times a day (BID) | ORAL | 0 refills | Status: AC

## 2021-11-19 MED ORDER — ONDANSETRON HCL 4 MG/2ML IJ SOLN
4.0000 mg | Freq: Once | INTRAMUSCULAR | Status: AC
  Administered 2021-11-19: 4 mg via INTRAVENOUS
  Filled 2021-11-19: qty 2

## 2021-11-19 MED ORDER — MORPHINE SULFATE (PF) 4 MG/ML IV SOLN
4.0000 mg | Freq: Once | INTRAVENOUS | Status: AC
  Administered 2021-11-19: 4 mg via INTRAVENOUS
  Filled 2021-11-19: qty 1

## 2021-11-19 MED ORDER — IOHEXOL 300 MG/ML  SOLN
100.0000 mL | Freq: Once | INTRAMUSCULAR | Status: AC | PRN
  Administered 2021-11-19: 100 mL via INTRAVENOUS

## 2021-11-19 MED ORDER — IOHEXOL 300 MG/ML  SOLN
50.0000 mL | Freq: Once | INTRAMUSCULAR | Status: AC | PRN
  Administered 2021-11-19: 50 mL via INTRAVENOUS

## 2021-11-19 NOTE — ED Notes (Signed)
Patient verbalizes understanding of d/c instructions. Opportunities for questions and answers were provided. Pt d/c from ED and wheeled to lobby where friend is picking pt up.  

## 2021-11-19 NOTE — Discharge Instructions (Signed)
You were seen today for abdominal pain.  This is likely related to your known cirrhosis.  Of note, you had a pulmonary nodule on your CT scan.  You need to follow-up with oncology for further evaluation..  I have sent a message to our local oncologist.  If you do not hear back, follow-up with your primary physician for referral.  In the meantime, you will be treated with antibiotics to cover for any infectious component ?

## 2021-11-19 NOTE — ED Provider Notes (Signed)
?[facility] ?Provider Note ? ? ?CSN: [id number] ?Arrival date & time: 11/18/21  2039 ? ?  ? ?History ? ?Chief Complaint  ?Patient presents with  ? Abdominal Pain  ? ? ?Alexander Schmitt is a 71 y.o. male. ? ?HPI ? ?  ? ? ?This is a 71 year old male with a history of cirrhosis and alcohol abuse who presents with abdominal distention.  Patient reports several day history of worsening abdominal distention.  He denies significant pain but states he is uncomfortable especially when trying to have a bowel movement.  He has had some associated shortness of breath.  No fevers.  He continues to drink alcohol daily, does not follow with GI.  Has never had paracentesis.  Regarding his shortness of breath, he states that shortness of breath is worse on exertion.  No chest pain. ? ?Home Medications ?Prior to Admission medications   ?Medication Sig Start Date End Date Taking? Authorizing Provider  ?[employer] D3 25 MCG (1000 UT) capsule Take 1,000 Units by mouth daily. 05/21/18  Yes [provider]  ?doxycycline (VIBRAMYCIN) 100 MG capsule Take 1 capsule (100 mg total) by mouth 2 (two) times daily. 11/19/21  Yes Breionna Punt, Barbette Hair, MD  ?folic acid (FOLVITE) 1 MG tablet Take 1 mg by mouth daily. 05/21/18  Yes [provider]  ?ibuprofen (ADVIL) 200 MG tablet Take 400 mg by mouth every 6 (six) hours as needed for headache or moderate pain.   Yes [provider]  ?tamsulosin (FLOMAX) 0.4 MG CAPS capsule Take 0.4 mg by mouth daily after breakfast.   Yes Joline Salt, RN  ?thiamine (VITAMIN B-1) 100 MG tablet Take 100 mg by mouth daily.   Yes [provider]  ?   ? ?Allergies    ?Patient has no known allergies.   ? ?Review of Systems   ?Review of Systems  ?Constitutional:  Negative for fever.  ?Respiratory:  Positive for shortness of breath.   ?Gastrointestinal:  Positive for abdominal distention. Negative for nausea and vomiting.  ?All other systems reviewed and are  negative. ? ?Physical Exam ?Updated Vital Signs ?BP 119/71   Pulse 71   Temp 98.4 ?F (36.9 ?C) (Oral)   Resp 12   SpO2 100%  ?Physical Exam ?Vitals and nursing note reviewed.  ?Constitutional:   ?   Appearance: He is well-developed.  ?   Comments: Chronically ill-appearing but nontoxic  ?HENT:  ?   Head: Normocephalic and atraumatic.  ?Eyes:  ?   Pupils: Pupils are equal, round, and reactive to light.  ?   Comments: Sclerae icteric  ?Cardiovascular:  ?   Rate and Rhythm: Normal rate and regular rhythm.  ?   Heart sounds: Normal heart sounds. No murmur heard. ?Pulmonary:  ?   Effort: Pulmonary effort is normal. No respiratory distress.  ?   Breath sounds: Normal breath sounds. No wheezing.  ?Abdominal:  ?   General: Bowel sounds are normal. There is distension.  ?   Palpations: Abdomen is soft.  ?   Tenderness: There is abdominal tenderness. There is no guarding or rebound.  ?   Comments: Anasarca  ?Musculoskeletal:  ?   Cervical back: Neck supple.  ?   Right lower leg: Edema present.  ?   Left lower leg: Edema present.  ?   Comments: Bilateral lower extremity edema with pitting edema into the thighs  ?Lymphadenopathy:  ?   Cervical: No cervical adenopathy.  ?Skin: ?   General:  Skin is warm and dry.  ?Neurological:  ?   Mental Status: He is alert and oriented to person, place, and time.  ?Psychiatric:     ?   Mood and Affect: Mood normal.  ? ? ?ED Results / Procedures / Treatments   ?Labs ?(all labs ordered are listed, but only abnormal results are displayed) ?Labs Reviewed  ?CBC WITH DIFFERENTIAL/PLATELET - Abnormal; Notable for the following components:  ?    Result Value  ? RBC 2.95 (*)   ? Hemoglobin 9.6 (*)   ? HCT 30.3 (*)   ? MCV 102.7 (*)   ? All other components within normal limits  ?COMPREHENSIVE METABOLIC PANEL - Abnormal; Notable for the following components:  ? Glucose, Bld 142 (*)   ? BUN <5 (*)   ? Calcium 8.3 (*)   ? Total Protein 5.2 (*)   ? Albumin 1.9 (*)   ? AST 49 (*)   ? Alkaline  Phosphatase 140 (*)   ? All other components within normal limits  ?PROTIME-INR - Abnormal; Notable for the following components:  ? Prothrombin Time 15.4 (*)   ? All other components within normal limits  ?ETHANOL - Abnormal; Notable for the following components:  ? Alcohol, Ethyl (B) 71 (*)   ? All other components within normal limits  ?URINALYSIS, ROUTINE W REFLEX MICROSCOPIC - Abnormal; Notable for the following components:  ? Color, Urine AMBER (*)   ? All other components within normal limits  ?LIPASE, BLOOD  ?APTT  ? ? ?EKG ?None ? ?Radiology ?CT Chest W Contrast ? ?Result Date: 11/19/2021 ?CLINICAL DATA:  Right middle lobe 1.8 cm suspected nodular opacity on abdomen and pelvis CT today. EXAM: CT CHEST WITH CONTRAST TECHNIQUE: Multidetector CT imaging of the chest was performed during intravenous contrast administration. RADIATION DOSE REDUCTION: This exam was performed according to the departmental dose-optimization program which includes automated exposure control, adjustment of the mA and/or kV according to patient size and/or use of iterative reconstruction technique. CONTRAST:  35m OMNIPAQUE IOHEXOL 300 MG/ML  SOLN COMPARISON:  Abdomen pelvis CT today and 09/02/2015. FINDINGS: Cardiovascular: The cardiac size is normal. There is a minimal pericardial effusion. There are moderate to heavy three-vessel coronary artery calcifications. There is aortic atherosclerosis extending into the branch vessels without aneurysm, penetrating ulcer or dissection. The pulmonary veins are decompressed. Pulmonary arteries are normal caliber and centrally clear. Mediastinum/Nodes: There are scattered borderline to slightly prominent hilar lymph nodes up to 1 cm in short axis. There is no further intrathoracic adenopathy. The thyroid gland, axillary spaces are unremarkable. Esophagus and trachea are unremarkable. Lungs/Pleura: There are mild paraseptal and centrilobular emphysematous changes in the upper lobes with symmetric  apical scar-like opacities. There is small left and trace right layering pleural effusions. There is an opacity measuring 3.1 x 3.0 x 1.2 cm in the right middle lobe with stranding to the pleural surfaces. This is only partially visible on the abdomen and pelvis CT. There air bronchograms running through this and surrounding patchy hazy interstitial changes with interstitial coarsening. There is additional minimal ground-glass disease in the right upper lobe perihilar area. Central airways remaining lungs are clear. Upper Abdomen: There is moderate free ascites and moderate to severe hepatic steatosis, no enlargement of the spleen. Redemonstrated findings described in the abdomen and pelvis CT report. Musculoskeletal: There is mild osteopenia. There are mild degenerative changes of the spine. IMPRESSION: 1. 3.1 x 3.0 x 1.2 cm right middle lobe rounded opacity with surrounding interstitial and  hazy opacities. 2. The opacity contains air bronchograms and may represent a rounded pneumonia, with additional right upper lobe perihilar ground-glass disease concerning for multilobar pneumonia. Bronchoalveolar carcinoma is also in the differential. Follow-up as indicated or obtain PET-CT. 3. Background COPD changes, small left, minimal right pleural effusions. 4. Borderline and slightly prominent right hilar nodes probably reactive. 5. Aortic and coronary artery atherosclerosis. 6. Moderate free ascites in the upper abdomen. Electronically Signed   By: Telford Nab M.D.   On: 11/19/2021 04:57  ? ?CT ABDOMEN PELVIS W CONTRAST ? ?Result Date: 11/19/2021 ?CLINICAL DATA:  Abdominal pain and jaundice, initial encounter EXAM: CT ABDOMEN AND PELVIS WITH CONTRAST TECHNIQUE: Multidetector CT imaging of the abdomen and pelvis was performed using the standard protocol following bolus administration of intravenous contrast. RADIATION DOSE REDUCTION: This exam was performed according to the departmental dose-optimization program which  includes automated exposure control, adjustment of the mA and/or kV according to patient size and/or use of iterative reconstruction technique. CONTRAST:  158m OMNIPAQUE IOHEXOL 300 MG/ML  SOLN COMPARISON:  02/26/2

## 2021-11-19 NOTE — ED Notes (Signed)
Patient transported to CT 

## 2021-11-22 ENCOUNTER — Telehealth: Payer: Self-pay | Admitting: Oncology

## 2021-11-22 NOTE — Telephone Encounter (Signed)
This encounter was created in error - please disregard.

## 2021-11-25 ENCOUNTER — Telehealth: Payer: Self-pay | Admitting: Pulmonary Disease

## 2021-11-25 NOTE — Telephone Encounter (Signed)
Left voicemail for patient to call back to schedule first available with Dr. Valeta Harms or Dr. Lamonte Sakai in nodule slot.

## 2021-11-25 NOTE — Telephone Encounter (Signed)
-----   Message from Garner Nash, DO sent at 11/25/2021  7:54 AM EDT ----- Regarding: FW: Patient follow-up  Mel Almond,   Please place in nodule slot next available for RB or BI  Thanks  BLI    ----- Message ----- From: Curt Bears, MD Sent: 11/19/2021   9:09 AM EDT To: Collene Gobble, MD, Heath Lark, MD, # Subject: RE: Patient follow-up                          I think the best option for him right now is pulmonary evaluation and follow-up for a pulmonary nodule.  Dr. Lamonte Sakai and Dr. Valeta Harms are very good on keeping an eye on these patient.  Thank you. ----- Message ----- From: Heath Lark, MD Sent: 11/19/2021   8:09 AM EDT To: Curt Bears, MD Subject: FW: Patient follow-up                          Hi Mohamed,  Good morning I was on call yesterday and received this message this morning Would you be able to see this patient or should I send her to [place] for work-up? Please let me know  Thanks, Ni ----- Message ----- From: Merryl Hacker, MD Sent: 11/19/2021   6:14 AM EDT To: Heath Lark, MD Subject: Patient follow-up                              Hi Dr. Alvy Bimler,   Patient attached was found to have a pulmonary nodule.  There are some indications that there may be an infectious component.  I did discharge him on doxycycline.  Carcinoma is also consideration.  Was wondering if you could get him in for follow-up.  C.Horton

## 2021-12-04 NOTE — Telephone Encounter (Signed)
Left voicemail for patient to call back to schedule first available with Dr. Valeta Harms or Dr. Lamonte Sakai in nodule slot. Per policy- closing encounter.

## 2021-12-06 ENCOUNTER — Ambulatory Visit: Payer: Medicare HMO | Admitting: Physician Assistant

## 2021-12-18 ENCOUNTER — Ambulatory Visit (INDEPENDENT_AMBULATORY_CARE_PROVIDER_SITE_OTHER): Payer: Medicare HMO | Admitting: Emergency Medicine

## 2021-12-18 ENCOUNTER — Encounter: Payer: Self-pay | Admitting: Emergency Medicine

## 2021-12-18 DIAGNOSIS — R0600 Dyspnea, unspecified: Secondary | ICD-10-CM | POA: Insufficient documentation

## 2021-12-18 DIAGNOSIS — R9389 Abnormal findings on diagnostic imaging of other specified body structures: Secondary | ICD-10-CM | POA: Diagnosis not present

## 2021-12-18 DIAGNOSIS — R0609 Other forms of dyspnea: Secondary | ICD-10-CM | POA: Diagnosis not present

## 2021-12-18 NOTE — Assessment & Plan Note (Signed)
Rounded opacity in the right middle lobe with air bronchogram, question rounded atelectasis.  Certainly must consider primary malignancy although somewhat atypical appearance.  He will need serial imaging.  Will plan for repeat CT chest in 3 months, also perform PET scan at that time to help [place] assess risk/suspicion for malignancy.  He understands the plans, that we may decide to pursue bronchoscopy and a tissue diagnosis depending on the results.

## 2021-12-18 NOTE — Progress Notes (Signed)
Subjective:    Patient ID: Alexander Schmitt, male    DOB: 1950-07-29, 71 y.o.   MRN: [id number]  HPI 71 year old former smoker (20 pack years) with a history of alcohol use and alcoholic cirrhosis, PUD/GERD, hypertension, chronic pancreatitis, gastric varices.  He was seen in the ED 11/19/2021 for abdominal distention and edema.  He had moderate ascites but no paracentesis was possible.  Part of his evaluation included a CT scan of the chest that showed infiltrate versus pulmonary nodule.  He was treated with doxycycline and referred here and to oncology for further evaluation. He reports that he is still having some abd distension, significant LE edema. He has exertional SOB. No wheeze. Weight is overall stable. No cough, no hemoptysis.   CT scan of the chest 11/19/2021 reviewed by me shows scattered borderline prominent hilar nodes, paraseptal and centrilobular emphysema, 3.1 x 3.0 x 1.2 right middle lobe opacity that strands to the pleural surface with air bronchograms present.  Minimal groundglass in the perihilar right upper lobe    Review of Systems As p[er HPI  Past Medical History:  Diagnosis Date   Alcoholism ([place]) since before 2013   Anemia 08/2015   PRBC x 2 units 08/2015.    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   Upper GI bleed 08/2015   suspected gastric varices;      Family History  Problem Relation Age of Onset   Hypertension Father    Colon cancer Neg Hx      Social History   Socioeconomic History    Marital status: Single    Spouse name: Not on file   Number of children: Not on file   Years of education: Not on file   Highest education level: Not on file  Occupational History   Not on file  Tobacco Use   Smoking status: Former    Packs/day: 0.25    Years: 40.00    Total pack years: 10.00    Types: Cigarettes   Smokeless tobacco: Never  Vaping Use   Vaping Use: Never used  Substance and Sexual Activity   Alcohol use: Not Currently    Alcohol/week: 0.0 standard drinks of alcohol   Drug use: No   Sexual activity: Yes    Birth control/protection: None  Other Topics Concern   Not on file  Social History Narrative   Not on file   Social Determinants of Health   Financial Resource Strain: Not on file  Food Insecurity: Not on file  Transportation Needs: Not on file  Physical Activity: Not on file  Stress: Not on file  Social Connections: Not on file  Intimate Partner Violence: Not on file     No Known Allergies   Outpatient Medications Prior to Visit  Medication Sig Dispense Refill   folic acid (FOLVITE) 1 MG tablet Take 1 mg by mouth daily.     tamsulosin (FLOMAX) 0.4 MG CAPS capsule Take 0.4 mg by mouth daily after breakfast.  thiamine (VITAMIN B-1) 100 MG tablet Take 100 mg by mouth daily.     [employer] D3 25 MCG (1000 UT) capsule Take 1,000 Units by mouth daily.     doxycycline (VIBRAMYCIN) 100 MG capsule Take 1 capsule (100 mg total) by mouth 2 (two) times daily. 20 capsule 0   ibuprofen (ADVIL) 200 MG tablet Take 400 mg by mouth every 6 (six) hours as needed for headache or moderate pain.     No facility-administered medications prior to visit.        Objective:   Physical Exam Vitals:   12/18/21 1036  BP: 134/80  Pulse: 96  Temp: 97.6 F (36.4 C)  TempSrc: Oral  SpO2: 96%  Weight: 169 lb (76.7 kg)  Height: 6' (1.829 m)   Gen: Pleasant, well-nourished, in no distress,  normal affect  ENT: No lesions, poor dentition,  oropharynx clear, no postnasal  drip, mild icterus  Neck: No JVD, no stridor  Lungs: No use of accessory muscles, distant, somewhat small volumes, no wheeze  Cardiovascular: RRR, heart sounds normal, no murmur or gallops, 3+ lower extremity peripheral edema  Musculoskeletal: No deformities, no cyanosis or clubbing  Neuro: alert, awake, non focal  Skin: Warm, no lesions or rash     Assessment & Plan:  Abnormal CT of the chest Rounded opacity in the right middle lobe with air bronchogram, question rounded atelectasis.  Certainly must consider primary malignancy although somewhat atypical appearance.  He will need serial imaging.  Will plan for repeat CT chest in 3 months, also perform PET scan at that time to help [place] assess risk/suspicion for malignancy.  He understands the plans, that we may decide to pursue bronchoscopy and a tissue diagnosis depending on the results.  Dyspnea Exertional shortness of breath, may be multifactorial but he is at risk for COPD, does have emphysematous changes on his CT chest.  He may benefit from BD therapy.  We will obtain pulmonary function testing.  At risk for hypoxemia, will perform walking oximetry today.  May be a contributor to [facility] or his lower extremity edema although suspect that this is due to his liver disease.   Baltazar Apo, MD, PhD 12/18/2021, 11:03 AM [facility] [phone] or if no answer before 7:00PM call [phone] For any issues after 7:00PM please call eLink [phone]

## 2021-12-18 NOTE — Assessment & Plan Note (Signed)
Exertional shortness of breath, may be multifactorial but he is at risk for COPD, does have emphysematous changes on his CT chest.  He may benefit from BD therapy.  We will obtain pulmonary function testing.  At risk for hypoxemia, will perform walking oximetry today.  May be a contributor to [facility] or his lower extremity edema although suspect that this is due to his liver disease.

## 2021-12-18 NOTE — Addendum Note (Signed)
Addended by: Gavin Potters R on: 12/18/2021 11:19 AM   Modules accepted: Orders

## 2021-12-18 NOTE — Patient Instructions (Signed)
We will perform a repeat CT scan/PET scan in August 2023 to follow a right mid lung opacity for stability. We will perform pulmonary function testing in next office visit We will perform walking oximetry today on room air Follow with Dr. Lamonte Sakai in August 2023 to review your scans and pulmonary function testing.

## 2021-12-20 ENCOUNTER — Encounter (HOSPITAL_COMMUNITY): Payer: Self-pay

## 2021-12-20 ENCOUNTER — Emergency Department (HOSPITAL_COMMUNITY)
Admission: EM | Admit: 2021-12-20 | Discharge: 2021-12-20 | Disposition: A | Discharge status: Home / Self Care | Payer: Medicare HMO | Attending: Emergency Medicine | Admitting: Emergency Medicine

## 2021-12-20 ENCOUNTER — Emergency Department (HOSPITAL_COMMUNITY): Payer: Medicare HMO

## 2021-12-20 DIAGNOSIS — R109 Unspecified abdominal pain: Secondary | ICD-10-CM | POA: Diagnosis not present

## 2021-12-20 DIAGNOSIS — R600 Localized edema: Secondary | ICD-10-CM | POA: Diagnosis present

## 2021-12-20 DIAGNOSIS — R0600 Dyspnea, unspecified: Secondary | ICD-10-CM | POA: Diagnosis not present

## 2021-12-20 DIAGNOSIS — M7989 Other specified soft tissue disorders: Secondary | ICD-10-CM | POA: Insufficient documentation

## 2021-12-20 DIAGNOSIS — R609 Edema, unspecified: Secondary | ICD-10-CM

## 2021-12-20 LAB — COMPREHENSIVE METABOLIC PANEL
ALT: 28 U/L (ref 0–44)
AST: 59 U/L — ABNORMAL HIGH (ref 15–41)
Albumin: 1.9 g/dL — ABNORMAL LOW (ref 3.5–5.0)
Alkaline Phosphatase: 155 U/L — ABNORMAL HIGH (ref 38–126)
Anion gap: 3 — ABNORMAL LOW (ref 5–15)
BUN: <5 mg/dL — ABNORMAL LOW (ref 8–23)
CO2: 28 mmol/L (ref 22–32)
Calcium: 8.2 mg/dL — ABNORMAL LOW (ref 8.9–10.3)
Chloride: 108 mmol/L (ref 98–111)
Creatinine, Ser: 0.67 mg/dL (ref 0.61–1.24)
GFR, Estimated: >60 mL/min (ref >60)
Glucose, Bld: 119 mg/dL — ABNORMAL HIGH (ref 70–99)
Potassium: 3.5 mmol/L (ref 3.5–5.1)
Sodium: 139 mmol/L (ref 135–145)
Total Bilirubin: 0.8 mg/dL (ref 0.3–1.2)
Total Protein: 5.9 g/dL — ABNORMAL LOW (ref 6.5–8.1)

## 2021-12-20 LAB — CBC WITH DIFFERENTIAL/PLATELET
Abs Immature Granulocytes: 0.01 10*3/uL (ref 0.00–0.07)
Basophils Absolute: 0 10*3/uL (ref 0.0–0.1)
Basophils Relative: 0 %
Eosinophils Absolute: 0.1 10*3/uL (ref 0.0–0.5)
Eosinophils Relative: 1 %
HCT: 33.6 % — ABNORMAL LOW (ref 39.0–52.0)
Hemoglobin: 10.6 g/dL — ABNORMAL LOW (ref 13.0–17.0)
Immature Granulocytes: 0 %
Lymphocytes Relative: 36 %
Lymphs Abs: 1.8 10*3/uL (ref 0.7–4.0)
MCH: 29.9 pg (ref 26.0–34.0)
MCHC: 31.5 g/dL (ref 30.0–36.0)
MCV: 94.6 fL (ref 80.0–100.0)
Monocytes Absolute: 0.4 10*3/uL (ref 0.1–1.0)
Monocytes Relative: 9 %
Neutro Abs: 2.7 10*3/uL (ref 1.7–7.7)
Neutrophils Relative %: 54 %
Platelets: 218 10*3/uL (ref 150–400)
RBC: 3.55 MIL/uL — ABNORMAL LOW (ref 4.22–5.81)
RDW: 13.2 % (ref 11.5–15.5)
WBC: 5.1 10*3/uL (ref 4.0–10.5)
nRBC: 0 % (ref 0.0–0.2)

## 2021-12-20 LAB — BRAIN NATRIURETIC PEPTIDE: B Natriuretic Peptide: 85 pg/mL (ref 0.0–100.0)

## 2021-12-20 MED ORDER — POTASSIUM CHLORIDE ER 10 MEQ PO TBCR: 10.0000 meq | EXTENDED_RELEASE_TABLET | Freq: Every day | ORAL | 0 refills | Status: AC

## 2021-12-20 MED ORDER — SODIUM CHLORIDE 0.9 % IV SOLN: INTRAVENOUS | Status: DC

## 2021-12-20 MED ORDER — FUROSEMIDE 20 MG PO TABS: 20.0000 mg | ORAL_TABLET | Freq: Every day | ORAL | 0 refills | Status: AC

## 2021-12-20 NOTE — ED Triage Notes (Signed)
Pt presents with c/o stomach, leg, feet swelling x2 months. Some worsening last few days. States worse after recent surgery. SHOB with exertion.

## 2021-12-20 NOTE — ED Provider Notes (Addendum)
[facility] DEPT Provider Note   CSN: [id number] Arrival date & time: 12/20/21  7588     History  Chief Complaint  Patient presents with   Abdominal Pain   Leg Swelling    VEDANSH KERSTETTER is a 71 y.o. male.  71 year old male presents with 2 months of lower extremity edema.  Notes slight increase dyspnea on exertion.  No fever or chills.  No vomiting noted.  Patient does consume copious amount of alcohol daily.  Denies any black or bloody stools.  Hematemesis.  Was supposed to see her doctor yesterday but did not.  No treatment use for this prior to arrival       Home Medications Prior to Admission medications   Medication Sig Start Date End Date Taking? Authorizing Provider  [employer] D3 25 MCG (1000 UT) capsule Take 1,000 Units by mouth daily. 05/21/18   [provider]  doxycycline (VIBRAMYCIN) 100 MG capsule Take 1 capsule (100 mg total) by mouth 2 (two) times daily. 11/19/21   Horton, Barbette Hair, MD  folic acid (FOLVITE) 1 MG tablet Take 1 mg by mouth daily. 05/21/18   [provider]  ibuprofen (ADVIL) 200 MG tablet Take 400 mg by mouth every 6 (six) hours as needed for headache or moderate pain.    [provider]  tamsulosin (FLOMAX) 0.4 MG CAPS capsule Take 0.4 mg by mouth daily after breakfast.    Joline Salt, RN  thiamine (VITAMIN B-1) 100 MG tablet Take 100 mg by mouth daily.    [provider]      Allergies    Patient has no known allergies.    Review of Systems   Review of Systems  All other systems reviewed and are negative.   Physical Exam Updated Vital Signs BP (!) 157/101 (BP Location: Left Arm)   Pulse 100   Temp 97.9 F (36.6 C) (Oral)   Resp 18   Ht 1.829 m (6')   SpO2 95%   BMI 22.92 kg/m  Physical Exam Vitals and nursing note reviewed.  Constitutional:      General: He is not in acute distress.    Appearance: Normal appearance. He is well-developed. He is not  toxic-appearing.  HENT:     Head: Normocephalic and atraumatic.  Eyes:     General: Lids are normal.     Conjunctiva/sclera: Conjunctivae normal.     Pupils: Pupils are equal, round, and reactive to light.  Neck:     Thyroid: No thyroid mass.     Trachea: No tracheal deviation.  Cardiovascular:     Rate and Rhythm: Normal rate and regular rhythm.     Heart sounds: Normal heart sounds. No murmur heard.    No gallop.  Pulmonary:     Effort: Pulmonary effort is normal. No respiratory distress.     Breath sounds: Normal breath sounds. No stridor. No decreased breath sounds, wheezing, rhonchi or rales.  Abdominal:     General: There is distension.     Palpations: Abdomen is soft.     Tenderness: There is no abdominal tenderness. There is no rebound.  Musculoskeletal:        General: No tenderness. Normal range of motion.     Cervical back: Normal range of motion and neck supple.     Comments: 3+ bilateral lower extremity edema  Skin:    General: Skin is warm and dry.     Findings: No abrasion or rash.  Neurological:  Mental Status: He is alert and oriented to person, place, and time. Mental status is at baseline.     GCS: GCS eye subscore is 4. GCS verbal subscore is 5. GCS motor subscore is 6.     Cranial Nerves: No cranial nerve deficit.     Sensory: No sensory deficit.     Motor: Motor function is intact.  Psychiatric:        Attention and Perception: Attention normal.        Speech: Speech normal.        Behavior: Behavior normal.     ED Results / Procedures / Treatments   Labs (all labs ordered are listed, but only abnormal results are displayed) Labs Reviewed  CBC WITH DIFFERENTIAL/PLATELET  COMPREHENSIVE METABOLIC PANEL  BRAIN NATRIURETIC PEPTIDE    EKG EKG Interpretation  Date/Time:  Friday December 20 2021 09:58:12 EDT Ventricular Rate:  69 PR Interval:  148 QRS Duration: 90 QT Interval:  410 QTC Calculation: 440 R Axis:   19 Text Interpretation: Sinus  rhythm Multiple premature complexes, vent & supraven Low voltage, extremity and precordial leads Confirmed by Lacretia Leigh ([id number]) on 12/20/2021 10:33:51 AM  Radiology No results found.  Procedures Procedures    Medications Ordered in ED Medications  0.9 %  sodium chloride infusion (has no administration in time range)    ED Course/ Medical Decision Making/ A&P                           Medical Decision Making Amount and/or Complexity of Data Reviewed Labs: ordered. Radiology: ordered. ECG/medicine tests: ordered.  Risk Prescription drug management.   Patient is EKG per my interpretation showed no sign of acute ischemic changes.  Chest x-ray per my interpretation showed no evidence of fluid overload.  BNP negative.  Do not feel that patient is in CHF.  Patient clinically has cirrhosis.  Upon further questioning, patient does admit to seeing a physician and being told the same.  He has no evidence of hepatic encephalopathy.  Patient's liver function test are not significantly abnormal.  He has no elevated white count on his CBC.  Plan will be to give patient prescription for Lasix as well as for potassium supplementation.  Of note his K here was 3.5.  Patient also has low albumin likely due to his underlying liver disease.  Suspect this is the etiology of his edema.  He will follow-up with his doctor        Final Clinical Impression(s) / ED Diagnoses Final diagnoses:  None    Rx / DC Orders ED Discharge Orders     None         Lacretia Leigh, MD 12/20/21 1135    Lacretia Leigh, MD 12/20/21 1136

## 2022-02-17 ENCOUNTER — Ambulatory Visit (HOSPITAL_COMMUNITY): Payer: Medicare Other

## 2022-02-17 ENCOUNTER — Encounter (HOSPITAL_COMMUNITY): Payer: Medicare Other

## 2022-02-17 ENCOUNTER — Other Ambulatory Visit (HOSPITAL_COMMUNITY): Payer: Medicare HMO

## 2022-04-16 ENCOUNTER — Ambulatory Visit: Payer: Medicare Other | Admitting: Emergency Medicine

## 2023-03-20 ENCOUNTER — Encounter: Payer: Medicare Other | Admitting: Gastroenterology
